# Patient Record
Sex: Male | Born: 1960 | Race: Black or African American | Hispanic: No | Marital: Single | Smoking: Current every day smoker
Health system: Southern US, Community
[De-identification: ages and names within clinical notes are randomized; demographics above are authoritative.]

## PROBLEM LIST (undated history)

## (undated) ENCOUNTER — Emergency Department (HOSPITAL_COMMUNITY): Admission: EM | Payer: Self-pay

## (undated) DIAGNOSIS — Q159 Congenital malformation of eye, unspecified: Secondary | ICD-10-CM

## (undated) DIAGNOSIS — G709 Myoneural disorder, unspecified: Secondary | ICD-10-CM

## (undated) DIAGNOSIS — I10 Essential (primary) hypertension: Secondary | ICD-10-CM

## (undated) DIAGNOSIS — Z9181 History of falling: Secondary | ICD-10-CM

## (undated) DIAGNOSIS — K219 Gastro-esophageal reflux disease without esophagitis: Secondary | ICD-10-CM

## (undated) DIAGNOSIS — F1010 Alcohol abuse, uncomplicated: Secondary | ICD-10-CM

## (undated) DIAGNOSIS — R569 Unspecified convulsions: Secondary | ICD-10-CM

## (undated) HISTORY — PX: NO PAST SURGERIES: SHX2092

## (undated) HISTORY — DX: History of falling: Z91.81

## (undated) HISTORY — DX: Myoneural disorder, unspecified: G70.9

## (undated) HISTORY — DX: Gastro-esophageal reflux disease without esophagitis: K21.9

---

## 1997-12-01 ENCOUNTER — Emergency Department (HOSPITAL_COMMUNITY): Admission: EM | Admit: 1997-12-01 | Discharge: 1997-12-01 | Payer: Self-pay | Admitting: Emergency Medicine

## 1997-12-15 ENCOUNTER — Emergency Department (HOSPITAL_COMMUNITY): Admission: EM | Admit: 1997-12-15 | Discharge: 1997-12-15 | Payer: Self-pay | Admitting: Emergency Medicine

## 1998-03-16 ENCOUNTER — Emergency Department (HOSPITAL_COMMUNITY): Admission: EM | Admit: 1998-03-16 | Discharge: 1998-03-16 | Payer: Self-pay | Admitting: Emergency Medicine

## 1998-05-10 ENCOUNTER — Emergency Department (HOSPITAL_COMMUNITY): Admission: EM | Admit: 1998-05-10 | Discharge: 1998-05-10 | Payer: Self-pay | Admitting: *Deleted

## 1998-08-15 ENCOUNTER — Emergency Department (HOSPITAL_COMMUNITY): Admission: EM | Admit: 1998-08-15 | Discharge: 1998-08-15 | Payer: Self-pay | Admitting: Emergency Medicine

## 2001-02-04 ENCOUNTER — Emergency Department (HOSPITAL_COMMUNITY): Admission: EM | Admit: 2001-02-04 | Discharge: 2001-02-04 | Payer: Self-pay | Admitting: Emergency Medicine

## 2001-02-04 ENCOUNTER — Encounter: Payer: Self-pay | Admitting: Emergency Medicine

## 2002-01-12 ENCOUNTER — Emergency Department (HOSPITAL_COMMUNITY): Admission: EM | Admit: 2002-01-12 | Discharge: 2002-01-12 | Payer: Self-pay

## 2002-12-01 ENCOUNTER — Emergency Department (HOSPITAL_COMMUNITY): Admission: EM | Admit: 2002-12-01 | Discharge: 2002-12-01 | Payer: Self-pay | Admitting: Emergency Medicine

## 2003-04-26 ENCOUNTER — Emergency Department (HOSPITAL_COMMUNITY): Admission: EM | Admit: 2003-04-26 | Discharge: 2003-04-26 | Payer: Self-pay | Admitting: *Deleted

## 2004-08-23 ENCOUNTER — Emergency Department (HOSPITAL_COMMUNITY): Admission: EM | Admit: 2004-08-23 | Discharge: 2004-08-23 | Payer: Self-pay | Admitting: Family Medicine

## 2004-11-01 ENCOUNTER — Emergency Department (HOSPITAL_COMMUNITY): Admission: EM | Admit: 2004-11-01 | Discharge: 2004-11-01 | Payer: Self-pay | Admitting: Emergency Medicine

## 2005-11-14 ENCOUNTER — Emergency Department (HOSPITAL_COMMUNITY): Admission: EM | Admit: 2005-11-14 | Discharge: 2005-11-14 | Payer: Self-pay | Admitting: Emergency Medicine

## 2006-11-03 ENCOUNTER — Ambulatory Visit: Payer: Self-pay | Admitting: Internal Medicine

## 2006-11-04 ENCOUNTER — Ambulatory Visit: Payer: Self-pay | Admitting: *Deleted

## 2007-03-26 ENCOUNTER — Emergency Department (HOSPITAL_COMMUNITY): Admission: EM | Admit: 2007-03-26 | Discharge: 2007-03-26 | Payer: Self-pay | Admitting: Family Medicine

## 2007-06-08 ENCOUNTER — Ambulatory Visit: Payer: Self-pay | Admitting: Internal Medicine

## 2007-06-11 ENCOUNTER — Ambulatory Visit (HOSPITAL_COMMUNITY): Admission: RE | Admit: 2007-06-11 | Discharge: 2007-06-11 | Payer: Self-pay | Admitting: Internal Medicine

## 2007-06-14 ENCOUNTER — Ambulatory Visit: Payer: Self-pay | Admitting: Internal Medicine

## 2007-06-14 LAB — CONVERTED CEMR LAB
BUN: 6 mg/dL (ref 6–23)
Calcium: 8.5 mg/dL (ref 8.4–10.5)
Cholesterol: 153 mg/dL (ref 0–200)
Creatinine, Ser: 0.71 mg/dL (ref 0.40–1.50)
Glucose, Bld: 87 mg/dL (ref 70–99)
Potassium: 3.4 meq/L — ABNORMAL LOW (ref 3.5–5.3)
Total CHOL/HDL Ratio: 3.8
Triglycerides: 135 mg/dL (ref <150)
VLDL: 27 mg/dL (ref 0–40)

## 2007-10-27 ENCOUNTER — Ambulatory Visit: Payer: Self-pay | Admitting: Internal Medicine

## 2008-04-04 ENCOUNTER — Ambulatory Visit: Payer: Self-pay | Admitting: Internal Medicine

## 2008-07-08 ENCOUNTER — Emergency Department (HOSPITAL_COMMUNITY): Admission: EM | Admit: 2008-07-08 | Discharge: 2008-07-08 | Payer: Self-pay | Admitting: Emergency Medicine

## 2008-12-19 ENCOUNTER — Ambulatory Visit: Payer: Self-pay | Admitting: Internal Medicine

## 2009-01-16 ENCOUNTER — Ambulatory Visit: Payer: Self-pay | Admitting: Internal Medicine

## 2009-01-21 ENCOUNTER — Ambulatory Visit (HOSPITAL_COMMUNITY): Admission: RE | Admit: 2009-01-21 | Discharge: 2009-01-21 | Payer: Self-pay | Admitting: Internal Medicine

## 2009-01-28 ENCOUNTER — Ambulatory Visit: Payer: Self-pay | Admitting: Internal Medicine

## 2009-02-04 ENCOUNTER — Ambulatory Visit: Payer: Self-pay | Admitting: Family Medicine

## 2009-03-08 ENCOUNTER — Ambulatory Visit (HOSPITAL_COMMUNITY): Admission: RE | Admit: 2009-03-08 | Discharge: 2009-03-08 | Payer: Self-pay | Admitting: Internal Medicine

## 2009-03-13 ENCOUNTER — Ambulatory Visit: Payer: Self-pay | Admitting: Family Medicine

## 2009-05-10 ENCOUNTER — Ambulatory Visit: Payer: Self-pay | Admitting: Internal Medicine

## 2009-06-27 ENCOUNTER — Ambulatory Visit: Payer: Self-pay | Admitting: Family Medicine

## 2009-06-28 ENCOUNTER — Encounter (INDEPENDENT_AMBULATORY_CARE_PROVIDER_SITE_OTHER): Payer: Self-pay | Admitting: Internal Medicine

## 2009-06-28 LAB — CONVERTED CEMR LAB
BUN: 11 mg/dL (ref 6–23)
Calcium: 9.6 mg/dL (ref 8.4–10.5)
Chloride: 95 meq/L — ABNORMAL LOW (ref 96–112)
Creatinine, Ser: 0.85 mg/dL (ref 0.40–1.50)
LDL Cholesterol: 116 mg/dL — ABNORMAL HIGH (ref 0–99)
Potassium: 4.4 meq/L (ref 3.5–5.3)
VLDL: 18 mg/dL (ref 0–40)

## 2009-08-14 ENCOUNTER — Ambulatory Visit: Payer: Self-pay | Admitting: Internal Medicine

## 2009-08-14 LAB — CONVERTED CEMR LAB
Basophils Absolute: 0.1 10*3/uL (ref 0.0–0.1)
Basophils Relative: 3 % — ABNORMAL HIGH (ref 0–1)
Eosinophils Relative: 3 % (ref 0–5)
HCT: 43.1 % (ref 39.0–52.0)
Lymphocytes Relative: 44 % (ref 12–46)
MCHC: 32.5 g/dL (ref 30.0–36.0)
Monocytes Absolute: 0.5 10*3/uL (ref 0.1–1.0)
Neutro Abs: 2.3 10*3/uL (ref 1.7–7.7)
RBC: 4.52 M/uL (ref 4.22–5.81)
WBC: 5.6 10*3/uL (ref 4.0–10.5)

## 2009-10-22 ENCOUNTER — Telehealth (INDEPENDENT_AMBULATORY_CARE_PROVIDER_SITE_OTHER): Payer: Self-pay | Admitting: *Deleted

## 2010-01-02 ENCOUNTER — Emergency Department (HOSPITAL_COMMUNITY): Admission: EM | Admit: 2010-01-02 | Discharge: 2010-01-02 | Payer: Self-pay | Admitting: Emergency Medicine

## 2010-07-25 ENCOUNTER — Emergency Department (HOSPITAL_COMMUNITY)
Admission: EM | Admit: 2010-07-25 | Discharge: 2010-07-25 | Payer: Self-pay | Source: Home / Self Care | Admitting: Emergency Medicine

## 2010-09-23 NOTE — Progress Notes (Signed)
Summary: triage-request assistance with glasses  Phone Note Call from Patient   Details for Reason: calling to request assistance with glasses. Has a paper to get an eye exam that he was told to bring to [facility] last month. Advised that we are not able to provide eye exams at this time so unsure what he was directed to provide. Advised we are able to provide pictures of the back of patient's eyes through our Ret[employer] camera, however this does not help with vision. Advised to bring paper up here and would be happy to look at it however, I am not clear what he is referencing over the phone.  Initial call taken by: Blondell Reveal RN,  October 22, 2009 11:54 AM

## 2010-11-22 ENCOUNTER — Emergency Department (HOSPITAL_COMMUNITY)
Admission: EM | Admit: 2010-11-22 | Discharge: 2010-11-22 | Disposition: A | Discharge status: Home / Self Care | Payer: Self-pay | Attending: Emergency Medicine | Admitting: Emergency Medicine

## 2010-11-22 DIAGNOSIS — M25569 Pain in unspecified knee: Secondary | ICD-10-CM | POA: Insufficient documentation

## 2010-11-22 DIAGNOSIS — IMO0002 Reserved for concepts with insufficient information to code with codable children: Secondary | ICD-10-CM | POA: Insufficient documentation

## 2010-11-22 DIAGNOSIS — M1710 Unilateral primary osteoarthritis, unspecified knee: Secondary | ICD-10-CM | POA: Insufficient documentation

## 2011-05-27 LAB — COMPREHENSIVE METABOLIC PANEL
ALT: 72 — ABNORMAL HIGH
Albumin: 4.1
Alkaline Phosphatase: 102
BUN: 6
CO2: 30
Chloride: 106
Creatinine, Ser: 0.82
Glucose, Bld: 128 — ABNORMAL HIGH
Total Protein: 8.5 — ABNORMAL HIGH

## 2011-05-27 LAB — DIFFERENTIAL
Basophils Relative: 1
Eosinophils Absolute: 0.1
Lymphs Abs: 2.6
Monocytes Relative: 11
Neutro Abs: 3.6
Neutrophils Relative %: 51

## 2011-05-27 LAB — CBC
HCT: 48.4
MCHC: 33.1
RBC: 5.29
RDW: 13.9
WBC: 7.1

## 2011-05-27 LAB — RAPID URINE DRUG SCREEN, HOSP PERFORMED
Barbiturates: NOT DETECTED
Benzodiazepines: NOT DETECTED
Cocaine: NOT DETECTED
Opiates: NOT DETECTED

## 2011-05-27 LAB — URINALYSIS, ROUTINE W REFLEX MICROSCOPIC
Bilirubin Urine: NEGATIVE
Ketones, ur: NEGATIVE
Nitrite: NEGATIVE

## 2011-05-27 LAB — ETHANOL: Alcohol, Ethyl (B): 516

## 2011-06-26 ENCOUNTER — Emergency Department (HOSPITAL_COMMUNITY): Payer: Self-pay

## 2011-06-26 ENCOUNTER — Emergency Department (HOSPITAL_COMMUNITY)
Admission: EM | Admit: 2011-06-26 | Discharge: 2011-06-26 | Disposition: A | Discharge status: Home / Self Care | Payer: Self-pay | Attending: Emergency Medicine | Admitting: Emergency Medicine

## 2011-06-26 DIAGNOSIS — R220 Localized swelling, mass and lump, head: Secondary | ICD-10-CM | POA: Insufficient documentation

## 2011-06-26 DIAGNOSIS — M5030 Other cervical disc degeneration, unspecified cervical region: Secondary | ICD-10-CM | POA: Insufficient documentation

## 2011-06-26 DIAGNOSIS — T1490XA Injury, unspecified, initial encounter: Secondary | ICD-10-CM | POA: Insufficient documentation

## 2011-06-26 DIAGNOSIS — M25569 Pain in unspecified knee: Secondary | ICD-10-CM | POA: Insufficient documentation

## 2011-06-26 DIAGNOSIS — S1093XA Contusion of unspecified part of neck, initial encounter: Secondary | ICD-10-CM | POA: Insufficient documentation

## 2011-06-26 DIAGNOSIS — W010XXA Fall on same level from slipping, tripping and stumbling without subsequent striking against object, initial encounter: Secondary | ICD-10-CM | POA: Insufficient documentation

## 2011-06-26 DIAGNOSIS — S025XXA Fracture of tooth (traumatic), initial encounter for closed fracture: Secondary | ICD-10-CM | POA: Insufficient documentation

## 2011-06-26 DIAGNOSIS — S0003XA Contusion of scalp, initial encounter: Secondary | ICD-10-CM | POA: Insufficient documentation

## 2011-06-26 DIAGNOSIS — F1010 Alcohol abuse, uncomplicated: Secondary | ICD-10-CM | POA: Insufficient documentation

## 2011-06-26 LAB — DIFFERENTIAL
Basophils Absolute: 0 10*3/uL (ref 0.0–0.1)
Lymphocytes Relative: 32 % (ref 12–46)
Lymphs Abs: 1.4 10*3/uL (ref 0.7–4.0)
Monocytes Absolute: 0.4 10*3/uL (ref 0.1–1.0)
Monocytes Relative: 9 % (ref 3–12)
Neutro Abs: 2.4 10*3/uL (ref 1.7–7.7)

## 2011-06-26 LAB — BASIC METABOLIC PANEL
Calcium: 9.2 mg/dL (ref 8.4–10.5)
GFR calc non Af Amer: >90 mL/min (ref >90)
Potassium: 3.2 mEq/L — ABNORMAL LOW (ref 3.5–5.1)
Sodium: 139 mEq/L (ref 135–145)

## 2011-06-26 LAB — CBC
HCT: 38.4 % — ABNORMAL LOW (ref 39.0–52.0)
Hemoglobin: 12.9 g/dL — ABNORMAL LOW (ref 13.0–17.0)
MCH: 30.9 pg (ref 26.0–34.0)
RBC: 4.17 MIL/uL — ABNORMAL LOW (ref 4.22–5.81)
RDW: 14.7 % (ref 11.5–15.5)
WBC: 4.3 10*3/uL (ref 4.0–10.5)

## 2011-06-26 LAB — ETHANOL: Alcohol, Ethyl (B): 490 mg/dL (ref 0–11)

## 2012-03-10 ENCOUNTER — Emergency Department (HOSPITAL_COMMUNITY): Payer: Self-pay

## 2012-03-10 ENCOUNTER — Encounter (HOSPITAL_COMMUNITY): Payer: Self-pay | Admitting: Emergency Medicine

## 2012-03-10 ENCOUNTER — Emergency Department (HOSPITAL_COMMUNITY)
Admission: EM | Admit: 2012-03-10 | Discharge: 2012-03-10 | Disposition: A | Discharge status: Home / Self Care | Payer: Self-pay | Attending: Emergency Medicine | Admitting: Emergency Medicine

## 2012-03-10 DIAGNOSIS — R079 Chest pain, unspecified: Secondary | ICD-10-CM

## 2012-03-10 DIAGNOSIS — F10929 Alcohol use, unspecified with intoxication, unspecified: Secondary | ICD-10-CM

## 2012-03-10 DIAGNOSIS — F1010 Alcohol abuse, uncomplicated: Secondary | ICD-10-CM | POA: Insufficient documentation

## 2012-03-10 LAB — COMPREHENSIVE METABOLIC PANEL
ALT: 43 U/L (ref 0–53)
AST: 111 U/L — ABNORMAL HIGH (ref 0–37)
CO2: 24 mEq/L (ref 19–32)
Chloride: 102 mEq/L (ref 96–112)
GFR calc non Af Amer: >90 mL/min (ref >90)
Sodium: 139 mEq/L (ref 135–145)
Total Bilirubin: 0.5 mg/dL (ref 0.3–1.2)

## 2012-03-10 LAB — CBC WITH DIFFERENTIAL/PLATELET
Basophils Absolute: 0.1 10*3/uL (ref 0.0–0.1)
Lymphocytes Relative: 41 % (ref 12–46)
Neutro Abs: 2.1 10*3/uL (ref 1.7–7.7)
Platelets: 120 10*3/uL — ABNORMAL LOW (ref 150–400)
RDW: 13.9 % (ref 11.5–15.5)
WBC: 4.5 10*3/uL (ref 4.0–10.5)

## 2012-03-10 LAB — RAPID URINE DRUG SCREEN, HOSP PERFORMED
Amphetamines: NOT DETECTED
Benzodiazepines: NOT DETECTED

## 2012-03-10 LAB — ETHANOL: Alcohol, Ethyl (B): 368 mg/dL — ABNORMAL HIGH (ref 0–11)

## 2012-03-10 MED ORDER — SODIUM CHLORIDE 0.9 % IV SOLN
INTRAVENOUS | Status: DC
  Administered 2012-03-10 (×2): via INTRAVENOUS

## 2012-03-10 MED ORDER — ALBUTEROL SULFATE (5 MG/ML) 0.5% IN NEBU
5.0000 mg | INHALATION_SOLUTION | Freq: Once | RESPIRATORY_TRACT | Status: AC
  Administered 2012-03-10: 5 mg via RESPIRATORY_TRACT
  Filled 2012-03-10: qty 1

## 2012-03-10 NOTE — ED Notes (Signed)
Patient refused to wait to have discharged vitals taken.

## 2012-03-10 NOTE — ED Provider Notes (Signed)
History     CSN: [id number]  Arrival date & time 03/10/12  1718   First MD Initiated Contact with Patient 03/10/12 1732      Chief Complaint  Patient presents with  . Chest Pain    (Consider location/radiation/quality/duration/timing/severity/associated sxs/prior treatment) Patient is a 51 y.o. male presenting with chest pain. The history is provided by the patient.  Chest Pain    patient states he's had some left-sided chest pain on and off for last 7 days. He states it comes on when he gets hiccups. He states he has had hiccups for most of the last 7 days. He states he has been drinking today. He states he's just been passing around other people. States he drinks every day. He does smoke, but denies other drugs. No fevers. No cough. No abdominal pain. No previous cardiac history per patient. No nausea vomiting or diarrhea. He has not had pains like this before.  History reviewed. No pertinent past medical history.  History reviewed. No pertinent past surgical history.  No family history on file.  History  Substance Use Topics  . Smoking status: Not on file  . Smokeless tobacco: Not on file  . Alcohol Use: Not on file      Review of Systems  Constitutional: Negative for chills.  Respiratory: Negative for choking and chest tightness.   Cardiovascular: Positive for chest pain.  Genitourinary: Negative for dysuria.  Musculoskeletal: Negative for back pain.  Neurological: Negative for headaches.    Allergies  Review of patient's allergies indicates no known allergies.  Home Medications  No current outpatient prescriptions on file.  BP 122/84  Pulse 93  Temp 98.1 F (36.7 C) (Oral)  Resp 17  Ht 5\' 11"  (1.803 m)  Wt 180 lb (81.647 kg)  BMI 25.10 kg/m2  SpO2 98%  Physical Exam  Nursing note and vitals reviewed. Constitutional: He is oriented to person, place, and time. He appears well-developed and well-nourished.       Patient smells of alcohol and appears  somewhat intoxicated  HENT:  Head: Normocephalic and atraumatic.  Eyes: Pupils are equal, round, and reactive to light.       Patient has mild disconjugate gaze which patient states is chronic. Eyes are bloodshot.  Neck: Normal range of motion. Neck supple.  Cardiovascular: Normal rate, regular rhythm and normal heart sounds.   No murmur heard. Pulmonary/Chest: Effort normal and breath sounds normal.  Abdominal: Soft. Bowel sounds are normal. He exhibits no distension and no mass. There is no tenderness. There is no rebound and no guarding.  Musculoskeletal: Normal range of motion. He exhibits no edema.  Neurological: He is alert and oriented to person, place, and time. No cranial nerve deficit.  Skin: Skin is warm and dry.  Psychiatric: He has a normal mood and affect.    ED Course  Procedures (including critical care time)  Labs Reviewed  CBC WITH DIFFERENTIAL - Abnormal; Notable for the following:    RBC 3.98 (*)     Hemoglobin 12.6 (*)     HCT 37.1 (*)     Platelets 120 (*)     All other components within normal limits  COMPREHENSIVE METABOLIC PANEL - Abnormal; Notable for the following:    Potassium 3.3 (*)     Glucose, Bld 101 (*)     BUN 3 (*)     Albumin 3.4 (*)     AST 111 (*)     All other components within normal limits  ETHANOL - Abnormal; Notable for the following:    Alcohol, Ethyl (B) 368 (*)     All other components within normal limits  LIPASE, BLOOD  TROPONIN I  URINE RAPID DRUG SCREEN (HOSP PERFORMED)   Dg Chest 2 View  03/10/2012  *RADIOLOGY REPORT*  Clinical Data: Chest pain.  CHEST - 2 VIEW  Comparison: 07/08/2008 chest radiograph  Findings: The cardiomediastinal silhouette is unremarkable. The lungs are clear. There is no evidence of focal airspace disease, pulmonary edema, suspicious pulmonary nodule/mass, pleural effusion, or pneumothorax. No acute bony abnormalities are identified.  IMPRESSION: No evidence of active cardiopulmonary disease.  Original  Report Authenticated By: Rosendo Gros, M.D.     1. Chest pain   2. Alcohol intoxication      Date: 03/10/2012  Rate: 94  Rhythm: indeterminate and ectopic atrial vs junctional  QRS Axis: normal  Intervals: normal  ST/T Wave abnormalities: normal  Conduction Disutrbances:none  Narrative Interpretation:   Old EKG Reviewed: changes noted    MDM  Patient with chest pain alcohol intoxication. He's had hiccups for the last 7 days. Initial EKG and labwork are overall reassuring for cardiac cause, however the patient would not stay for further workup. 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 He'll be discharged home he was informed that he can return at any time for further evaluation.        Juliet Rude. Rubin Payor, MD 03/10/12 2356

## 2012-03-10 NOTE — Discharge Instructions (Signed)
Alcohol Intoxication  You have alcohol intoxication when the amount of alcohol that you have consumed has impaired your ability to mentally and physically function. There are a variety of factors that contribute to the level at which alcohol intoxication can occur, such as age, gender, weight, frequency of alcohol consumption, medication use, and the presence of other medical conditions, such as diabetes, seizures, or heart conditions.  The blood alcohol level test measures the concentration of alcohol in your blood. In most states, your blood alcohol level must be lower than 80 mg/dL (0.08%) to legally drive. However, many dangerous effects of alcohol can occur at much lower levels.  Alcohol directly impairs the normal chemical activity of the brain and is said to be a chemical depressant. Alcohol can cause drowsiness, stupor, respiratory failure, and coma. Other physical effects can include headache, vomiting, vomiting of blood, abdominal pain, a fast heartbeat, difficulty breathing, anxiety, and amnesia. Alcohol intoxication can also lead to dangerous and life-threatening activities, such as fighting, dangerous operation of vehicles or heavy machinery, and risky sexual behavior.  Alcohol can be especially dangerous when taken with other drugs. Some of these drugs are:   Sedatives.   Painkillers.   Marijuana.   Tranquilizers.   Antihistamines.   Muscle relaxants.   Seizure medicine.  Many of the effects of acute alcohol intoxication are temporary. However, repeated alcohol intoxication can lead to severe medical illnesses. If you have alcohol intoxication, you should:   Stay hydrated. Drink enough water and fluids to keep your urine clear or pale yellow. Avoid excessive caffeine because this can further lead to dehydration.   Eat a healthy diet. You may have residual nausea, headache, and loss of appetite, but it is still important that you maintain good nutrition. You can start with clear  liquids.   Take nonsteroidal anti-inflammatory medications as needed for headaches, but make sure to do so with small meals. You should avoid acetaminophen for several days after having alcohol intoxication because the combination of alcohol and acetaminophen can be toxic to your liver.  If you have frequent alcohol intoxication, ask your friends and family if they think you have a drinking problem. For further help, contact:   Your caregiver.   Alcoholics Anonymous (AA).   A drug or alcohol rehabilitation program.  SEEK MEDICAL CARE IF:    You have persistent vomiting.   You have persistent pain in any part of your body.   You do not feel better after a few days.  SEEK IMMEDIATE MEDICAL CARE IF:    You become shaky or tremble when you try to stop drinking.   You shake uncontrollably (seizure).   You throw up (vomit) blood. This may be bright red or it may look like black coffee grounds.   You have blood in the stool. This may be bright red or appear as a black, tarry, bad smelling stool.   You become lightheaded or faint.  ANY OF THESE SYMPTOMS MAY REPRESENT A SERIOUS PROBLEM THAT IS AN EMERGENCY. Do not wait to see if the symptoms will go away. Get medical help right away. Call your local emergency services (911 in [place] DO NOT drive yourself to the hospital.  MAKE SURE YOU:    Understand these instructions.   Will watch your condition.   Will get help right away if you are not doing well or get worse.  Document Released: 05/20/2005 Document Revised: 07/30/2011 Document Reviewed: 01/27/2010  [employer] Patient Information 2012 [employer].

## 2012-03-10 NOTE — ED Notes (Signed)
Pt in via EMS for c/o hiccups and chest pain x7 days. Pt admits to ETOH today and daily. Today he states he has drank "2 40's and a fifth of [employer] [facility] gave 324mg  ASA and SL nitro x1.

## 2012-03-10 NOTE — ED Notes (Signed)
Pt states he is ready to go home. States he feels well and does not want to stay any longer that is ride is already on its way to pick him up. Alertx4 NAD.

## 2012-03-29 ENCOUNTER — Encounter (HOSPITAL_COMMUNITY): Payer: Self-pay | Admitting: Emergency Medicine

## 2012-03-29 ENCOUNTER — Emergency Department (HOSPITAL_COMMUNITY)
Admission: EM | Admit: 2012-03-29 | Discharge: 2012-03-29 | Disposition: A | Discharge status: Home / Self Care | Payer: Self-pay | Attending: Emergency Medicine | Admitting: Emergency Medicine

## 2012-03-29 DIAGNOSIS — F1010 Alcohol abuse, uncomplicated: Secondary | ICD-10-CM | POA: Insufficient documentation

## 2012-03-29 DIAGNOSIS — R109 Unspecified abdominal pain: Secondary | ICD-10-CM | POA: Insufficient documentation

## 2012-03-29 DIAGNOSIS — R066 Hiccough: Secondary | ICD-10-CM | POA: Insufficient documentation

## 2012-03-29 LAB — CBC WITH DIFFERENTIAL/PLATELET
Hemoglobin: 14.4 g/dL (ref 13.0–17.0)
Lymphocytes Relative: 40 % (ref 12–46)
Lymphs Abs: 2.4 10*3/uL (ref 0.7–4.0)
MCH: 32 pg (ref 26.0–34.0)
Monocytes Relative: 9 % (ref 3–12)
Neutro Abs: 3 10*3/uL (ref 1.7–7.7)
Neutrophils Relative %: 49 % (ref 43–77)
RBC: 4.5 MIL/uL (ref 4.22–5.81)
WBC: 6.1 10*3/uL (ref 4.0–10.5)

## 2012-03-29 LAB — ETHANOL: Alcohol, Ethyl (B): 433 mg/dL (ref 0–11)

## 2012-03-29 LAB — COMPREHENSIVE METABOLIC PANEL
AST: 163 U/L — ABNORMAL HIGH (ref 0–37)
CO2: 32 mEq/L (ref 19–32)
Calcium: 9.4 mg/dL (ref 8.4–10.5)
Creatinine, Ser: 0.78 mg/dL (ref 0.50–1.35)
GFR calc Af Amer: >90 mL/min (ref >90)
GFR calc non Af Amer: >90 mL/min (ref >90)

## 2012-03-29 NOTE — ED Notes (Signed)
PT. ARRIVED WITH EMS FROM HOME REPORTS ETOH INTOXICATION ( DRINKING ALL DAY) WITH PERSISTENT HICCUPS AND UPPER ABDOMINAL PAIN FOR 2 MONTHS. SLIGHT NAUSEA.  POOR HISTORIAN  DUE TO ETOH INTOXICATION .

## 2012-03-29 NOTE — ED Notes (Addendum)
Pt stated he was leaving and his niece was coming to pick pt up. Advised pt not to drive car. Advised pt not to leave without seeing a MD, pt denied request. Attempted to get pt to sign LWBS, pt signed wrong box. Pt informed of dangers of leaving without being seen

## 2012-11-24 ENCOUNTER — Encounter (HOSPITAL_COMMUNITY): Payer: Self-pay | Admitting: *Deleted

## 2012-11-24 ENCOUNTER — Emergency Department (HOSPITAL_COMMUNITY)
Admission: EM | Admit: 2012-11-24 | Discharge: 2012-11-25 | Disposition: A | Discharge status: Home / Self Care | Payer: Self-pay | Attending: Emergency Medicine | Admitting: Emergency Medicine

## 2012-11-24 DIAGNOSIS — F411 Generalized anxiety disorder: Secondary | ICD-10-CM | POA: Insufficient documentation

## 2012-11-24 DIAGNOSIS — F10129 Alcohol abuse with intoxication, unspecified: Secondary | ICD-10-CM

## 2012-11-24 DIAGNOSIS — F17200 Nicotine dependence, unspecified, uncomplicated: Secondary | ICD-10-CM | POA: Insufficient documentation

## 2012-11-24 DIAGNOSIS — F1020 Alcohol dependence, uncomplicated: Secondary | ICD-10-CM | POA: Insufficient documentation

## 2012-11-24 DIAGNOSIS — R55 Syncope and collapse: Secondary | ICD-10-CM | POA: Insufficient documentation

## 2012-11-24 DIAGNOSIS — R32 Unspecified urinary incontinence: Secondary | ICD-10-CM | POA: Insufficient documentation

## 2012-11-24 HISTORY — DX: Alcohol abuse, uncomplicated: F10.10

## 2012-11-24 LAB — CBC WITH DIFFERENTIAL/PLATELET
Eosinophils Relative: 2 % (ref 0–5)
HCT: 43.6 % (ref 39.0–52.0)
Hemoglobin: 15.3 g/dL (ref 13.0–17.0)
Lymphocytes Relative: 54 % — ABNORMAL HIGH (ref 12–46)
Lymphs Abs: 2.1 10*3/uL (ref 0.7–4.0)
MCV: 90.5 fL (ref 78.0–100.0)
Monocytes Absolute: 0.3 10*3/uL (ref 0.1–1.0)
Neutro Abs: 1.4 10*3/uL — ABNORMAL LOW (ref 1.7–7.7)
RBC: 4.82 MIL/uL (ref 4.22–5.81)
WBC: 3.8 10*3/uL — ABNORMAL LOW (ref 4.0–10.5)

## 2012-11-24 LAB — COMPREHENSIVE METABOLIC PANEL
ALT: 23 U/L (ref 0–53)
CO2: 27 mEq/L (ref 19–32)
Calcium: 8.9 mg/dL (ref 8.4–10.5)
Chloride: 102 mEq/L (ref 96–112)
Creatinine, Ser: 0.88 mg/dL (ref 0.50–1.35)
GFR calc Af Amer: >90 mL/min (ref >90)
GFR calc non Af Amer: >90 mL/min (ref >90)
Glucose, Bld: 95 mg/dL (ref 70–99)
Total Bilirubin: 0.4 mg/dL (ref 0.3–1.2)

## 2012-11-24 LAB — ETHANOL: Alcohol, Ethyl (B): 465 mg/dL (ref 0–11)

## 2012-11-24 LAB — RAPID URINE DRUG SCREEN, HOSP PERFORMED
Barbiturates: NOT DETECTED
Benzodiazepines: NOT DETECTED
Cocaine: NOT DETECTED

## 2012-11-24 LAB — LIPASE, BLOOD: Lipase: 43 U/L (ref 11–59)

## 2012-11-24 MED ORDER — THIAMINE HCL 100 MG/ML IJ SOLN
Freq: Once | INTRAVENOUS | Status: AC
  Administered 2012-11-24: 16:00:00 via INTRAVENOUS
  Filled 2012-11-24: qty 1000

## 2012-11-24 NOTE — ED Notes (Signed)
Blinda Leatherwood, MD and Misty Stanley, [place] notified of ETOH level.

## 2012-11-24 NOTE — ED Notes (Signed)
EMS called for seizure-like activity and urinary incontinence.  Pt intoxicated and states he drank a lot.  No seizure activity noted my ems.  Pt is not post-ictal.

## 2012-11-24 NOTE — ED Notes (Signed)
Pt. Is intoxicated and is sleeping at the present time, Awakens and answers questions.  Airway patent.

## 2012-11-24 NOTE — ED Notes (Signed)
Pt telling nurse "I need you to take this IV out so I can go home, I'm ready to go home". RN informed pt that he will be discharged in the morning after labs have been drawn and resulted. Will notify MD

## 2012-11-24 NOTE — ED Notes (Signed)
Pt made aware that the hospital would not be allowed to leave facility at this time due to his ETOH level.  Pt acknowledged this and stated he would be "ok" until the morning.

## 2012-11-24 NOTE — ED Provider Notes (Signed)
History     CSN: [id number]  Arrival date & time 11/24/12  1238   First MD Initiated Contact with Patient 11/24/12 1323      Chief Complaint  Patient presents with  . Alcohol Intoxication    (Consider location/radiation/quality/duration/timing/severity/associated sxs/prior treatment) HPI Comments: Pt presents to the ED via EMS for seizure like activity and urinary incontinence.  Pt states he was drinking a large amount of beer and wine last night into this morning at his home when he suddenly collapsed in the yard.  Neighbors witnessed the fall and called EMS.  Pt admits that he has been drinking for many years following the deaths of his mother and sisters.  Denies illicit drug use.  Denies SI/HI.  Would like help with sobriety but does not know how to achieve this.  Notes he does not have a family support system at home.  No hx of seizure disorder.  Patient is a 52 y.o. male presenting with intoxication. The history is provided by the patient.  Alcohol Intoxication    Past Medical History  Diagnosis Date  . ETOH abuse     History reviewed. No pertinent past surgical history.  No family history on file.  History  Substance Use Topics  . Smoking status: Current Every Day Smoker -- 0.25 packs/day    Types: Cigarettes  . Smokeless tobacco: Not on file  . Alcohol Use: Yes      Review of Systems  Genitourinary: Positive for enuresis.  Neurological: Positive for syncope.  All other systems reviewed and are negative.    Allergies  Review of patient's allergies indicates no known allergies.  Home Medications  No current outpatient prescriptions on file.  BP 136/96  Pulse 102  Temp(Src) 98 F (36.7 C) (Oral)  Resp 20  SpO2 97%  Physical Exam  Nursing note and vitals reviewed. Constitutional: He is oriented to person, place, and time. He appears well-developed and well-nourished.  HENT:  Head: Normocephalic and atraumatic.  Mouth/Throat: Oropharynx is clear and  moist.  Eyes: Conjunctivae and EOM are normal. Pupils are equal, round, and reactive to light.  Neck: Normal range of motion. Neck supple.  Cardiovascular: Normal rate, regular rhythm and normal heart sounds.   Pulmonary/Chest: Effort normal and breath sounds normal. He has no wheezes.  Abdominal: Soft. Bowel sounds are normal. There is no tenderness. There is no guarding.  Musculoskeletal: Normal range of motion. He exhibits no edema.  Neurological: He is alert and oriented to person, place, and time.  Skin: Skin is warm and dry.  Psychiatric: His mood appears anxious. He expresses no homicidal and no suicidal ideation.  tearful    ED Course  Procedures (including critical care time)  Labs Reviewed  CBC WITH DIFFERENTIAL - Abnormal; Notable for the following:    WBC 3.8 (*)    Platelets 115 (*)    Neutrophils Relative 36 (*)    Neutro Abs 1.4 (*)    Lymphocytes Relative 54 (*)    All other components within normal limits  COMPREHENSIVE METABOLIC PANEL - Abnormal; Notable for the following:    Potassium 3.4 (*)    BUN 3 (*)    Albumin 3.4 (*)    AST 70 (*)    All other components within normal limits  ETHANOL - Abnormal; Notable for the following:    Alcohol, Ethyl (B) 465 (*)    All other components within normal limits  ETHANOL - Abnormal; Notable for the following:    Alcohol,  Ethyl (B) 166 (*)    All other components within normal limits  URINE RAPID DRUG SCREEN (HOSP PERFORMED)  ACETAMINOPHEN LEVEL  LIPASE, BLOOD   No results found.   1. Very severe alcohol intoxication       MDM   EtOH 465.  All other labs largely normal.  Pt requesting detox.  Will allow to sobriety and re-evaluate.  If still requesting detox, will consult [facility].  Banana bag ordered and CIWA protocol established.  Moved to Pod C.        Garlon Hatchet, PA-C 11/25/12 1233

## 2012-11-24 NOTE — ED Notes (Signed)
Pt upset that he is not allowed to go home.  Pt given axplanation as to why he is not allowed to leave and off duty [employer] officer in room also explaining process to pt

## 2012-11-25 MED ORDER — LORAZEPAM 2 MG/ML IJ SOLN: 1.0000 mg | Freq: Four times a day (QID) | INTRAMUSCULAR | Status: DC | PRN

## 2012-11-25 MED ORDER — LORAZEPAM 1 MG PO TABS: 1.0000 mg | ORAL_TABLET | Freq: Four times a day (QID) | ORAL | Status: DC | PRN

## 2012-11-25 MED ORDER — VITAMIN B-1 100 MG PO TABS: 100.0000 mg | ORAL_TABLET | Freq: Every day | ORAL | Status: DC

## 2012-11-25 MED ORDER — THIAMINE HCL 100 MG/ML IJ SOLN: 100.0000 mg | Freq: Every day | INTRAMUSCULAR | Status: DC

## 2012-11-25 MED ORDER — ADULT MULTIVITAMIN W/MINERALS CH: 1.0000 | ORAL_TABLET | Freq: Every day | ORAL | Status: DC

## 2012-11-25 MED ORDER — FOLIC ACID 1 MG PO TABS: 1.0000 mg | ORAL_TABLET | Freq: Every day | ORAL | Status: DC

## 2012-11-25 NOTE — ED Provider Notes (Addendum)
No acute issues overnight. Patient is waiting psychiatric placement  Filed Vitals:   11/25/12 0646  BP: 125/81  Pulse: 96  Temp: 98.1 F (36.7 C)  Resp: 18     Celene Kras, MD 11/25/12 [phone]  Pt informed staff he is not interested in inpatient detox.  He requests to leave.  Vitals stable.   Celene Kras, MD 11/25/12 [phone]

## 2012-11-25 NOTE — ED Notes (Signed)
Patient declines detox today. He is calm and respectful. States he just would like to go home.

## 2012-11-26 ENCOUNTER — Emergency Department (HOSPITAL_COMMUNITY)
Admission: EM | Admit: 2012-11-26 | Discharge: 2012-11-27 | Disposition: A | Discharge status: Home / Self Care | Payer: Self-pay | Attending: Emergency Medicine | Admitting: Emergency Medicine

## 2012-11-26 ENCOUNTER — Encounter (HOSPITAL_COMMUNITY): Payer: Self-pay | Admitting: Emergency Medicine

## 2012-11-26 DIAGNOSIS — F10229 Alcohol dependence with intoxication, unspecified: Secondary | ICD-10-CM | POA: Insufficient documentation

## 2012-11-26 DIAGNOSIS — F17200 Nicotine dependence, unspecified, uncomplicated: Secondary | ICD-10-CM | POA: Insufficient documentation

## 2012-11-26 DIAGNOSIS — F1020 Alcohol dependence, uncomplicated: Secondary | ICD-10-CM

## 2012-11-26 HISTORY — DX: Congenital malformation of eye, unspecified: Q15.9

## 2012-11-26 LAB — BASIC METABOLIC PANEL
CO2: 30 mEq/L (ref 19–32)
Calcium: 9.3 mg/dL (ref 8.4–10.5)
Creatinine, Ser: 1.03 mg/dL (ref 0.50–1.35)
Glucose, Bld: 89 mg/dL (ref 70–99)

## 2012-11-26 NOTE — ED Notes (Addendum)
Pt is aroused, breathing with even chest rises.  Able to state name and birthday but he is confused as to why he is in the hospital and where he is at.  Pt stated he has been drinking today.  Eyes are blood shot red.

## 2012-11-26 NOTE — ED Provider Notes (Signed)
MSE was initiated and I personally evaluated the patient and placed orders (if any) at  10:49 PM on November 26, 2012.  The patient appears stable so that the remainder of the MSE may be completed by another provider.  Patient presents with severe alcohol intoxication as he readily admits as well as EMS who states that his brother called due to the patient's severe intoxication. He has been drinking all day, the patient was noted to be incontinent of urine on arrival but alert and oriented for the paramedics. Of note the patient was seen in the hospital emergency department 2 days ago for severe alcohol intoxication with a level upwards of 450, he eventually declined detox program and wanted to leave on his own volition when he was sober yesterday. The patient denies any complaints to me at this time  Vida Roller, MD 11/26/12 2250

## 2012-11-26 NOTE — ED Notes (Signed)
PT. ARRIVED WITH EMS FROM HOME , BROTHER CALLED EMS DUE TO ALCOHOL INTOXICATION ALL DAY TODAY , ALERT AND ORIENTED AT ARRIVAL / URINARY INCONTINENCE .

## 2012-11-26 NOTE — ED Notes (Addendum)
Tried calling pt's brother's Auren Valdes [phone]) and unable to get a hold of him or leave a message.

## 2012-11-27 ENCOUNTER — Encounter (HOSPITAL_COMMUNITY): Payer: Self-pay | Admitting: *Deleted

## 2012-11-27 NOTE — ED Provider Notes (Addendum)
History     CSN: [id number]  Arrival date & time 11/26/12  2025   First MD Initiated Contact with Patient 11/26/12 2213      Chief Complaint  Patient presents with  . Alcohol Intoxication    (Consider location/radiation/quality/duration/timing/severity/associated sxs/prior treatment) HPI 52 year old male presents to emergency room via EMS with reported alcohol intoxication.  Patient is unsure why he is here.  He reports her brother called the ambulance.  At this time.  He is not requesting alcohol detox.  Patient does not think he has a problem with alcohol.  Patient was seen in the emergency department 2 days ago for similar presentation, at that time.  Alcohol rehabilitation was pursued, but patient refused when he was sober.  Patient denies any pain at this time, and only wants to go back to sleep. Past Medical History  Diagnosis Date  . ETOH abuse     History reviewed. No pertinent past surgical history.  No family history on file.  History  Substance Use Topics  . Smoking status: Current Every Day Smoker -- 0.25 packs/day    Types: Cigarettes  . Smokeless tobacco: Not on file  . Alcohol Use: Yes      Review of Systems  Unable to perform ROS: Other   patient is intoxicated, and refuses to answer review of system questions  Allergies  Review of patient's allergies indicates no known allergies.  Home Medications  No current outpatient prescriptions on file.  BP 142/91  Pulse 84  Temp(Src) 98.7 F (37.1 C) (Oral)  Resp 16  SpO2 97%  Physical Exam  Nursing note and vitals reviewed. Constitutional: He appears well-developed and well-nourished.  HENT:  Head: Normocephalic and atraumatic.  Right Ear: External ear normal.  Left Ear: External ear normal.  Nose: Nose normal.  Mouth/Throat: Oropharynx is clear and moist.  Eyes: Conjunctivae and EOM are normal. Pupils are equal, round, and reactive to light.  Muddy sclera, mild conjunctival injection bilaterally.   Proptosis  Neck: Normal range of motion. Neck supple. No JVD present. No tracheal deviation present. No thyromegaly present.  Cardiovascular: Normal rate, regular rhythm, normal heart sounds and intact distal pulses.  Exam reveals no gallop and no friction rub.   No murmur heard. Pulmonary/Chest: Effort normal and breath sounds normal. No stridor. No respiratory distress. He has no wheezes. He has no rales. He exhibits no tenderness.  Abdominal: Soft. Bowel sounds are normal. He exhibits no distension and no mass. There is no tenderness. There is no rebound and no guarding.  Musculoskeletal: Normal range of motion. He exhibits no edema and no tenderness.  Lymphadenopathy:    He has no cervical adenopathy.  Neurological: He exhibits normal muscle tone. Coordination normal.  Somnolent but arousable  Skin: Skin is warm and dry. No rash noted. No erythema. No pallor.  Psychiatric:  Patient is intoxicated, irritable    ED Course  Procedures (including critical care time)  Labs Reviewed  BASIC METABOLIC PANEL - Abnormal; Notable for the following:    BUN 3 (*)    GFR calc non Af Amer 83 (*)    All other components within normal limits  ETHANOL - Abnormal; Notable for the following:    Alcohol, Ethyl (B) 471 (*)    All other components within normal limits  ETHANOL   No results found.   1. Alcoholism   2. Alcohol intoxication with blood level over 0.3       MDM  52 year old male with alcohol intoxication,  his alcohol level is 471.  At this time.  He cannot make decisions on whether he wants to enter a detox program or not.  Will let him sober overnight, recheck an alcohol in the morning.  Will ask him again in the morning if he is wanting help with his alcohol problem.        Olivia Mackie, MD 11/27/12 1610  Olivia Mackie, MD 11/27/12 [phone]

## 2012-11-27 NOTE — ED Provider Notes (Signed)
Pt ambulatory about ED with steady gait. Is eating and drinking. Cooperative, conversant. Pt declines wanting to stay in ed any longer, states he feels ready for d/c.  Pt refuses etoh rehab/detox program when offered to him by myself on 2 additional occasions. Pt states he will stop drinking on his own.  No tremor or shakes. Hr 90 rr 16. Steady gait. Appears stable for d/c.   Suzi Roots, MD 11/27/12 1153

## 2012-11-28 NOTE — ED Provider Notes (Signed)
Medical screening examination/treatment/procedure(s) were performed by non-physician practitioner and as supervising physician I was immediately available for consultation/collaboration.  Ayren Zumbro J. Kandace Elrod, MD 11/28/12 2338 

## 2012-11-29 ENCOUNTER — Encounter (HOSPITAL_COMMUNITY): Payer: Self-pay | Admitting: *Deleted

## 2012-11-29 ENCOUNTER — Emergency Department (HOSPITAL_COMMUNITY)
Admission: EM | Admit: 2012-11-29 | Discharge: 2012-11-30 | Disposition: A | Discharge status: Other Acute Inpatient Hospital | Payer: Self-pay | Attending: Emergency Medicine | Admitting: Emergency Medicine

## 2012-11-29 DIAGNOSIS — F1020 Alcohol dependence, uncomplicated: Secondary | ICD-10-CM | POA: Insufficient documentation

## 2012-11-29 DIAGNOSIS — Z8669 Personal history of other diseases of the nervous system and sense organs: Secondary | ICD-10-CM | POA: Insufficient documentation

## 2012-11-29 DIAGNOSIS — R000 Tachycardia, unspecified: Secondary | ICD-10-CM | POA: Insufficient documentation

## 2012-11-29 DIAGNOSIS — F329 Major depressive disorder, single episode, unspecified: Secondary | ICD-10-CM | POA: Insufficient documentation

## 2012-11-29 DIAGNOSIS — F3289 Other specified depressive episodes: Secondary | ICD-10-CM | POA: Insufficient documentation

## 2012-11-29 DIAGNOSIS — F17200 Nicotine dependence, unspecified, uncomplicated: Secondary | ICD-10-CM | POA: Insufficient documentation

## 2012-11-29 DIAGNOSIS — F1010 Alcohol abuse, uncomplicated: Secondary | ICD-10-CM

## 2012-11-29 LAB — CBC
Hemoglobin: 16.4 g/dL (ref 13.0–17.0)
MCH: 31.1 pg (ref 26.0–34.0)
MCV: 92 fL (ref 78.0–100.0)
RBC: 5.27 MIL/uL (ref 4.22–5.81)

## 2012-11-29 LAB — COMPREHENSIVE METABOLIC PANEL
ALT: 35 U/L (ref 0–53)
CO2: 30 mEq/L (ref 19–32)
Calcium: 9.1 mg/dL (ref 8.4–10.5)
Chloride: 100 mEq/L (ref 96–112)
GFR calc Af Amer: >90 mL/min (ref >90)
GFR calc non Af Amer: >90 mL/min (ref >90)
Glucose, Bld: 95 mg/dL (ref 70–99)
Sodium: 140 mEq/L (ref 135–145)
Total Bilirubin: 0.5 mg/dL (ref 0.3–1.2)

## 2012-11-29 LAB — RAPID URINE DRUG SCREEN, HOSP PERFORMED
Barbiturates: NOT DETECTED
Opiates: NOT DETECTED
Tetrahydrocannabinol: NOT DETECTED

## 2012-11-29 MED ORDER — LORAZEPAM 2 MG/ML IJ SOLN: 1.0000 mg | Freq: Four times a day (QID) | INTRAMUSCULAR | Status: DC | PRN

## 2012-11-29 MED ORDER — VITAMIN B-1 100 MG PO TABS
100.0000 mg | ORAL_TABLET | Freq: Every day | ORAL | Status: DC
  Administered 2012-11-29 – 2012-11-30 (×2): 100 mg via ORAL
  Filled 2012-11-29 (×2): qty 1

## 2012-11-29 MED ORDER — FOLIC ACID 1 MG PO TABS
1.0000 mg | ORAL_TABLET | Freq: Every day | ORAL | Status: DC
  Administered 2012-11-29 – 2012-11-30 (×2): 1 mg via ORAL
  Filled 2012-11-29 (×2): qty 1

## 2012-11-29 MED ORDER — LORAZEPAM 1 MG PO TABS
1.0000 mg | ORAL_TABLET | Freq: Four times a day (QID) | ORAL | Status: DC | PRN
  Administered 2012-11-30: 1 mg via ORAL
  Filled 2012-11-29: qty 1

## 2012-11-29 MED ORDER — ADULT MULTIVITAMIN W/MINERALS CH
1.0000 | ORAL_TABLET | Freq: Every day | ORAL | Status: DC
  Administered 2012-11-29 – 2012-11-30 (×2): 1 via ORAL
  Filled 2012-11-29 (×2): qty 1

## 2012-11-29 MED ORDER — THIAMINE HCL 100 MG/ML IJ SOLN: 100.0000 mg | Freq: Every day | INTRAMUSCULAR | Status: DC

## 2012-11-29 NOTE — ED Notes (Signed)
Pt sts hes been drinking alcohol since he was 20, sts hes been drinking wine and beer x3 days and not sleeping much; sts he fell this afternoon and his brother called 911 to get him checked in for detox from alcohol

## 2012-11-29 NOTE — ED Notes (Signed)
Patient has one bag of belongings. 

## 2012-11-29 NOTE — ED Notes (Signed)
Report given to Psych ED, Heron Sabins

## 2012-11-29 NOTE — ED Provider Notes (Signed)
History     CSN: [id number]  Arrival date & time 11/29/12  1149   First MD Initiated Contact with Patient 11/29/12 1206      Chief Complaint  Patient presents with  . Medical Clearance    (Consider location/radiation/quality/duration/timing/severity/associated sxs/prior treatment) HPI Comments: 52 year old male with a history of significant alcohol abuse presents with request for help with his alcohol use. He states that he has been drinking heavily for many years, he feels like he drinks alcohol to offset his depression of losing his mother and sisters greater than 10 years ago. He drinks a significant amount of alcohol every day, has had multiple visits to the emergency department in the last week for similar complaints, denies feeling bad medically but states that he is tired of drinking and wants help. He denies suicidality or hallucinations.  The history is provided by the patient.    Past Medical History  Diagnosis Date  . ETOH abuse   . 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 No pertinent past surgical history.  History reviewed. No pertinent family history.  History  Substance Use Topics  . Smoking status: Current Every Day Smoker -- 0.25 packs/day    Types: Cigarettes  . Smokeless tobacco: Not on file  . Alcohol Use: Yes     Comment: drinks beer and wine daily      Review of Systems  All other systems reviewed and are negative.    Allergies  Review of patient's allergies indicates no known allergies.  Home Medications  No current outpatient prescriptions on file.  BP 127/90  Pulse 97  Temp(Src) 99 F (37.2 C) (Oral)  Resp 15  Ht 5\' 11"  (1.803 m)  Wt 180 lb (81.647 kg)  BMI 25.12 kg/m2  SpO2 97%  Physical Exam  Nursing note and vitals reviewed. Constitutional: He appears well-developed and well-nourished. No distress.  HENT:  Head: Normocephalic and atraumatic.   Mouth/Throat: Oropharynx is clear and moist. No oropharyngeal exudate.  Eyes: Conjunctivae are normal. Pupils are equal, round, and reactive to light. Right eye exhibits no discharge. Left eye exhibits no discharge. No scleral icterus.  R eye deviated, dyconjugate gaze  Neck: Normal range of motion. Neck supple. No JVD present. No thyromegaly present.  Cardiovascular: Regular rhythm, normal heart sounds and intact distal pulses.  Exam reveals no gallop and no friction rub.   No murmur heard. Mild tachycardia to 110 on my exam  Pulmonary/Chest: Effort normal and breath sounds normal. No respiratory distress. He has no wheezes. He has no rales.  Abdominal: Soft. Bowel sounds are normal. He exhibits no distension and no mass. There is no tenderness.  Musculoskeletal: Normal range of motion. He exhibits no edema and no tenderness.  Lymphadenopathy:    He has no cervical adenopathy.  Neurological: He is alert. Coordination normal.  No tremor, clear speech, moving all extremities  Skin: Skin is warm and dry. No rash noted. No erythema.  Psychiatric: He has a normal mood and affect. His behavior is normal.  Depressed affect    ED Course  Procedures (including critical care time)  Labs Reviewed  CBC - Abnormal; Notable for the following:    Platelets 108 (*)    All other components within normal limits  COMPREHENSIVE METABOLIC PANEL - Abnormal; Notable for the following:    BUN 3 (*)    Total Protein 8.8 (*)  AST 101 (*)    All other components within normal limits  ETHANOL - Abnormal; Notable for the following:    Alcohol, Ethyl (B) 496 (*)    All other components within normal limits  SALICYLATE LEVEL - Abnormal; Notable for the following:    Salicylate Lvl <2.0 (*)    All other components within normal limits  ACETAMINOPHEN LEVEL  URINE RAPID DRUG SCREEN (HOSP PERFORMED)   No results found.   No diagnosis found.    MDM  Pt is a chronic alcoholic, will need medical eval  with labs prior to clearance, labs pending, MS appropriate for placement.  ED ECG REPORT  I personally interpreted this EKG   Date: 11/29/2012   Rate: 97  Rhythm: ectopic atrial rhythm  QRS Axis: normal  Intervals: normal  ST/T Wave abnormalities: normal  Conduction Disutrbances:none  Narrative Interpretation:   Old EKG Reviewed: Compared with 03/10/2012, no significant changes  I discussed care with the act team Lily Kocher will see in ED       Vida Roller, MD 11/29/12 1551

## 2012-11-29 NOTE — BH Assessment (Signed)
Assessment Note   Todd Tran is an 52 y.o. male. Patient with history of alcohol abuse presents to [facility]. He is here requesting detox for alcohol abuse. Patient admits to drinking alcohol heavy to self medicate his depressive symptoms. He feels hopeless and sad that he doesn't have a support system. He speaks of missing his mother whom passed away in 1998-01-12. He also speaks of (2) sisters that died in 1999-01-13. Patient tearful stating, "My family is all I had and now they are gone". Patient drinking heavily every day for the past 4-5 months. He is drinking 1 liter of liquor and (3) 40 oz beers per day. He started drinking at 7am this morning and last drank 1 hour prior to his arrival here at [facility]. Patient asked if he has a history of withdrawal symptoms and he sts, "I always pee a lot when I'm withdrawing and that's it". He denies history of seizures. However, patient was brought to [facility] Emergency Department this week after he was found in the yard with seizure like activity. He has a history of blackouts often drinking to the point of passing out. He has received inptient detox at [facility] in the past. Patient is motivated to receive detox stating, "If I don't change I will end up drinking myself to death". Patient denies SI, HI, and AVH's.    Axis I: Alcohol Dependence and Depressive Disorder Nos Axis II: Deferred Axis III:  Past Medical History  Diagnosis Date  . ETOH abuse   . 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 ETOH abuse   . 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  No pertinent past surgical history.  Family History: History reviewed. No pertinent family history.  Social History:  reports that he has been smoking Cigarettes.  He has been smoking about 0.25 packs per day. He does not have any smokeless tobacco history on file. He reports that  drinks alcohol. He reports that he does not use illicit drugs.  Additional Social History:  Alcohol / Drug Use Pain Medications: SEE MAR Prescriptions: SEE MAR Over the Counter: SEE MAR History of alcohol / drug use?: Yes Longest period of sobriety (when/how long): "a few days" Negative Consequences of Use: Financial;Legal;Personal relationships Withdrawal Symptoms: Irritability;Tremors;Blackouts Substance #1 Name of Substance 1: Alcohol-beer 1 - Age of First Use: 52 yrs old  1 - Amount (size/oz): (3) 40oz beers  1 - Frequency: daily  1 - Duration: 4-5 months  1 - Last Use / Amount: 11/29/2012; patient started drinking at 7am this morning; last drink was 1 hour prior to his arrival at [facility] Substance #2 Name of Substance 2: Alcohol-wine  2 - Age of First Use: 52 yrs old  2 - Amount (size/oz): 1 liter 2 - Frequency: daily  2 - Duration: 4-5 months  2 - Last Use / Amount: 11/29/2012; patient started drinking at 7am this morning; last drink was 1 hour prior to his arrival at [facility]  CIWA: CIWA-Ar BP: 127/90 mmHg Pulse Rate: 97 Nausea and Vomiting: no  nausea and no vomiting Tactile Disturbances: none Tremor: no tremor Auditory Disturbances: not present Paroxysmal Sweats: no sweat visible Visual Disturbances: not present Anxiety: no anxiety, at ease Headache, Fullness in Head: none present Agitation: normal activity Orientation and Clouding of Sensorium: oriented and can do serial additions CIWA-Ar Total: 0 COWS:    Allergies: No Known Allergies  Home Medications:  (Not in a hospital admission)  OB/GYN Status:  No LMP for male patient.  General Assessment Data Location of Assessment: [facility] ED Living  Arrangements: Alone Can pt return to current living arrangement?: Yes Admission Status: Voluntary Is patient capable of signing voluntary admission?: Yes Transfer from: Acute Hospital Referral Source: Self/Family/Friend  Education Status Is patient currently in school?: No  Risk to self Suicidal Ideation: No Suicidal Intent: No Is patient at risk for suicide?: No Suicidal Plan?: No Access to Means: No What has been your use of drugs/alcohol within the last 12 months?:  (patient denies drug use; reports alcohol use only ) Previous Attempts/Gestures: No How many times?:  (0) Other Self Harm Risks:  (n/a) Triggers for Past Attempts: Other (Comment) (no previous attempts/gestures reported) Intentional Self Injurious Behavior: None Family Suicide History: No Recent stressful life event(s): Other (Comment) (thinks about his moms death in Jan 08, 1998 & (2) sisters death 100) Persecutory voices/beliefs?: No Depression: Yes Depression Symptoms: Feeling worthless/self pity;Loss of interest in usual pleasures Substance abuse history and/or treatment for substance abuse?: Yes Suicide prevention information given to non-admitted patients: Not applicable  Risk to Others Homicidal Ideation: No Thoughts of Harm to Others: No Current Homicidal Intent: No Current Homicidal Plan: No Access to Homicidal Means: No Identified Victim:  (n/a) History of harm to others?: No Assessment of Violence: None Noted Violent Behavior Description:  (patient calm and cooperative) Does patient have access to weapons?: No Does patient have a court date: Yes (signed a check that did not belong to him) Court Date:  (patient has a courtdate on his BDay [birth year])  Psychosis Hallucinations: None noted Delusions: None noted  Mental Status Report Appear/Hygiene: Disheveled Eye Contact: Fair Motor Activity: Freedom of movement Speech: Logical/coherent Level of Consciousness: Alert Mood: Depressed Affect:  Appropriate to circumstance;Other (Comment) (flat) Anxiety Level: None Thought Processes: Relevant Judgement: Impaired (impaired due to ETOH level (496 @ 1237)) Orientation: Person;Place;Time;Situation Obsessive Compulsive Thoughts/Behaviors: None  Cognitive Functioning Concentration: Decreased Memory: Recent Intact;Remote Intact IQ: Average Insight: Poor Impulse Control: Fair Appetite: Fair Weight Loss:  (none reported) Weight Gain:  (none reported) Sleep: Decreased Total Hours of Sleep:  (4-6 hours) Vegetative Symptoms: None  ADLScreening [facility] Assessment Services) Patient's cognitive ability adequate to safely complete daily activities?: Yes Patient able to express need for assistance with ADLs?: Yes Independently performs ADLs?: Yes (appropriate for developmental age)  Abuse/Neglect [facility]) Physical Abuse: Denies Verbal Abuse: Denies Sexual Abuse: Denies  Prior Inpatient Therapy Prior Inpatient Therapy: Yes Prior Therapy Dates:  (patient unable to recall dates ) Prior Therapy Facilty/Provider(s):  ([facility] (2x's)) Reason for Treatment:  (detox)  Prior Outpatient Therapy Prior Outpatient Therapy: No Prior Therapy Dates:  (n/a) Prior Therapy Facilty/Provider(s):  (n/a) Reason for Treatment:  (n/a)  ADL Screening (condition at time of admission) Patient's cognitive ability adequate to safely complete daily activities?: Yes Patient able to express need for assistance with ADLs?: Yes Independently performs ADLs?: Yes (appropriate for developmental age) Weakness of Legs: None Weakness of Arms/Hands: None  Home Assistive Devices/Equipment Home Assistive Devices/Equipment: None  Therapy Consults (therapy consults require a physician order) PT Evaluation Needed: No OT  Evalulation Needed: No SLP Evaluation Needed: No Abuse/Neglect Assessment (Assessment to be complete while patient is alone) Physical Abuse: Denies Verbal Abuse: Denies Sexual Abuse: Denies Exploitation  of patient/patient's resources: Denies Self-Neglect: Denies Values / Beliefs Cultural Requests During Hospitalization: None Spiritual Requests During Hospitalization: None Consults Spiritual Care Consult Needed: No Social Work Consult Needed: No Merchant navy officer (For Healthcare) Advance Directive: Patient does not have advance directive Nutrition Screen- [facility] Adult/[facility] Patient's home diet: Regular  Additional Information 1:1 In Past 12 Months?: No CIRT Risk: No Elopement Risk: No Does patient have medical clearance?: Yes     Disposition:  Disposition Initial Assessment Completed for this Encounter: Yes Disposition of Patient: Inpatient treatment program;Referred to Type of inpatient treatment program: Adult Patient referred to: [facility] (Pt is pending a referral to [facility] and/or [facility] once BAL is ther)  On Site Evaluation by:   Reviewed with Physician:     Octaviano Batty 11/29/2012 4:40 PM

## 2012-11-29 NOTE — ED Notes (Signed)
md made aware of critical value 

## 2012-11-29 NOTE — ED Notes (Signed)
md at bedside  Pt alert and oriented x4. Respirations even and unlabored, bilateral symmetrical rise and fall of chest. Skin warm and dry. In no acute distress. Denies needs.   

## 2012-11-30 ENCOUNTER — Encounter (HOSPITAL_COMMUNITY): Payer: Self-pay

## 2012-11-30 ENCOUNTER — Inpatient Hospital Stay (HOSPITAL_COMMUNITY)
Admission: EM | Admit: 2012-11-30 | Discharge: 2012-12-05 | DRG: 897 | Disposition: A | Discharge status: Home / Self Care | Payer: No Typology Code available for payment source | Source: Intra-hospital | Attending: Psychiatry | Admitting: Psychiatry

## 2012-11-30 DIAGNOSIS — F10129 Alcohol abuse with intoxication, unspecified: Secondary | ICD-10-CM

## 2012-11-30 DIAGNOSIS — F10239 Alcohol dependence with withdrawal, unspecified: Secondary | ICD-10-CM

## 2012-11-30 DIAGNOSIS — F1994 Other psychoactive substance use, unspecified with psychoactive substance-induced mood disorder: Secondary | ICD-10-CM

## 2012-11-30 DIAGNOSIS — IMO0002 Reserved for concepts with insufficient information to code with codable children: Secondary | ICD-10-CM | POA: Diagnosis present

## 2012-11-30 DIAGNOSIS — F10939 Alcohol use, unspecified with withdrawal, unspecified: Secondary | ICD-10-CM

## 2012-11-30 DIAGNOSIS — Z79899 Other long term (current) drug therapy: Secondary | ICD-10-CM

## 2012-11-30 DIAGNOSIS — F1020 Alcohol dependence, uncomplicated: Secondary | ICD-10-CM

## 2012-11-30 LAB — ETHANOL: Alcohol, Ethyl (B): 154 mg/dL — ABNORMAL HIGH (ref 0–11)

## 2012-11-30 MED ORDER — CHLORDIAZEPOXIDE HCL 25 MG PO CAPS
25.0000 mg | ORAL_CAPSULE | ORAL | Status: AC
  Administered 2012-12-03 (×2): 25 mg via ORAL
  Filled 2012-11-30 (×2): qty 1

## 2012-11-30 MED ORDER — ONDANSETRON 4 MG PO TBDP: 4.0000 mg | ORAL_TABLET | Freq: Four times a day (QID) | ORAL | Status: DC | PRN

## 2012-11-30 MED ORDER — ONDANSETRON 4 MG PO TBDP
4.0000 mg | ORAL_TABLET | Freq: Four times a day (QID) | ORAL | Status: AC | PRN
  Administered 2012-11-30: 4 mg via ORAL

## 2012-11-30 MED ORDER — THIAMINE HCL 100 MG/ML IJ SOLN: 100.0000 mg | Freq: Once | INTRAMUSCULAR | Status: AC

## 2012-11-30 MED ORDER — CHLORDIAZEPOXIDE HCL 25 MG PO CAPS
25.0000 mg | ORAL_CAPSULE | Freq: Four times a day (QID) | ORAL | Status: DC | PRN
  Administered 2012-11-30: 25 mg via ORAL
  Filled 2012-11-30: qty 1

## 2012-11-30 MED ORDER — THIAMINE HCL 100 MG/ML IJ SOLN: 100.0000 mg | Freq: Once | INTRAMUSCULAR | Status: DC

## 2012-11-30 MED ORDER — LOPERAMIDE HCL 2 MG PO CAPS: 2.0000 mg | ORAL_CAPSULE | ORAL | Status: AC | PRN

## 2012-11-30 MED ORDER — LOPERAMIDE HCL 2 MG PO CAPS: 2.0000 mg | ORAL_CAPSULE | ORAL | Status: DC | PRN

## 2012-11-30 MED ORDER — ONDANSETRON 4 MG PO TBDP
4.0000 mg | ORAL_TABLET | Freq: Four times a day (QID) | ORAL | Status: DC | PRN
  Administered 2012-11-30: 4 mg via ORAL
  Filled 2012-11-30: qty 1

## 2012-11-30 MED ORDER — CHLORDIAZEPOXIDE HCL 25 MG PO CAPS: 25.0000 mg | ORAL_CAPSULE | Freq: Four times a day (QID) | ORAL | Status: DC | PRN

## 2012-11-30 MED ORDER — HYDROXYZINE HCL 25 MG PO TABS: 25.0000 mg | ORAL_TABLET | Freq: Four times a day (QID) | ORAL | Status: DC | PRN

## 2012-11-30 MED ORDER — CHLORDIAZEPOXIDE HCL 25 MG PO CAPS
25.0000 mg | ORAL_CAPSULE | Freq: Four times a day (QID) | ORAL | Status: AC
  Administered 2012-11-30 – 2012-12-01 (×6): 25 mg via ORAL
  Filled 2012-11-30 (×6): qty 1

## 2012-11-30 MED ORDER — MAGNESIUM HYDROXIDE 400 MG/5ML PO SUSP: 30.0000 mL | Freq: Every day | ORAL | Status: DC | PRN

## 2012-11-30 MED ORDER — ONDANSETRON HCL 4 MG/2ML IJ SOLN
4.0000 mg | Freq: Once | INTRAMUSCULAR | Status: AC
  Administered 2012-11-30: 4 mg via INTRAVENOUS
  Filled 2012-11-30: qty 2

## 2012-11-30 MED ORDER — ADULT MULTIVITAMIN W/MINERALS CH
1.0000 | ORAL_TABLET | Freq: Every day | ORAL | Status: DC
  Administered 2012-12-01 – 2012-12-05 (×5): 1 via ORAL
  Filled 2012-11-30 (×7): qty 1

## 2012-11-30 MED ORDER — CHLORDIAZEPOXIDE HCL 25 MG PO CAPS
25.0000 mg | ORAL_CAPSULE | Freq: Once | ORAL | Status: AC
  Administered 2012-11-30: 25 mg via ORAL
  Filled 2012-11-30: qty 1

## 2012-11-30 MED ORDER — VITAMIN B-1 100 MG PO TABS
100.0000 mg | ORAL_TABLET | Freq: Every day | ORAL | Status: DC
  Administered 2012-12-01 – 2012-12-05 (×5): 100 mg via ORAL
  Filled 2012-11-30 (×6): qty 1

## 2012-11-30 MED ORDER — HYDROXYZINE HCL 25 MG PO TABS: 25.0000 mg | ORAL_TABLET | Freq: Four times a day (QID) | ORAL | Status: AC | PRN

## 2012-11-30 MED ORDER — ALUM & MAG HYDROXIDE-SIMETH 200-200-20 MG/5ML PO SUSP: 30.0000 mL | ORAL | Status: DC | PRN

## 2012-11-30 MED ORDER — CHLORDIAZEPOXIDE HCL 25 MG PO CAPS
25.0000 mg | ORAL_CAPSULE | Freq: Three times a day (TID) | ORAL | Status: AC
  Administered 2012-12-02 (×3): 25 mg via ORAL
  Filled 2012-11-30 (×3): qty 1

## 2012-11-30 MED ORDER — ADULT MULTIVITAMIN W/MINERALS CH: 1.0000 | ORAL_TABLET | Freq: Every day | ORAL | Status: DC

## 2012-11-30 MED ORDER — LORAZEPAM 2 MG/ML IJ SOLN
1.0000 mg | Freq: Once | INTRAMUSCULAR | Status: AC
  Administered 2012-11-30: 1 mg via INTRAVENOUS
  Filled 2012-11-30: qty 1

## 2012-11-30 MED ORDER — ACETAMINOPHEN 325 MG PO TABS: 650.0000 mg | ORAL_TABLET | Freq: Four times a day (QID) | ORAL | Status: DC | PRN

## 2012-11-30 MED ORDER — CHLORDIAZEPOXIDE HCL 25 MG PO CAPS
25.0000 mg | ORAL_CAPSULE | Freq: Every day | ORAL | Status: AC
  Administered 2012-12-04: 25 mg via ORAL
  Filled 2012-11-30: qty 1

## 2012-11-30 MED ORDER — LOPERAMIDE HCL 2 MG PO CAPS
2.0000 mg | ORAL_CAPSULE | ORAL | Status: DC | PRN
Start: 2012-11-30 — End: 2012-11-30

## 2012-11-30 MED ORDER — TRAZODONE HCL 50 MG PO TABS
50.0000 mg | ORAL_TABLET | Freq: Every evening | ORAL | Status: DC | PRN
  Administered 2012-11-30 – 2012-12-04 (×4): 50 mg via ORAL
  Filled 2012-11-30: qty 28
  Filled 2012-11-30 (×3): qty 1

## 2012-11-30 MED ORDER — VITAMIN B-1 100 MG PO TABS: 100.0000 mg | ORAL_TABLET | Freq: Every day | ORAL | Status: DC

## 2012-11-30 MED ORDER — SODIUM CHLORIDE 0.9 % IV BOLUS (SEPSIS)
1000.0000 mL | Freq: Once | INTRAVENOUS | Status: AC
  Administered 2012-11-30: 1000 mL via INTRAVENOUS

## 2012-11-30 MED ORDER — CHLORDIAZEPOXIDE HCL 25 MG PO CAPS: 25.0000 mg | ORAL_CAPSULE | Freq: Four times a day (QID) | ORAL | Status: AC | PRN

## 2012-11-30 NOTE — Progress Notes (Signed)
CM spoke with pt who is here for medical clearance who confirms self pay [facility] resident with no pcp.

## 2012-11-30 NOTE — Tx Team (Signed)
Initial Interdisciplinary Treatment Plan  PATIENT STRENGTHS: (choose at least two) Communication skills General fund of knowledge Motivation for treatment/growth Supportive family/friends  PATIENT STRESSORS: Legal issue Substance abuse   PROBLEM LIST: Problem List/Patient Goals Date to be addressed Date deferred Reason deferred Estimated date of resolution  Substance Abuse       Depression                                                 DISCHARGE CRITERIA:  Motivation to continue treatment in a less acute level of care Need for constant or close observation no longer present  PRELIMINARY DISCHARGE PLAN: Attend 12-step recovery group Outpatient therapy  PATIENT/FAMIILY INVOLVEMENT: This treatment plan has been presented to and reviewed with the patient, Todd Tran, and/or family member, .  The patient and family have been given the opportunity to ask questions and make suggestions.  Noah Charon 11/30/2012, 5:37 PM

## 2012-11-30 NOTE — ED Notes (Signed)
Spoke with Dr.J about concerns of needing an Ativan or Librium protocol ordered. He agreed and orders were received and initiated.

## 2012-11-30 NOTE — ED Notes (Signed)
Report called to Lupita Leash at [facility]. Accepted to room 304-2. Will be transported by hospital security and mental health.

## 2012-11-30 NOTE — [facility] Counselor (Signed)
Patient accepted to [facility] by Dr. Elsie Saas to Dr. Geoffery Lyons. Patients room assignment is #304-2. Support paperwork completed and faxed to [facility]. EDP-Dr. Jeraldine Loots made aware of patients disposition. Patients nurse-Karen also made aware. The call report # is [phone]. Patient is here voluntarily and will transferred to [facility] via sheriff.

## 2012-11-30 NOTE — Progress Notes (Signed)
Patient ID: Todd Tran, male   DOB: [birth year], 52 y.o.   MRN: [id number] D: Pt. Reports tremors, noted by writer. Pt. Observed vomiting by staff. A: Writer administered Librium(see MAR). Zofran also given. Staff will monitor q29min for safety. Pt. Encouraged to verbalize needs. Staff encouraged group. R: Pt. Is safe on the unit. Pt. Attended group.

## 2012-11-30 NOTE — ED Notes (Signed)
Spoke with Dr.Lockwood about P129 and frequent vomiting. [facility] unable to accept due to elevated pulse. Suggested possible dehydration and need for IV fluids. He agreed and stated to call charge nurse for bed placement in ED. Spoke with Jen-assigned room 18. Transported via wheelchair by mental health tech.

## 2012-11-30 NOTE — Progress Notes (Signed)
Patient pleasant and cooperative during admission assessment. Patient denies SI/HI at this time. Patient denies AVH. Patient states "I drink one gallon of wine daily." Patient stressor is the death of his mother in 18 and his sister 40, since that time the  patient resides with uncle. 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 Patient oriented to unit/staff/room. Patient denies any questions/concerns at this time. Patient safe on unit with Q15 minute checks for safety. Will continue to monitor.

## 2012-11-30 NOTE — Consult Note (Signed)
Reason for Consult: Alcohol intoxication and dependence Referring Physician: Dr. [name] is an 52 y.o. male.  HPI: Patient was seen and chart reviewed. Patient presented with alcohol intoxication and requesting detox treatment.Patient has nausea and vomitting, sweating. He has block out but denied history of withdrawal seizures. He has multiple ER visits but not received detox treatment. His brother called 911 when he was passed away. His uncle was hospitalized for rehab treatment. He has legal charge for bad check and has court date on 12/27/12. Patient stated that he has been drinking along with other five or six people. He has denied suicidal or homicidal ideations, intension or plans. He has no evidence of mania or psychosis. Reportedly he was in [facility] about 5 years ago.   MSE: Patient was calm and cooperative. He has decreased psychomotor activity and okay mood and anxious affect. He has slow in verbal response. He has denied SI/HI and no evidence of psychosis.  Past Medical History  Diagnosis Date  . ETOH abuse   . 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 No pertinent past surgical history.  History reviewed. No pertinent family history.  Social History:  reports that he has been smoking Cigarettes.  He has been smoking about 0.25 packs per day. He does not have any smokeless tobacco history on file. He reports that  drinks alcohol. He reports that he does not use illicit drugs.  Allergies: No Known Allergies  Medications: I have reviewed the patient's current medications.  Results for orders placed during the hospital encounter of 11/29/12 (from the past 48 hour(s))  URINE RAPID DRUG SCREEN (HOSP PERFORMED)     Status: None   Collection Time    11/29/12 12:34 PM      Result Value Range   Opiates NONE DETECTED  NONE DETECTED   Cocaine NONE DETECTED  NONE DETECTED   Benzodiazepines NONE DETECTED  NONE DETECTED   Amphetamines NONE DETECTED  NONE DETECTED   Tetrahydrocannabinol NONE DETECTED  NONE DETECTED   Barbiturates NONE DETECTED  NONE DETECTED   Comment:            DRUG SCREEN FOR MEDICAL PURPOSES     ONLY.  IF CONFIRMATION IS NEEDED     FOR ANY PURPOSE, NOTIFY LAB     WITHIN 5 DAYS.                LOWEST DETECTABLE LIMITS     FOR URINE DRUG SCREEN     Drug Class       Cutoff (ng/mL)     Amphetamine      1000     Barbiturate      200     Benzodiazepine   200     Tricyclics       300     Opiates          300     Cocaine          300     THC              50  ACETAMINOPHEN LEVEL     Status: None   Collection Time    11/29/12 12:37 PM      Result Value Range   Acetaminophen (Tylenol), Serum <15.0  10 - 30 ug/mL   Comment:  THERAPEUTIC CONCENTRATIONS VARY     SIGNIFICANTLY. A RANGE OF 10-30     ug/mL MAY BE AN EFFECTIVE     CONCENTRATION FOR MANY PATIENTS.     HOWEVER, SOME ARE BEST TREATED     AT CONCENTRATIONS OUTSIDE THIS     RANGE.     ACETAMINOPHEN CONCENTRATIONS     >150 ug/mL AT 4 HOURS AFTER     INGESTION AND >50 ug/mL AT 12     HOURS AFTER INGESTION ARE     OFTEN ASSOCIATED WITH TOXIC     REACTIONS.  CBC     Status: Abnormal   Collection Time    11/29/12 12:37 PM      Result Value Range   WBC 4.4  4.0 - 10.5 K/uL   RBC 5.27  4.22 - 5.81 MIL/uL   Hemoglobin 16.4  13.0 - 17.0 g/dL   HCT 16.1  09.6 - 04.5 %   MCV 92.0  78.0 - 100.0 fL   MCH 31.1  26.0 - 34.0 pg   MCHC 33.8  30.0 - 36.0 g/dL   RDW 40.9  81.1 - 91.4 %   Platelets 108 (*) 150 - 400 K/uL   Comment: CONSISTENT WITH PREVIOUS RESULT  COMPREHENSIVE METABOLIC PANEL     Status: Abnormal   Collection Time    11/29/12 12:37 PM      Result Value Range   Sodium 140  135 - 145 mEq/L   Potassium 4.1  3.5 - 5.1 mEq/L   Chloride 100  96 - 112 mEq/L   CO2 30  19 - 32 mEq/L   Glucose, Bld 95  70 - 99 mg/dL   BUN 3 (*) 6 - 23 mg/dL   Creatinine, Ser 7.82   0.50 - 1.35 mg/dL   Calcium 9.1  8.4 - 95.6 mg/dL   Total Protein 8.8 (*) 6.0 - 8.3 g/dL   Albumin 3.6  3.5 - 5.2 g/dL   AST 213 (*) 0 - 37 U/L   ALT 35  0 - 53 U/L   Alkaline Phosphatase 106  39 - 117 U/L   Total Bilirubin 0.5  0.3 - 1.2 mg/dL   GFR calc non Af Amer >90  >90 mL/min   GFR calc Af Amer >90  >90 mL/min   Comment:            The eGFR has been calculated     using the CKD EPI equation.     This calculation has not been     validated in all clinical     situations.     eGFR's persistently     <90 mL/min signify     possible Chronic Kidney Disease.  ETHANOL     Status: Abnormal   Collection Time    11/29/12 12:37 PM      Result Value Range   Alcohol, Ethyl (B) 496 (*) 0 - 11 mg/dL   Comment:            LOWEST DETECTABLE LIMIT FOR     SERUM ALCOHOL IS 11 mg/dL     FOR MEDICAL PURPOSES ONLY     CRITICAL RESULT CALLED TO, READ BACK BY AND VERIFIED WITH:     HASV,F. AT 1323 ON [id number] BY LOVE,T.  SALICYLATE LEVEL     Status: Abnormal   Collection Time    11/29/12 12:37 PM      Result Value Range   Salicylate Lvl <2.0 (*) 2.8 - 20.0 mg/dL  ETHANOL  Status: Abnormal   Collection Time    11/30/12  5:21 AM      Result Value Range   Alcohol, Ethyl (B) 154 (*) 0 - 11 mg/dL   Comment:            LOWEST DETECTABLE LIMIT FOR     SERUM ALCOHOL IS 11 mg/dL     FOR MEDICAL PURPOSES ONLY    No results found.  Positive for anxiety, bad mood, depression and excessive alcohol consumption Blood pressure 157/101, pulse 99, temperature 98.2 F (36.8 C), temperature source Oral, resp. rate 16, height 5\' 11"  (1.803 m), weight 180 lb (81.647 kg), SpO2 98.00%.   Assessment/Plan: Alcohol dependence and intoxication Alcohol withdrawal  Substance induced mood disorder  Recommendation: Voluntary Admit to [facility] for alcohol detox treatment and possible rehab services. Patient will be started Librium protocol and supportive therapy.    Todd Tran,Todd R. 11/30/2012,  11:13 AM

## 2012-11-30 NOTE — ED Provider Notes (Addendum)
8:14 AM Patient sleeping.  BP 154/72  Pulse 81  Temp(Src) 98.3 F (36.8 C) (Oral)  Resp 18  Ht 5\' 11"  (1.803 m)  Wt 180 lb (81.647 kg)  BMI 25.12 kg/m2  SpO2 97%  Per RN, no remarkable events overnight.  Placement pending.    Gerhard Munch, MD 11/30/12 0815  11:52 AM Patient accepted to [facility]. Dr. Carmelina Dane accepts  Gerhard Munch, MD 11/30/12 1153  12:28 PM Patient is tachycardic, with a heart rate 120.  He is also vomiting. Although the patient has been accepted to behavioral health, prior to transfer the nurses notes discomfort with these changes.  The patient was written for CIWA protocol ativan, but received only 1mg  earlier today.  The patient will receive IVF, antiemetics, though with his well documented Hx of etoh abuse and relapse, if he improves he will still be appropriate for [facility] treatment of his abuse.   2:59 PM Patient asleep. HR <90, BP improved. No additional emesis. No indication (at this time) for inpatient withdrawal treatment.   Gerhard Munch, MD 11/30/12 [phone]

## 2012-11-30 NOTE — ED Notes (Signed)
Pt belongings still locked up in locker 41

## 2012-11-30 NOTE — ED Notes (Addendum)
In to give AM meds and he got up and was sick in the trash can. Notified Dr.Lockwood who suggested to try the Ativan 1mg  PO. Given as directed.

## 2012-12-01 ENCOUNTER — Telehealth (HOSPITAL_COMMUNITY): Payer: Self-pay | Admitting: Licensed Clinical Social Worker

## 2012-12-01 ENCOUNTER — Encounter (HOSPITAL_COMMUNITY): Payer: Self-pay | Admitting: Psychiatry

## 2012-12-01 DIAGNOSIS — IMO0002 Reserved for concepts with insufficient information to code with codable children: Secondary | ICD-10-CM | POA: Diagnosis present

## 2012-12-01 DIAGNOSIS — F1090 Alcohol use, unspecified, uncomplicated: Secondary | ICD-10-CM | POA: Diagnosis present

## 2012-12-01 DIAGNOSIS — F1010 Alcohol abuse, uncomplicated: Secondary | ICD-10-CM

## 2012-12-01 DIAGNOSIS — F10129 Alcohol abuse with intoxication, unspecified: Secondary | ICD-10-CM | POA: Diagnosis present

## 2012-12-01 MED ORDER — LISINOPRIL 10 MG PO TABS
10.0000 mg | ORAL_TABLET | Freq: Every day | ORAL | Status: DC
  Administered 2012-12-01 – 2012-12-05 (×5): 10 mg via ORAL
  Filled 2012-12-01 (×8): qty 1

## 2012-12-01 NOTE — [facility] Suicide Risk Assessment (Signed)
Suicide Risk Assessment  Admission Assessment     Nursing information obtained from:  Patient Demographic factors:  Male Current Mental Status:  NA Loss Factors:  Legal issues Historical Factors:  NA Risk Reduction Factors:  Living with another person, especially a relative;Positive social support  CLINICAL FACTORS:   Depression:   Comorbid alcohol abuse/dependence Alcohol/Substance Abuse/Dependencies  COGNITIVE FEATURES THAT CONTRIBUTE TO RISK:  Closed-mindedness Polarized thinking Thought constriction (tunnel vision)    SUICIDE RISK:   Moderate:  Frequent suicidal ideation with limited intensity, and duration, some specificity in terms of plans, no associated intent, good self-control, limited dysphoria/symptomatology, some risk factors present, and identifiable protective factors, including available and accessible social support.  PLAN OF CARE: Supportive approach/coping skills/relapse prevention                              Pursue Librium detox protocol                               Reassess and address the co morbidites  I certify that inpatient services furnished can reasonably be expected to improve the patient's condition.  Lacosta Hargan A 12/01/2012, 3:57 PM

## 2012-12-01 NOTE — Progress Notes (Signed)
Recreation Therapy Notes  Date: 04.10.2014 Time: 3:00pm Location: [facility] [place]      Group Topic/Focus: Teamwork, Communication, Problem Solving  Participation Level: Active  Participation Quality: Appropriate  Affect: Euthymic  Cognitive: Appropriate   Additional Comments: Activity: Drop the Ball; Explaination: Patient were broken up into 2 teams. Patients were given 10 drinking straws, a length of masking tape and asked to create a landing pad that would catch a ping pong ball from appropriately 5 feet in the air. Patients were given 20 minutes to work together to create the landing pad.   Patient actively participated in group activity. Patient team unsuccessful at building a landing pad to catch a ping pong ball. Patient worked well with peers. Patient participated in wrap up discussion about using communications skills and problem solving skills to create a support system post d/c.   Marykay Lex Chanda Laperle, LRT/CTRS  Jearl Klinefelter 12/01/2012 4:47 PM

## 2012-12-01 NOTE — Progress Notes (Signed)
[facility] LCSW Aftercare Discharge Planning Group Note   12/01/2012  8:45 AM  Participation Quality:  Adequate  Mood/Affect:  Appropriate  Depression Rating:  5  Anxiety Rating:  5  Thoughts of Suicide:  No Will you contract for safety?   NA  Current AVH:  No  Plan for Discharge/Comments:  Patient reports desire to go through detox, uncertain re follow up as wishes to contact employer about job status. Patient lives with uncle who is currently hospitalized for pancreatitus  Transportation Means: Bus  Supports: Family, coworkers  Todd Tran, Todd Tran

## 2012-12-01 NOTE — Progress Notes (Signed)
Patient did attend the evening karaoke group. Pt was engaged and supportive during group.  
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
## 2012-12-01 NOTE — Progress Notes (Signed)
Patient ID: Todd Tran, male   DOB: [birth year], 52 y.o.   MRN: [id number] D: Pt. Sitting in dayroom, reports feeling better today. "I been able to eat lunch and dinner." pt. Polite thanks [place] for help.  A: Staff will monitor q57min for safety. Data processing manager. R: Pt. Is safe on the unit. Pt. attended karaoke.

## 2012-12-01 NOTE — Progress Notes (Signed)
D:  Kaleb denies SI/HI/AVH at this time.  He continues to have some tremors, but reports his symptoms are much better today.  He is attending groups and is interacting appropriately with staff and peers.   A:  Medications administered as ordered.  Emotional support provided.  Safety checks q 15 minutes. R:  Safety maintained on unit.

## 2012-12-01 NOTE — Progress Notes (Signed)
Adult Psychoeducational Group Note  Date:  12/01/2012 Time:  6:53 PM  Group Topic/Focus:  Overcoming Stress:   The focus of this group is to define stress and help patients assess their triggers.  Participation Level:  Minimal  Participation Quality:  Appropriate  Affect:  Flat  Cognitive:  Appropriate  Insight: Appropriate  Engagement in Group:  Limited  Modes of Intervention:  Education and Support  Additional Comments:   Pt reported to group late. Pt actively participated in group by completing "Stress Interview" worksheet. Group discussed triggers for stress, physical signs of stress, and positive coping skills for dealing with stress. Pt offered no contributions to group discussion.   Reinaldo Raddle K 12/01/2012, 6:53 PM

## 2012-12-01 NOTE — [facility] Group Notes (Cosign Needed)
[facility] LCSW Group Therapy  12/01/2012 2:56 PM  Type of Therapy:  Group Therapy  Participation Level:  Active  Participation Quality:  Appropriate and Attentive  Affect:  Appropriate  Cognitive:  Alert, Appropriate and Oriented  Insight:  Limited  Engagement in Therapy:  Engaged  Modes of Intervention:  Discussion and Support  Summary of Progress/Problems:  Focus of group processing discussion was on balance in life; the components in life which have a negative influence on balance and the components which make for a more balanced life.  Patient shared that he spoke with his boss today, who told him that he is glad patient is getting help.  Patient said that typically having a job has provided balance in his life, but recently alcohol has interfered.  In order to regain balance, patient shared that he needs to "stay away from" certain people in his life who influence him to turn to alcohol.  Vikki Ports, BS, Counseling Intern 12/01/2012, 2:59 PM

## 2012-12-02 DIAGNOSIS — F10229 Alcohol dependence with intoxication, unspecified: Secondary | ICD-10-CM

## 2012-12-02 DIAGNOSIS — F1020 Alcohol dependence, uncomplicated: Secondary | ICD-10-CM

## 2012-12-02 NOTE — [facility] Group Notes (Addendum)
[facility] LCSW Aftercare Discharge Planning Group Note   12/02/2012 8:45 AM  Participation Quality:  Adequate  Mood/Affect:  Depressed  Depression Rating:  5  Anxiety Rating:  5  Thoughts of Suicide:  No Will you contract for safety?   NA  Current AVH:  No  Plan for Discharge/Comments:  Patient requests referral to [facility]  Transportation Means: Bus  Supports: Uncle, [employer], Julious Payer

## 2012-12-02 NOTE — [facility] Group Notes (Cosign Needed)
[facility] LCSW Group Therapy  Relapse Prevention 1:15-2:15 12/02/2012    Type of Therapy: Group Therapy   Participation Level: Did Not Attend  Sylvio Weatherall L 12/02/2012   

## 2012-12-02 NOTE — Progress Notes (Signed)
Adult Psychoeducational Group Note  Date:  12/02/2012 Time:  3:42 PM  Group Topic/Focus:  Relapse Prevention Planning:   The focus of this group is to define relapse and discuss the need for planning to combat relapse.  Participation Level:  None  Participation Quality:  Appropriate  Affect:  Appropriate and Flat  Cognitive:  Alert and Appropriate  Insight: Appropriate  Engagement in Group:  Lacking  Modes of Intervention:  Discussion and Education  Additional Comments:  Pt attended group but did not participate. Pt appeared engaged in group discussion and appeared to be listening to other pts.   [name] 12/02/2012, 3:42 PM
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
## 2012-12-02 NOTE — Progress Notes (Signed)
[facility] MD Progress Note  12/02/2012 2:48 PM Todd Tran  MRN:  [id number]  Subjective:  "The SW lady was telling me something about going to meetings may be 3 days a week for my alcoholism. I think that I might look into that when I get out of here. I will be going to my uncle's house when I get discharged from here. I'm feeling some better. A little shakes here and there"  Diagnosis:   Axis I: Alcohol abuse with intoxication, Alcohol dependence Axis II: Deferred Axis III:  Past Medical History  Diagnosis Date  . ETOH abuse   . 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  ADL's:  Fair  Sleep: Good  Appetite:  Good  Suicidal Ideation:  Plan:  Denies Intent:  Denies Means:  Denies Homicidal Ideation:  Plan:  Denies Intent:  Denies Means:  Denies AEB (as evidenced by):  Psychiatric Specialty Exam: Review of Systems  Constitutional: Negative.   HENT: Negative.   Eyes: Negative.   Respiratory: Negative.   Cardiovascular: Negative.   Gastrointestinal: Negative.   Genitourinary: Negative.   Musculoskeletal: Negative.   Skin: Negative.   Neurological: Negative.   Endo/Heme/Allergies: Negative.   Psychiatric/Behavioral: Positive for substance abuse. Negative for depression, suicidal ideas, hallucinations and memory loss. The patient has insomnia.     Blood pressure 113/80, pulse 98, temperature 98.1 F (36.7 C), temperature source Oral, resp. rate 16, height 5\' 11"  (1.803 m), weight 82.555 kg (182 lb), SpO2 93.00%.Body mass index is 25.4 kg/(m^2).  General Appearance: Casual  Eye Contact::  Fair  Speech:  Clear and Coherent  Volume:  Normal  Mood:  "I feel alright"  Affect:  Restricted  Thought Process:  Coherent  Orientation:  Full (Time, Place, and Person)  Thought Content:  Rumination  Suicidal Thoughts:  No  Homicidal Thoughts:  No  Memory:   Immediate;   Fair Recent;   Fair Remote;   Poor  Judgement:  Fair  Insight:  Fair  Psychomotor Activity:  Normal  Concentration:  Good  Recall:  Fair  Akathisia:  No  Handed:  Right  AIMS (if indicated):     Assets:  Desire for Improvement  Sleep:  Number of Hours: 6.25   Current Medications: Current Facility-Administered Medications  Medication Dose Route Frequency Provider Last Rate Last Dose  . acetaminophen (TYLENOL) tablet 650 mg  650 mg Oral Q6H PRN Karolee Stamps, NP      . alum & mag hydroxide-simeth (MAALOX/MYLANTA) 200-200-20 MG/5ML suspension 30 mL  30 mL Oral Q4H PRN Karolee Stamps, NP      . chlordiazePOXIDE (LIBRIUM) capsule 25 mg  25 mg Oral Q6H PRN Karolee Stamps, NP      . chlordiazePOXIDE (LIBRIUM) capsule 25 mg  25 mg Oral TID Karolee Stamps, NP   25 mg at 12/02/12 1206   Followed by  . [START ON 12/03/2012] chlordiazePOXIDE (LIBRIUM) capsule 25 mg  25 mg Oral BH-qamhs Karolee Stamps, NP       Followed by  . [START ON 12/04/2012] chlordiazePOXIDE (LIBRIUM) capsule 25 mg  25 mg Oral Daily Karolee Stamps, NP      . hydrOXYzine (ATARAX/VISTARIL) tablet 25 mg  25 mg Oral Q6H PRN Karolee Stamps, NP      . lisinopril (PRINIVIL,ZESTRIL) tablet 10 mg  10  mg Oral Daily Sanjuana Kava, NP   10 mg at 12/02/12 0815  . loperamide (IMODIUM) capsule 2-4 mg  2-4 mg Oral PRN Karolee Stamps, NP      . magnesium hydroxide (MILK OF MAGNESIA) suspension 30 mL  30 mL Oral Daily PRN Karolee Stamps, NP      . multivitamin with minerals tablet 1 tablet  1 tablet Oral Daily Karolee Stamps, NP   1 tablet at 12/02/12 0815  . ondansetron (ZOFRAN-ODT) disintegrating tablet 4 mg  4 mg Oral Q6H PRN Karolee Stamps, NP   4 mg at 11/30/12 2147  . thiamine (B-1) injection 100 mg  100 mg Intramuscular Once Karolee Stamps, NP      . thiamine (VITAMIN B-1) tablet 100 mg  100 mg Oral Daily Karolee Stamps, NP   100 mg at 12/02/12 0815  . traZODone (DESYREL) tablet 50 mg  50 mg Oral QHS PRN,MR X  1 Karolee Stamps, NP   50 mg at 12/01/12 2221    Lab Results: No results found for this or any previous visit (from the past 48 hour(s)).  Physical Findings: AIMS: Facial and Oral Movements Muscles of Facial Expression: None, normal Lips and Perioral Area: None, normal Jaw: None, normal Tongue: None, normal,Extremity Movements Upper (arms, wrists, hands, fingers): None, normal Lower (legs, knees, ankles, toes): None, normal, Trunk Movements Neck, shoulders, hips: None, normal, Overall Severity Severity of abnormal movements (highest score from questions above): None, normal Incapacitation due to abnormal movements: None, normal Patient's awareness of abnormal movements (rate only patient's report): No Awareness, Dental Status Current problems with teeth and/or dentures?: Yes (missing lower teeth) Does patient usually wear dentures?: Yes  CIWA:  CIWA-Ar Total: 4 COWS:     Treatment Plan Summary: Daily contact with patient to assess and evaluate symptoms and progress in treatment Medication management  Plan: Supportive approach/coping skills/relapse prevention. Encouraged out of room, participation in group sessions and application of coping skills when distressed. Will continue to monitor response to/adverse effects of medications in use to assure effectiveness. Continue to monitor mood, behavior and interaction with staff and other patients. Continue current plan of care.  Medical Decision Making Problem Points:  Established problem, stable/improving (1), Review of last therapy session (1) and Review of psycho-social stressors (1) Data Points:  Review of medication regiment & side effects (2) Review of new medications or change in dosage (2)  I certify that inpatient services furnished can reasonably be expected to improve the patient's condition.   Armandina Stammer I 12/02/2012, 2:48 PM

## 2012-12-02 NOTE — Tx Team (Signed)
Interdisciplinary Treatment Plan Update (Adult)  Date: 12/02/2012  Time Reviewed: 9:51 AM   Progress in Treatment: Attending groups: Yes Participating in groups: Yes Taking medication as prescribed:  Yes Tolerating medication:  Yes Family/Significant othe contact made: No Patient understands diagnosis: Yes Discussing patient identified problems/goals with staff: Yes Medical problems stabilized or resolved:  Yes Denies suicidal/homicidal ideation: Yes Patient has not harmed self or Others: Yes  New problem(s) identified: None Identified  Discharge Plan or Barriers:  CSW to refer patient to [facility] as per his request  Additional comments: N/A  Reason for Continuation of Hospitalization: Anxiety Depression Medication stabilization Suicidal ideation Withdrawal symptoms; patient came in with alcohol level at 496   Estimated length of stay: 3-5 days  For review of initial/current patient goals, please see plan of care.  Attendees:  Patient:    Family:    Physician: Geoffery Lyons  12/02/2012 9:51 AM   Nursing: Robbie Louis, RN  12/02/2012 9:51 AM   Clinical Social Worker Ronda Fairly  12/02/2012 9:51 AM   Other: Georgina Quint, [place] student  12/02/2012 9:51 AM   Other: Carney Living, RN  12/02/2012 9:51 AM   Other: Serena Colonel, PA  12/02/2012 9:51 AM   Other:  12/02/2012 9:51 AM    Scribe for Treatment Team:  Carney Bern, LCSWA 12/02/2012 9:51 AM

## 2012-12-03 DIAGNOSIS — F10239 Alcohol dependence with withdrawal, unspecified: Secondary | ICD-10-CM
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
## 2012-12-03 NOTE — Progress Notes (Signed)
[facility] MD Progress Note  12/03/2012 5:27 PM Todd Tran  MRN:  [id number] Subjective:  Todd Tran states that he wants to go to a rehab. He is concerned about his brother who needs someone to check on him. Once discharged, he would like to go home for a little while waiting for the rehab bed to get open. He did sleep better last night. Still not eating as he should. Diagnosis:  Alcohol Dependence/withdrawal  ADL's:  Intact  Sleep: Fair  Appetite:  Fair  Suicidal Ideation:  Plan:  denies Intent:  denies Means:  denies Homicidal Ideation:  Plan:  denies Intent:  denies Means:  denies AEB (as evidenced by):  Psychiatric Specialty Exam: Review of Systems  Constitutional: Negative.   HENT: Negative.   Eyes: Negative.   Respiratory: Negative.   Cardiovascular: Negative.   Gastrointestinal: Negative.   Genitourinary: Negative.   Musculoskeletal: Negative.   Skin: Negative.   Neurological: Negative.   Endo/Heme/Allergies: Negative.   Psychiatric/Behavioral: Positive for substance abuse. The patient has insomnia.     Blood pressure 112/73, pulse 92, temperature 98.9 F (37.2 C), temperature source Oral, resp. rate 18, height 5\' 11"  (1.803 m), weight 82.555 kg (182 lb), SpO2 93.00%.Body mass index is 25.4 kg/(m^2).  General Appearance: Disheveled  Eye Solicitor::  Fair  Speech:  Clear and Coherent and Slow  Volume:  Decreased  Mood:  worried  Affect:  worried  Thought Process:  Coherent and Goal Directed  Orientation:  Full (Time, Place, and Person)  Thought Content:  worries, concerns  Suicidal Thoughts:  No  Homicidal Thoughts:  No  Memory:  Immediate;   Fair Recent;   Fair Remote;   Fair  Judgement:  Fair  Insight:  Shallow  Psychomotor Activity:  Decreased  Concentration:  Fair  Recall:  Fair  Akathisia:  No  Handed:  Right  AIMS (if indicated):     Assets:  Desire for Improvement  Sleep:  Number of Hours: 6.25   Current Medications: Current Facility-Administered  Medications  Medication Dose Route Frequency Provider Last Rate Last Dose  . acetaminophen (TYLENOL) tablet 650 mg  650 mg Oral Q6H PRN Karolee Stamps, NP      . alum & mag hydroxide-simeth (MAALOX/MYLANTA) 200-200-20 MG/5ML suspension 30 mL  30 mL Oral Q4H PRN Karolee Stamps, NP      . chlordiazePOXIDE (LIBRIUM) capsule 25 mg  25 mg Oral Q6H PRN Karolee Stamps, NP      . chlordiazePOXIDE (LIBRIUM) capsule 25 mg  25 mg Oral BH-qamhs Karolee Stamps, NP   25 mg at 12/03/12 0820   Followed by  . [START ON 12/04/2012] chlordiazePOXIDE (LIBRIUM) capsule 25 mg  25 mg Oral Daily Karolee Stamps, NP      . hydrOXYzine (ATARAX/VISTARIL) tablet 25 mg  25 mg Oral Q6H PRN Karolee Stamps, NP      . lisinopril (PRINIVIL,ZESTRIL) tablet 10 mg  10 mg Oral Daily Sanjuana Kava, NP   10 mg at 12/03/12 0820  . loperamide (IMODIUM) capsule 2-4 mg  2-4 mg Oral PRN Karolee Stamps, NP      . magnesium hydroxide (MILK OF MAGNESIA) suspension 30 mL  30 mL Oral Daily PRN Karolee Stamps, NP      . multivitamin with minerals tablet 1 tablet  1 tablet Oral Daily Karolee Stamps, NP   1 tablet at 12/03/12 0819  . ondansetron (ZOFRAN-ODT) disintegrating tablet 4 mg  4 mg Oral Q6H  PRN Karolee Stamps, NP   4 mg at 11/30/12 2147  . thiamine (B-1) injection 100 mg  100 mg Intramuscular Once Karolee Stamps, NP      . thiamine (VITAMIN B-1) tablet 100 mg  100 mg Oral Daily Karolee Stamps, NP   100 mg at 12/03/12 0820  . traZODone (DESYREL) tablet 50 mg  50 mg Oral QHS PRN,MR X 1 Karolee Stamps, NP   50 mg at 12/01/12 2221    Lab Results: No results found for this or any previous visit (from the past 48 hour(s)).  Physical Findings: AIMS: Facial and Oral Movements Muscles of Facial Expression: None, normal Lips and Perioral Area: None, normal Jaw: None, normal Tongue: None, normal,Extremity Movements Upper (arms, wrists, hands, fingers): None, normal Lower (legs, knees, ankles, toes): None, normal, Trunk  Movements Neck, shoulders, hips: None, normal, Overall Severity Severity of abnormal movements (highest score from questions above): None, normal Incapacitation due to abnormal movements: None, normal Patient's awareness of abnormal movements (rate only patient's report): No Awareness, Dental Status Current problems with teeth and/or dentures?: Yes (missing lower teeth) Does patient usually wear dentures?: Yes  CIWA:  CIWA-Ar Total: 8 COWS:     Treatment Plan Summary: Daily contact with patient to assess and evaluate symptoms and progress in treatment Medication management  Plan: Supportive approach/coping skills/relapse prevention          Continue and complete the detox  Medical Decision Making Problem Points:  Review of psycho-social stressors (1) Data Points:  Review of medication regiment & side effects (2)  I certify that inpatient services furnished can reasonably be expected to improve the patient's condition.   Dosha Broshears A 12/03/2012, 5:27 PM

## 2012-12-03 NOTE — Progress Notes (Signed)
Psychoeducational Group Note  Date:  12/03/2012 Time:  0945 am  Group Topic/Focus:  Identifying Needs:   The focus of this group is to help patients identify their personal needs that have been historically problematic and identify healthy behaviors to address their needs.  Participation Level:  Minimal  Participation Quality:  Appropriate  Affect:  Blunted  Cognitive:  Alert  Insight:  Limited  Engagement in Group:  Limited  Additional Comments:    Andrena Mews 12/03/2012,2:13 PM

## 2012-12-03 NOTE — Progress Notes (Signed)
Adult Psychoeducational Group Note  Date:  12/03/2012 Time:  0900  Group Topic/Focus:  Self inventory sheet  Participation Level:  Active  Participation Quality:  Appropriate  Affect:  Appropriate  Cognitive:  Appropriate  Insight: Appropriate  Engagement in Group:  Engaged  Modes of Intervention:  Discussion  Additional Comments:  participation  Roselee Culver 12/03/2012, 12:31 PM

## 2012-12-03 NOTE — Progress Notes (Signed)
D.  Pt denies complaints on approach, positive for evening group.  Interacting appropriately within milieu.  Denies SI/HI/hallucinations at this time.  Sitting in medication room playing cards with peers.  A.  Support and encouragement offered  R.  Pt remains safe on unit, will continue to monitor.

## 2012-12-03 NOTE — Progress Notes (Signed)
Patient ID: Todd Tran, male   DOB: 02-May-1961, 52 y.o.   MRN: [id number] D)  Has been out and about on the hall this evening, attended group, interacting with select peers and staff, no c/o's voiced.  Had no scheduled hs meds. A)  Will continue to monitor for safety, continue POC R)  Safety maintained.

## 2012-12-03 NOTE — Progress Notes (Signed)
D   Pt is quiet and cooperative   He attended but participated minimally in groups   He denies any signs and symptoms of withdrawal   He is not very forthcoming with information  A   Verbal support given  Medications administered and effectiveness monitored   Q 15 min checks R   Pt safe at present

## 2012-12-04 NOTE — Progress Notes (Signed)
[facility] MD Progress Note  12/04/2012 3:01 PM Todd Tran  MRN:  [id number] Subjective:  Todd Tran endorses that he will be ready to go tomorrow. He is worried about his brother who has a seizure disorder as well as CP. He wants to go to a CD IOP as does not want to go to a residential treatment program. [place] he is going to be able to make at that level of care. If not, he will be ready to go to rehab, Diagnosis:  Alcohol Dependence/withdrawal  ADL's:  Intact  Sleep: Fair  Appetite:  Fair  Suicidal Ideation:  Plan:  denies Intent:  denies Means:  denies Homicidal Ideation:  Plan:  denies Intent:  denies Means:  denies AEB (as evidenced by):  Psychiatric Specialty Exam: Review of Systems  Constitutional: Negative.   HENT: Negative.   Eyes: Negative.   Respiratory: Negative.   Cardiovascular: Negative.   Gastrointestinal: Negative.   Genitourinary: Negative.   Musculoskeletal: Negative.   Skin: Negative.   Neurological: Negative.   Endo/Heme/Allergies: Negative.   Psychiatric/Behavioral: Positive for substance abuse. The patient has insomnia.     Blood pressure 100/68, pulse 82, temperature 98.4 F (36.9 C), temperature source Oral, resp. rate 18, height 5\' 11"  (1.803 m), weight 82.555 kg (182 lb), SpO2 93.00%.Body mass index is 25.4 kg/(m^2).  General Appearance: Fairly Groomed  Patent attorney::  Fair  Speech:  Clear and Coherent, Slow and not spontaneous  Volume:  Decreased  Mood:  worried  Affect:  worried  Thought Process:  Coherent and Goal Directed  Orientation:  Full (Time, Place, and Person)  Thought Content:  worries, concerns  Suicidal Thoughts:  No  Homicidal Thoughts:  No  Memory:  Immediate;   Fair Recent;   Fair Remote;   Fair  Judgement:  Fair  Insight:  Present  Psychomotor Activity:  Decreased  Concentration:  Fair  Recall:  Fair  Akathisia:  No  Handed:  Right  AIMS (if indicated):     Assets:  Desire for Improvement  Sleep:  Number of Hours:  4.75   Current Medications: Current Facility-Administered Medications  Medication Dose Route Frequency Provider Last Rate Last Dose  . acetaminophen (TYLENOL) tablet 650 mg  650 mg Oral Q6H PRN Karolee Stamps, NP      . alum & mag hydroxide-simeth (MAALOX/MYLANTA) 200-200-20 MG/5ML suspension 30 mL  30 mL Oral Q4H PRN Karolee Stamps, NP      . lisinopril (PRINIVIL,ZESTRIL) tablet 10 mg  10 mg Oral Daily Sanjuana Kava, NP   10 mg at 12/04/12 0801  . magnesium hydroxide (MILK OF MAGNESIA) suspension 30 mL  30 mL Oral Daily PRN Karolee Stamps, NP      . multivitamin with minerals tablet 1 tablet  1 tablet Oral Daily Karolee Stamps, NP   1 tablet at 12/04/12 0801  . thiamine (B-1) injection 100 mg  100 mg Intramuscular Once Karolee Stamps, NP      . thiamine (VITAMIN B-1) tablet 100 mg  100 mg Oral Daily Karolee Stamps, NP   100 mg at 12/04/12 0802  . traZODone (DESYREL) tablet 50 mg  50 mg Oral QHS PRN,MR X 1 Karolee Stamps, NP   50 mg at 12/03/12 2127    Lab Results: No results found for this or any previous visit (from the past 48 hour(s)).  Physical Findings: AIMS: Facial and Oral Movements Muscles of Facial Expression: None, normal Lips and Perioral Area: None, normal  Jaw: None, normal Tongue: None, normal,Extremity Movements Upper (arms, wrists, hands, fingers): None, normal Lower (legs, knees, ankles, toes): None, normal, Trunk Movements Neck, shoulders, hips: None, normal, Overall Severity Severity of abnormal movements (highest score from questions above): None, normal Incapacitation due to abnormal movements: None, normal Patient's awareness of abnormal movements (rate only patient's report): No Awareness, Dental Status Current problems with teeth and/or dentures?: Yes (missing lower teeth) Does patient usually wear dentures?: Yes  CIWA:  CIWA-Ar Total: 4 COWS:     Treatment Plan Summary: Daily contact with patient to assess and evaluate symptoms and progress in  treatment Medication management  Plan: Supportive approach/coping skills/relapse prevention           Consider for D/C in AM to a CD IOP/AA  Medical Decision Making Problem Points:  Review of psycho-social stressors (1) Data Points:  Review of medication regiment & side effects (2) Review of new medications or change in dosage (2)  I certify that inpatient services furnished can reasonably be expected to improve the patient's condition.   Colm Lyford A 12/04/2012, 3:01 PM

## 2012-12-04 NOTE — Progress Notes (Signed)
Adult Psychoeducational Group Note  Date:  12/04/2012 Time:  0900  Group Topic/Focus:  Self inventory sheet  Participation Level:  Active  Participation Quality:  Appropriate  Affect:  Appropriate  Cognitive:  Appropriate  Insight: Appropriate  Engagement in Group:  Engaged  Modes of Intervention:  Discussion    Roselee Culver 12/04/2012, 12:41 PM

## 2012-12-04 NOTE — Progress Notes (Signed)
D.  Pt pleasant on approach, up and more visible within milieu today.  No complaints voiced at this time.  Denies SI/HI/hallucinations at this time.  Positive for evening AA group.  A.  Support and encouragement offered, will continue to monitor.

## 2012-12-04 NOTE — Progress Notes (Signed)
D   Pt has been more engaged in unit activities today  He has attended and participated in group   He reports very minimal signs and symptoms of withdrawal and reports h3e is ready to be discharged A   Verbal support given  Medications administered and effectiveness monitored   Q 15 min checks R  Pt safe at present

## 2012-12-04 NOTE — [facility] Group Notes (Signed)
[facility] LCSW Group Therapy  12/04/2012 10:00-11:00am  Summary of Progress/Problems:  The main focus of today's process group was to consider whether and how to increase/improve support system.  Four definitions of support were discussed and an exercise was utilized to show how much stronger we become with additional supports providing accountability, improved physical and emotional health, and better problem-solving skills.  An emphasis was placed on using counselor, doctor, therapy groups, 12-step groups, and problem-specific support groups to expand supports. The patient expressed that he has 6 siblings who do not drink, so he has a lot of support.  His motivation to seek yet more supports is 8 out of 10.  Type of Therapy:  Group Therapy  Participation Level:  Active  Participation Quality:  Attentive  Affect:  Blunted and Drowsy  Cognitive:  Appropriate  Insight:  Engaged  Engagement in Therapy:  Engaged  Modes of Intervention:  Exploration and Motivational Interviewing  Sarina Ser 12/04/2012, 12:41 PM

## 2012-12-04 NOTE — Progress Notes (Signed)
Psychoeducational Group Note  Date:  12/04/2012 Time:  0945 am  Group Topic/Focus:  Making Healthy Choices:   The focus of this group is to help patients identify negative/unhealthy choices they were using prior to admission and identify positive/healthier coping strategies to replace them upon discharge.  Participation Level:  Minimal  Participation Quality:  Appropriate  Affect:  Blunted  Cognitive:  Alert  Insight:  Limited  Engagement in Group:  Improving  Additional Comments:    Andrena Mews 12/04/2012, 10:29 AM

## 2012-12-04 NOTE — Progress Notes (Signed)
[facility] Group Notes:  (Nursing/MHT/Case Management/Adjunct)  Date:  12/04/2012  Time:  10:30 AM  Type of Therapy:  Therapeutic Activity  Participation Level:  Minimal  Participation Quality:  Appropriate, Attentive and Supportive  Affect:  Appropriate and Flat  Cognitive:  Alert and Appropriate  Insight:  Appropriate and Good  Engagement in Group:  Improving  Modes of Intervention:  Activity  Summary of Progress/Problems: Pt participated in group where pts played "Would you rather". Pt needed to be prompted to participate in group activity. Pt appeared flat and quiet throughout group but appeared to understand the purpose of this group.   [name] 12/04/2012, 10:30 AM

## 2012-12-04 NOTE — Progress Notes (Signed)
[facility] Group Notes:  (Nursing/MHT/Case Management/Adjunct)  Date:  12/03/2012  Time:  2100 Type of Therapy:  wrap up group  Participation Level:  Active  Participation Quality:  Appropriate, Attentive and Sharing  Affect:  Appropriate and Flat  Cognitive:  Alert and Appropriate  Insight:  Appropriate  Engagement in Group:  Engaged  Modes of Intervention:  Clarification, Education and Support  Summary of Progress/Problems: Pt reported wanting to reestablish a relationship with his 51 year old son. Pt spoke with son on the phone today, son was supportive of father getting help.  Pt plans on attending outpatient [facility] 2hr days.  Pt reports living with uncle who is just getting out of hospital for alcohol related seizures but states that the home will be a good place for him to return to.  Shelah Lewandowsky 12/04/2012, 3:08 AM

## 2012-12-05 MED ORDER — TRAZODONE HCL 50 MG PO TABS: 50.0000 mg | ORAL_TABLET | Freq: Every evening | ORAL | Status: DC | PRN

## 2012-12-05 MED ORDER — LISINOPRIL 10 MG PO TABS: 10.0000 mg | ORAL_TABLET | Freq: Every day | ORAL | Status: DC

## 2012-12-05 NOTE — Progress Notes (Signed)
D/C instructions/meds/follow-up appointments reviewed, pt verbalized understanding, pt's belongings returned to pt, samples given, 2 bus passes given.

## 2012-12-05 NOTE — Progress Notes (Signed)
Adult Psychoeducational Group Note  Date:  12/05/2012 Time:  11:02 AM  Group Topic/Focus:  Dimensions of Wellness:   The focus of this group is to introduce the topic of wellness and discuss the role each dimension of wellness plays in total health.  Participation Level:  Active  Participation Quality:  Appropriate and Attentive  Affect:  Appropriate  Cognitive:  Alert and Oriented  Insight: Good  Engagement in Group:  Engaged  Modes of Intervention:  Activity and Discussion  Additional Comments:  Pt was active in group and stated his goal for the day is to discharge.  Todd Tran T 12/05/2012, 11:02 AM

## 2012-12-05 NOTE — Progress Notes (Signed)
[facility] Adult Case Management Discharge Plan :  Will you be returning to the same living situation after discharge: Yes,  Home with Uncle At discharge, do you have transportation home?:Yes,  bus voucher Do you have the ability to pay for your medications:Yes,  help from support group  Release of information consent forms completed and in the chart;  Patient's signature needed at discharge.  Patient to Follow up at: Follow-up Information   Follow [facility] with [facility] (Call for Appointment within the next two days)    Contact information:   [facility] [address]  [address]  [place], [place] [address] Phone [phone]  FAX [phone]      Patient denies SI/HI:   Yes,  denies both    Safety Planning and Suicide Prevention discussed:  No. Patient was not experiencing suicidal ideation at admit or during stay  Clide Dales 12/05/2012, 4:42 PM

## 2012-12-05 NOTE — [facility] Suicide Risk Assessment (Signed)
Suicide Risk Assessment  Discharge Assessment     Demographic Factors:  Male, Low socioeconomic status and Unemployed  Mental Status Per Nursing Assessment::   On Admission:  NA  Current Mental Status by Physician: patient denies suicidal ideation, intent or plan  Loss Factors: Decrease in vocational status, Decline in physical health and Financial problems/change in socioeconomic status  Historical Factors: Family history of mental illness or substance abuse and Impulsivity  Risk Reduction Factors:   Living with another person, especially a relative, Positive social support and Positive therapeutic relationship  Continued Clinical Symptoms:  Alcohol dependence   Cognitive Features That Contribute To Risk:  Closed-mindedness    Suicide Risk:  Minimal: No identifiable suicidal ideation.  Patients presenting with no risk factors but with morbid ruminations; may be classified as minimal risk based on the severity of the depressive symptoms  Discharge Diagnoses:   AXIS I:  Alcohol dependence  AXIS II:  Deferred AXIS III:   Past Medical History  Diagnosis Date  . ETOH abuse   . 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

## 2012-12-05 NOTE — Progress Notes (Signed)
Patient did not attend the evening speaker AA meeting. Pt was in bed sleeping.
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
## 2012-12-05 NOTE — Progress Notes (Signed)
Patient did attend the evening speaker AA meeting.  

## 2012-12-07 NOTE — Discharge Summary (Signed)
Seen and agreed. Bradee Common, MD 

## 2012-12-12 NOTE — Progress Notes (Signed)
Patient Discharge Instructions:  After Visit Summary (AVS):   Faxed to:  12/12/12 Discharge Summary Note:   Faxed to:  12/12/12 Psychiatric Admission Assessment Note:   Faxed to:  12/12/12 Suicide Risk Assessment - Discharge Assessment:   Faxed to:  12/12/12 Faxed/Sent to the Next Level Care provider:  12/12/12 Faxed to [facility] @ [phone]   Jerelene Redden, 12/12/2012, 2:27 PM

## 2012-12-14 ENCOUNTER — Encounter (HOSPITAL_COMMUNITY): Payer: Self-pay | Admitting: *Deleted

## 2012-12-14 ENCOUNTER — Emergency Department (HOSPITAL_COMMUNITY)
Admission: EM | Admit: 2012-12-14 | Discharge: 2012-12-14 | Disposition: A | Discharge status: Home / Self Care | Payer: Self-pay | Attending: Emergency Medicine | Admitting: Emergency Medicine

## 2012-12-14 DIAGNOSIS — Z8669 Personal history of other diseases of the nervous system and sense organs: Secondary | ICD-10-CM | POA: Insufficient documentation

## 2012-12-14 DIAGNOSIS — F17200 Nicotine dependence, unspecified, uncomplicated: Secondary | ICD-10-CM | POA: Insufficient documentation

## 2012-12-14 DIAGNOSIS — F1010 Alcohol abuse, uncomplicated: Secondary | ICD-10-CM | POA: Insufficient documentation

## 2012-12-14 DIAGNOSIS — F10929 Alcohol use, unspecified with intoxication, unspecified: Secondary | ICD-10-CM

## 2012-12-14 DIAGNOSIS — Z79899 Other long term (current) drug therapy: Secondary | ICD-10-CM | POA: Insufficient documentation

## 2012-12-14 LAB — CBC WITH DIFFERENTIAL/PLATELET
Basophils Relative: 2 % — ABNORMAL HIGH (ref 0–1)
HCT: 43.6 % (ref 39.0–52.0)
Lymphs Abs: 2.7 10*3/uL (ref 0.7–4.0)
MCH: 30.6 pg (ref 26.0–34.0)
MCV: 90.8 fL (ref 78.0–100.0)
Monocytes Relative: 6 % (ref 3–12)
Neutro Abs: 2.1 10*3/uL (ref 1.7–7.7)
Neutrophils Relative %: 40 % — ABNORMAL LOW (ref 43–77)

## 2012-12-14 LAB — RAPID URINE DRUG SCREEN, HOSP PERFORMED
Amphetamines: NOT DETECTED
Barbiturates: NOT DETECTED
Tetrahydrocannabinol: NOT DETECTED

## 2012-12-14 LAB — URINALYSIS, ROUTINE W REFLEX MICROSCOPIC
Bilirubin Urine: NEGATIVE
Glucose, UA: NEGATIVE mg/dL
Hgb urine dipstick: NEGATIVE
Ketones, ur: NEGATIVE mg/dL
Leukocytes, UA: NEGATIVE
Protein, ur: NEGATIVE mg/dL

## 2012-12-14 LAB — COMPREHENSIVE METABOLIC PANEL
Albumin: 3.6 g/dL (ref 3.5–5.2)
Alkaline Phosphatase: 98 U/L (ref 39–117)
BUN: 5 mg/dL — ABNORMAL LOW (ref 6–23)
Chloride: 102 mEq/L (ref 96–112)
Potassium: 4.3 mEq/L (ref 3.5–5.1)
Total Bilirubin: 0.3 mg/dL (ref 0.3–1.2)

## 2012-12-14 LAB — ETHANOL: Alcohol, Ethyl (B): 397 mg/dL — ABNORMAL HIGH (ref 0–11)
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
## 2012-12-14 NOTE — ED Notes (Signed)
Placed yellow arm band on pt; red socks on as well.

## 2012-12-14 NOTE — ED Notes (Signed)
Pt brought in by [employer], pt requesting detox from [facility].  Pt reports drinking wine since early this am.  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  Pt is calm and cooperative at this time.

## 2012-12-14 NOTE — ED Provider Notes (Signed)
Patient is awake, alert, ambulatory.  He speaks clearly, interacting appropriately. He states that he would like assistance with alcohol abuse counseling.  He has outpatient followup instructions at home.  He has no new complaints, no new physical findings, vital signs are stable.  The patient was discharged to followup with outpatient alcohol abuse center.  Gerhard Munch, MD 12/14/12 [phone]

## 2012-12-14 NOTE — ED Notes (Addendum)
Patient in blue scrubs and red socks. Patient and belongings both wanded by security. Patient has 1 bag of belongings located behind nurses station.

## 2012-12-24 ENCOUNTER — Emergency Department (HOSPITAL_COMMUNITY): Payer: Self-pay

## 2012-12-24 ENCOUNTER — Emergency Department (HOSPITAL_COMMUNITY)
Admission: EM | Admit: 2012-12-24 | Discharge: 2012-12-25 | Disposition: A | Discharge status: Home / Self Care | Payer: Self-pay | Attending: Emergency Medicine | Admitting: Emergency Medicine

## 2012-12-24 ENCOUNTER — Encounter (HOSPITAL_COMMUNITY): Payer: Self-pay | Admitting: Emergency Medicine

## 2012-12-24 DIAGNOSIS — Y9389 Activity, other specified: Secondary | ICD-10-CM | POA: Insufficient documentation

## 2012-12-24 DIAGNOSIS — Z79899 Other long term (current) drug therapy: Secondary | ICD-10-CM | POA: Insufficient documentation

## 2012-12-24 DIAGNOSIS — M545 Low back pain, unspecified: Secondary | ICD-10-CM | POA: Insufficient documentation

## 2012-12-24 DIAGNOSIS — F10229 Alcohol dependence with intoxication, unspecified: Secondary | ICD-10-CM | POA: Insufficient documentation

## 2012-12-24 DIAGNOSIS — I10 Essential (primary) hypertension: Secondary | ICD-10-CM | POA: Insufficient documentation

## 2012-12-24 DIAGNOSIS — F1020 Alcohol dependence, uncomplicated: Secondary | ICD-10-CM

## 2012-12-24 DIAGNOSIS — Q159 Congenital malformation of eye, unspecified: Secondary | ICD-10-CM | POA: Insufficient documentation

## 2012-12-24 DIAGNOSIS — F17200 Nicotine dependence, unspecified, uncomplicated: Secondary | ICD-10-CM | POA: Insufficient documentation

## 2012-12-24 DIAGNOSIS — Y929 Unspecified place or not applicable: Secondary | ICD-10-CM | POA: Insufficient documentation

## 2012-12-24 DIAGNOSIS — W19XXXA Unspecified fall, initial encounter: Secondary | ICD-10-CM | POA: Insufficient documentation

## 2012-12-24 DIAGNOSIS — F10129 Alcohol abuse with intoxication, unspecified: Secondary | ICD-10-CM

## 2012-12-24 HISTORY — DX: Essential (primary) hypertension: I10

## 2012-12-24 LAB — CBC WITH DIFFERENTIAL/PLATELET
Basophils Absolute: 0 10*3/uL (ref 0.0–0.1)
Basophils Relative: 0 % (ref 0–1)
Eosinophils Absolute: 0.2 10*3/uL (ref 0.0–0.7)
Eosinophils Relative: 3 % (ref 0–5)
HCT: 41.3 % (ref 39.0–52.0)
Hemoglobin: 14 g/dL (ref 13.0–17.0)
Lymphocytes Relative: 45 % (ref 12–46)
Lymphs Abs: 2.7 10*3/uL (ref 0.7–4.0)
MCH: 30.9 pg (ref 26.0–34.0)
MCHC: 33.9 g/dL (ref 30.0–36.0)
MCV: 91.2 fL (ref 78.0–100.0)
Monocytes Absolute: 0.5 10*3/uL (ref 0.1–1.0)
Monocytes Relative: 8 % (ref 3–12)
Neutro Abs: 2.6 10*3/uL (ref 1.7–7.7)
Neutrophils Relative %: 43 % (ref 43–77)
Platelets: 125 10*3/uL — ABNORMAL LOW (ref 150–400)
RBC: 4.53 MIL/uL (ref 4.22–5.81)
RDW: 14.4 % (ref 11.5–15.5)
WBC: 6 10*3/uL (ref 4.0–10.5)

## 2012-12-24 LAB — BASIC METABOLIC PANEL
Chloride: 102 mEq/L (ref 96–112)
Creatinine, Ser: 0.96 mg/dL (ref 0.50–1.35)
GFR calc Af Amer: >90 mL/min (ref >90)
GFR calc non Af Amer: >90 mL/min (ref >90)
Potassium: 3.7 mEq/L (ref 3.5–5.1)

## 2012-12-24 LAB — ETHANOL: Alcohol, Ethyl (B): 408 mg/dL (ref 0–11)

## 2012-12-24 NOTE — ED Notes (Signed)
Patient came in via EMS - The patient reports that he has been drinking and fell with lower back pain

## 2012-12-24 NOTE — ED Provider Notes (Signed)
History     CSN: [id number]  Arrival date & time 12/24/12  2101   First MD Initiated Contact with Patient 12/24/12 2207      Chief Complaint  Patient presents with  . Tailbone Pain  . Alcohol Intoxication    (Consider location/radiation/quality/duration/timing/severity/associated sxs/prior treatment) Patient is a 52 y.o. male presenting with intoxication and syncope. The history is provided by the patient.  Alcohol Intoxication This is a chronic problem. The problem occurs constantly. The problem has not changed since onset.Pertinent negatives include no chest pain, no abdominal pain, no headaches and no shortness of breath. Nothing aggravates the symptoms. Nothing relieves the symptoms. He has tried nothing for the symptoms.  Loss of Consciousness  This is a new problem. The current episode started less than 1 hour ago. The problem occurs constantly. The problem has been resolved. Length of episode of loss of consciousness: unknown. Associated with: states he has been drinking beer and wine today and then was standing up to go to the bathroom and that is all he can remember. Pertinent negatives include abdominal pain, chest pain, headaches, nausea, vomiting and weakness. He has tried nothing for the symptoms. The treatment provided significant relief. His past medical history is significant for HTN. His past medical history does not include CAD, DM or TIA. Past medical history comments: chronic EtOH abuse.    Past Medical History  Diagnosis Date  . ETOH abuse   . 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 Hypertension     History reviewed. No pertinent past surgical history.  History reviewed. No pertinent family history.  History  Substance Use Topics  . Smoking status: Current Some Day Smoker -- 0.25 packs/day    Types: Cigarettes  . Smokeless tobacco: Not on file  . Alcohol Use: Yes     Comment: drinks beer and  wine daily      Review of Systems  Respiratory: Negative for shortness of breath.   Cardiovascular: Positive for syncope. Negative for chest pain.  Gastrointestinal: Negative for nausea, vomiting and abdominal pain.  Neurological: Negative for weakness and headaches.  All other systems reviewed and are negative.    Allergies  Review of patient's allergies indicates no known allergies.  Home Medications   Current Outpatient Rx  Name  Route  Sig  Dispense  Refill  . lisinopril (PRINIVIL,ZESTRIL) 10 MG tablet   Oral   Take 1 tablet (10 mg total) by mouth daily. For high blood pressure control   30 tablet   0   . traZODone (DESYREL) 50 MG tablet   Oral   Take 1 tablet (50 mg total) by mouth at bedtime as needed and may repeat dose one time if needed for sleep. For depression/sleep   60 tablet   0     BP 154/94  Pulse 102  Temp(Src) 97.7 F (36.5 C) (Oral)  Resp 20  SpO2 100%  Physical Exam  Nursing note and vitals reviewed. Constitutional: He is oriented to person, place, and time. He appears well-developed and well-nourished. No distress.  HENT:  Head: Normocephalic and atraumatic.  Mouth/Throat: Oropharynx is clear and moist.  Eyes: Conjunctivae are normal. Pupils are equal, round, and reactive to light.  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 Neck supple.  Cardiovascular: Normal rate, regular rhythm and intact distal pulses.   No murmur heard. Pulmonary/Chest: Effort normal and breath  sounds normal. No respiratory distress. He has no wheezes. He has no rales.  Abdominal: Soft. He exhibits no distension. There is no tenderness. There is no rebound and no guarding.  Musculoskeletal: Normal range of motion. He exhibits tenderness. He exhibits no edema.       Lumbar back: He exhibits tenderness. He exhibits no swelling and no deformity.       Back:  Neurological: He is alert and oriented to person, place, and time.  Skin: Skin is warm and  dry. No rash noted. No erythema.  Psychiatric: He has a normal mood and affect. His behavior is normal.    ED Course  Procedures (including critical care time)  Labs Reviewed  ETHANOL - Abnormal; Notable for the following:    Alcohol, Ethyl (B) 408 (*)    All other components within normal limits  CBC WITH DIFFERENTIAL - Abnormal; Notable for the following:    Platelets 125 (*)    All other components within normal limits  BASIC METABOLIC PANEL - Abnormal; Notable for the following:    BUN 5 (*)    All other components within normal limits   No results found.   Date: 12/24/2012  Rate: 86  Rhythm: normal sinus rhythm  QRS Axis: normal  Intervals: normal, unusual p-waves diffusely  ST/T Wave abnormalities: normal  Conduction Disutrbances:none  Narrative Interpretation:   Old EKG Reviewed: unchanged   No diagnosis found.    MDM   Patient states that he had a syncopal episode today while he was getting up to walk to the bathroom. Patient is a chronic alcohol abuser and also admits to drinking tonight. His blood alcohol today is 408 however when I go to examine him the patient is alert speaking in for this including a fairly accurate history. Syncopal events he complains of lower back pain but denies any headache or neck pain. He denies any nausea or vomiting and has no abdominal pain on exam. He is neurovascularly intact. He does have a history of hypertension.  CBC, BMP, chest x-ray and EKG pending however feel patient's syncopal episode is due to his intoxication and low suspicion for cardiac etiology at this time injury from syncopal event.  11:29 PM Labs wnl.  EKG without acute changes.      Gwyneth Sprout, MD 12/24/12 2329

## 2012-12-25 NOTE — ED Notes (Signed)
Pt received from [place] RN [phone]

## 2012-12-25 NOTE — ED Provider Notes (Signed)
Pt care assumed from Dr Anitra Lauth.  Pt with significant alcohol intoxication,h/o same.  Had fall vs syncope, but thought to be due to alcohol.  Pt is awake now, has ambulated.  Reports he has tried detox with [facility], but it didn't take.  Does not want detox at this time.  Olivia Mackie, MD 12/25/12 [phone]

## 2012-12-27 ENCOUNTER — Encounter (HOSPITAL_COMMUNITY): Payer: Self-pay | Admitting: Emergency Medicine

## 2012-12-27 ENCOUNTER — Emergency Department (HOSPITAL_COMMUNITY)
Admission: EM | Admit: 2012-12-27 | Discharge: 2012-12-28 | Disposition: A | Discharge status: Home / Self Care | Payer: Self-pay | Attending: Emergency Medicine | Admitting: Emergency Medicine

## 2012-12-27 ENCOUNTER — Emergency Department (HOSPITAL_COMMUNITY): Payer: Self-pay

## 2012-12-27 DIAGNOSIS — W230XXA Caught, crushed, jammed, or pinched between moving objects, initial encounter: Secondary | ICD-10-CM | POA: Insufficient documentation

## 2012-12-27 DIAGNOSIS — F10129 Alcohol abuse with intoxication, unspecified: Secondary | ICD-10-CM

## 2012-12-27 DIAGNOSIS — Z79899 Other long term (current) drug therapy: Secondary | ICD-10-CM | POA: Insufficient documentation

## 2012-12-27 DIAGNOSIS — Z8669 Personal history of other diseases of the nervous system and sense organs: Secondary | ICD-10-CM | POA: Insufficient documentation

## 2012-12-27 DIAGNOSIS — S8012XA Contusion of left lower leg, initial encounter: Secondary | ICD-10-CM

## 2012-12-27 DIAGNOSIS — S8010XA Contusion of unspecified lower leg, initial encounter: Secondary | ICD-10-CM | POA: Insufficient documentation

## 2012-12-27 DIAGNOSIS — F17200 Nicotine dependence, unspecified, uncomplicated: Secondary | ICD-10-CM | POA: Insufficient documentation

## 2012-12-27 DIAGNOSIS — I10 Essential (primary) hypertension: Secondary | ICD-10-CM | POA: Insufficient documentation

## 2012-12-27 DIAGNOSIS — Y929 Unspecified place or not applicable: Secondary | ICD-10-CM | POA: Insufficient documentation

## 2012-12-27 DIAGNOSIS — F1010 Alcohol abuse, uncomplicated: Secondary | ICD-10-CM | POA: Insufficient documentation

## 2012-12-27 DIAGNOSIS — Y939 Activity, unspecified: Secondary | ICD-10-CM | POA: Insufficient documentation
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
## 2012-12-27 NOTE — ED Notes (Signed)
Pt reports getting his foot stuck in a moving trailer 2 days ago. Obvious swelling to the affected extremity. Pt is able to ambulate on affected leg. Patient alertx4, NAD. States he is currently intoxicated.

## 2012-12-28 MED ORDER — IBUPROFEN 600 MG PO TABS: 600.0000 mg | ORAL_TABLET | Freq: Four times a day (QID) | ORAL | Status: DC | PRN

## 2012-12-28 NOTE — ED Provider Notes (Signed)
Medical screening examination/treatment/procedure(s) were performed by non-physician practitioner and as supervising physician I was immediately available for consultation/collaboration.  Roark Rufo L Ski Polich, MD 12/28/12 0200 

## 2012-12-31 ENCOUNTER — Encounter (HOSPITAL_COMMUNITY): Payer: Self-pay | Admitting: *Deleted

## 2012-12-31 ENCOUNTER — Emergency Department (HOSPITAL_COMMUNITY)
Admission: EM | Admit: 2012-12-31 | Discharge: 2013-01-01 | Disposition: A | Discharge status: Home / Self Care | Payer: Self-pay | Attending: Emergency Medicine | Admitting: Emergency Medicine

## 2012-12-31 ENCOUNTER — Emergency Department (HOSPITAL_COMMUNITY)
Admission: EM | Admit: 2012-12-31 | Discharge: 2012-12-31 | Disposition: A | Discharge status: Home / Self Care | Payer: Self-pay | Attending: Emergency Medicine | Admitting: Emergency Medicine

## 2012-12-31 ENCOUNTER — Emergency Department (HOSPITAL_COMMUNITY): Payer: Self-pay

## 2012-12-31 ENCOUNTER — Encounter (HOSPITAL_COMMUNITY): Payer: Self-pay | Admitting: Emergency Medicine

## 2012-12-31 DIAGNOSIS — F10929 Alcohol use, unspecified with intoxication, unspecified: Secondary | ICD-10-CM

## 2012-12-31 DIAGNOSIS — Q159 Congenital malformation of eye, unspecified: Secondary | ICD-10-CM | POA: Insufficient documentation

## 2012-12-31 DIAGNOSIS — S0993XA Unspecified injury of face, initial encounter: Secondary | ICD-10-CM | POA: Insufficient documentation

## 2012-12-31 DIAGNOSIS — S0003XA Contusion of scalp, initial encounter: Secondary | ICD-10-CM | POA: Insufficient documentation

## 2012-12-31 DIAGNOSIS — F1010 Alcohol abuse, uncomplicated: Secondary | ICD-10-CM | POA: Insufficient documentation

## 2012-12-31 DIAGNOSIS — Y92009 Unspecified place in unspecified non-institutional (private) residence as the place of occurrence of the external cause: Secondary | ICD-10-CM | POA: Insufficient documentation

## 2012-12-31 DIAGNOSIS — IMO0002 Reserved for concepts with insufficient information to code with codable children: Secondary | ICD-10-CM | POA: Insufficient documentation

## 2012-12-31 DIAGNOSIS — F10129 Alcohol abuse with intoxication, unspecified: Secondary | ICD-10-CM

## 2012-12-31 DIAGNOSIS — H538 Other visual disturbances: Secondary | ICD-10-CM | POA: Insufficient documentation

## 2012-12-31 DIAGNOSIS — W108XXA Fall (on) (from) other stairs and steps, initial encounter: Secondary | ICD-10-CM | POA: Insufficient documentation

## 2012-12-31 DIAGNOSIS — F10229 Alcohol dependence with intoxication, unspecified: Secondary | ICD-10-CM | POA: Insufficient documentation

## 2012-12-31 DIAGNOSIS — M1710 Unilateral primary osteoarthritis, unspecified knee: Secondary | ICD-10-CM | POA: Insufficient documentation

## 2012-12-31 DIAGNOSIS — Y998 Other external cause status: Secondary | ICD-10-CM | POA: Insufficient documentation

## 2012-12-31 DIAGNOSIS — F17200 Nicotine dependence, unspecified, uncomplicated: Secondary | ICD-10-CM | POA: Insufficient documentation

## 2012-12-31 DIAGNOSIS — S1093XA Contusion of unspecified part of neck, initial encounter: Secondary | ICD-10-CM | POA: Insufficient documentation

## 2012-12-31 DIAGNOSIS — S0100XA Unspecified open wound of scalp, initial encounter: Secondary | ICD-10-CM | POA: Insufficient documentation

## 2012-12-31 DIAGNOSIS — S199XXA Unspecified injury of neck, initial encounter: Secondary | ICD-10-CM | POA: Insufficient documentation

## 2012-12-31 DIAGNOSIS — I10 Essential (primary) hypertension: Secondary | ICD-10-CM | POA: Insufficient documentation

## 2012-12-31 DIAGNOSIS — Z79899 Other long term (current) drug therapy: Secondary | ICD-10-CM | POA: Insufficient documentation

## 2012-12-31 DIAGNOSIS — S0093XA Contusion of unspecified part of head, initial encounter: Secondary | ICD-10-CM

## 2012-12-31 DIAGNOSIS — E876 Hypokalemia: Secondary | ICD-10-CM | POA: Insufficient documentation

## 2012-12-31 DIAGNOSIS — Y9389 Activity, other specified: Secondary | ICD-10-CM | POA: Insufficient documentation

## 2012-12-31 LAB — CBC WITH DIFFERENTIAL/PLATELET
Basophils Absolute: 0 10*3/uL (ref 0.0–0.1)
Basophils Relative: 1 % (ref 0–1)
HCT: 44.2 % (ref 39.0–52.0)
Hemoglobin: 15.4 g/dL (ref 13.0–17.0)
Lymphocytes Relative: 51 % — ABNORMAL HIGH (ref 12–46)
Monocytes Absolute: 0.4 10*3/uL (ref 0.1–1.0)
Neutro Abs: 2.4 10*3/uL (ref 1.7–7.7)
Neutrophils Relative %: 40 % — ABNORMAL LOW (ref 43–77)
RDW: 14.5 % (ref 11.5–15.5)
WBC: 6 10*3/uL (ref 4.0–10.5)

## 2012-12-31 LAB — RAPID URINE DRUG SCREEN, HOSP PERFORMED
Barbiturates: NOT DETECTED
Cocaine: NOT DETECTED
Tetrahydrocannabinol: NOT DETECTED

## 2012-12-31 LAB — COMPREHENSIVE METABOLIC PANEL
ALT: 40 U/L (ref 0–53)
AST: 85 U/L — ABNORMAL HIGH (ref 0–37)
Albumin: 3.8 g/dL (ref 3.5–5.2)
Alkaline Phosphatase: 119 U/L — ABNORMAL HIGH (ref 39–117)
CO2: 31 mEq/L (ref 19–32)
Chloride: 102 mEq/L (ref 96–112)
Creatinine, Ser: 0.84 mg/dL (ref 0.50–1.35)
GFR calc non Af Amer: >90 mL/min (ref >90)
Potassium: 3.3 mEq/L — ABNORMAL LOW (ref 3.5–5.1)
Total Bilirubin: 0.3 mg/dL (ref 0.3–1.2)

## 2012-12-31 LAB — ETHANOL: Alcohol, Ethyl (B): 485 mg/dL (ref 0–11)

## 2012-12-31 LAB — SALICYLATE LEVEL: Salicylate Lvl: <2 mg/dL — ABNORMAL LOW (ref 2.8–20.0)

## 2012-12-31 MED ORDER — LORAZEPAM 1 MG PO TABS
1.0000 mg | ORAL_TABLET | Freq: Three times a day (TID) | ORAL | Status: DC | PRN
  Administered 2013-01-01: 1 mg via ORAL
  Filled 2012-12-31: qty 1

## 2012-12-31 MED ORDER — POTASSIUM CHLORIDE CRYS ER 20 MEQ PO TBCR
40.0000 meq | EXTENDED_RELEASE_TABLET | Freq: Once | ORAL | Status: AC
  Administered 2012-12-31: 40 meq via ORAL
  Filled 2012-12-31: qty 2

## 2012-12-31 MED ORDER — ALUM & MAG HYDROXIDE-SIMETH 200-200-20 MG/5ML PO SUSP: 30.0000 mL | ORAL | Status: DC | PRN

## 2012-12-31 MED ORDER — ACETAMINOPHEN 325 MG PO TABS: 650.0000 mg | ORAL_TABLET | ORAL | Status: DC | PRN

## 2012-12-31 MED ORDER — ZOLPIDEM TARTRATE 5 MG PO TABS: 5.0000 mg | ORAL_TABLET | Freq: Every evening | ORAL | Status: DC | PRN

## 2012-12-31 MED ORDER — NICOTINE 21 MG/24HR TD PT24
21.0000 mg | MEDICATED_PATCH | Freq: Every day | TRANSDERMAL | Status: DC
  Administered 2012-12-31: 21 mg via TRANSDERMAL
  Filled 2012-12-31: qty 1

## 2012-12-31 MED ORDER — IBUPROFEN 400 MG PO TABS: 600.0000 mg | ORAL_TABLET | Freq: Three times a day (TID) | ORAL | Status: DC | PRN

## 2012-12-31 MED ORDER — ONDANSETRON HCL 4 MG PO TABS: 4.0000 mg | ORAL_TABLET | Freq: Three times a day (TID) | ORAL | Status: DC | PRN

## 2012-12-31 NOTE — ED Notes (Signed)
Regular diet tray ordered for pt.

## 2012-12-31 NOTE — ED Provider Notes (Signed)
Patient seen/examined in the Emergency Department in conjunction with Resident Physician Provider Berline Chough Patient reports fall, and reports etoh use Exam : awake/alert, no chest/abdominal/back tenderness.  He reports pain to left LE Plan: imaging, and if negative and he ambulates, stable for d/c   Joya Gaskins, MD 12/31/12 6045

## 2012-12-31 NOTE — ED Notes (Signed)
Here for detox from alcohol and states he last drank about 5pm

## 2012-12-31 NOTE — ED Notes (Signed)
etoh 487 called from lab, edpa aware

## 2012-12-31 NOTE — ED Notes (Signed)
The pt is currently asleep and is hard to awaken.  His brother has been calling him  To talk with him

## 2012-12-31 NOTE — ED Provider Notes (Signed)
History    This chart was scribed for Glade Nurse (PA) non-physician practitioner working with Todd Cooper III, MD by Sofie Rower, ED Scribe. This patient was seen in room TR09C/TR09C and the patient's care was started at 5:19PM.   CSN: [id number]  Arrival date & time 12/31/12  1527   First MD Initiated Contact with Patient 12/31/12 1719      Chief Complaint  Patient presents with  . Medical Clearance    (Consider location/radiation/quality/duration/timing/severity/associated sxs/prior treatment) The history is provided by the patient. No language interpreter was used.    CARVEL HUSKINS is a 52 y.o. male , with a hx of ETOH abuse, right eye abnormality, blurred vision, arthritis located at the left lower extremity and hypertension, who presents to the Emergency Department complaining of chronic, progressively worsening, ETOH abuse and requesting inpatient detox assistance. The pt has been previously evaluated at [facility] earlier today when he was found by friends with large hematoma. He apparently had fallen up 3 or 4 stairs of his porch and hit his head. Pt admits to being intoxicated at the time. Denies other drug use. Pertinent negatives include no fever, no numbness, no abdominal pain, no nausea, no vomiting, no hearing loss and no loss of consciousness (unknown but non reported).   This is the pt's 9th ED visit within the last 5 weeks. On 12/14/12 the pt was given outpatient resources and has since then been evaluated on 12/24/12, 12/28/12, and 12/31/12. Additionally, the pt refused alcohol abuse rehab on 11/25/12 and 11/27/12. Furthermore, the pt denies use of any recreational drugs.  The pt informs that today's visit is different from previous evaluations, due to his strong desire to change. The pt reports he is seeking and desires support throughout the detoxification process.  Pt was says he was sober for 7 days earlier in the month at an inpt detox when he had to leave to go to court on his  birthday, 5/6, and then he went out to "celebrate his day" and got intoxicated.   The pt denies vomiting, headache, chest pain, shortness of breath, and SI/HI.  The pt is a current some day smoker, in addition to drinking alcohol daily.   Pt does not have a PCP.    Past Medical History  Diagnosis Date  . ETOH abuse   . 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 Hypertension     History reviewed. No pertinent past surgical history.  History reviewed. No pertinent family history.  History  Substance Use Topics  . Smoking status: Current Some Day Smoker -- 0.25 packs/day    Types: Cigarettes  . Smokeless tobacco: Not on file  . Alcohol Use: Yes     Comment: drinks beer and wine daily      Review of Systems  Constitutional: Negative for fever and diaphoresis.  HENT: Negative for neck pain and neck stiffness.   Eyes: Negative for visual disturbance.       Pt states he has blurred vision as he is in need of glasses and is supposed to get them next week through [facility].   Respiratory: Negative for apnea, chest tightness and shortness of breath.   Cardiovascular: Negative for chest pain and palpitations.  Gastrointestinal: Negative for nausea, vomiting, diarrhea and constipation.  Genitourinary: Negative for dysuria.  Musculoskeletal: Negative for gait problem.  Skin: Positive for wound. Negative for rash.  Neurological: Negative  for dizziness, weakness, light-headedness, numbness and headaches.    Allergies  Review of patient's allergies indicates no known allergies.  Home Medications   Current Outpatient Rx  Name  Route  Sig  Dispense  Refill  . ibuprofen (ADVIL,MOTRIN) 600 MG tablet   Oral   Take 1 tablet (600 mg total) by mouth every 6 (six) hours as needed for pain.   30 tablet   0   . lisinopril (PRINIVIL,ZESTRIL) 10 MG tablet   Oral   Take 1 tablet (10 mg total) by mouth daily. For high  blood pressure control   30 tablet   0   . traZODone (DESYREL) 50 MG tablet   Oral   Take 1 tablet (50 mg total) by mouth at bedtime as needed and may repeat dose one time if needed for sleep. For depression/sleep   60 tablet   0     BP 137/102  Pulse 113  Temp(Src) 98.3 F (36.8 C) (Oral)  Resp 14  SpO2 98%  Physical Exam  Nursing note and vitals reviewed. Constitutional: He is oriented to person, place, and time. He appears well-developed and well-nourished. No distress.  HENT:  Head: Normocephalic and atraumatic.    Swelling and laceration located at the right temporal lobe  Eyes: Conjunctivae and EOM are normal.  Neck: Normal range of motion. Neck supple.  No meningeal signs  Cardiovascular: Normal rate, regular rhythm and normal heart sounds.  Exam reveals no gallop and no friction rub.   No murmur heard. Pulmonary/Chest: Effort normal and breath sounds normal. No respiratory distress. He has no wheezes. He has no rales. He exhibits no tenderness.  Abdominal: Soft. Bowel sounds are normal. He exhibits no distension. There is no tenderness. There is no rebound and no guarding.  Musculoskeletal: Normal range of motion. He exhibits no edema and no tenderness.  Neurological: He is alert and oriented to person, place, and time. No cranial nerve deficit.  Skin: Skin is warm and dry. Laceration noted. He is not diaphoretic. No erythema.  Some mild abrasions to left lower leg. No bleeding. No repair needed.   Psychiatric:  Pt appeared intoxicating, talking circumferentially and slurring his words.     ED Course  Procedures (including critical care time)  DIAGNOSTIC STUDIES: Oxygen Saturation is 98% on room air, normal by my interpretation.    COORDINATION OF CARE:  5:33 PM- Treatment plan discussed with patient. Pt agrees with treatment.      Results for orders placed during the hospital encounter of 12/31/12  CBC WITH DIFFERENTIAL      Result Value Range   WBC  6.0  4.0 - 10.5 K/uL   RBC 4.86  4.22 - 5.81 MIL/uL   Hemoglobin 15.4  13.0 - 17.0 g/dL   HCT 16.1  09.6 - 04.5 %   MCV 90.9  78.0 - 100.0 fL   MCH 31.7  26.0 - 34.0 pg   MCHC 34.8  30.0 - 36.0 g/dL   RDW 40.9  81.1 - 91.4 %   Platelets 117 (*) 150 - 400 K/uL   Neutrophils Relative 40 (*) 43 - 77 %   Neutro Abs 2.4  1.7 - 7.7 K/uL   Lymphocytes Relative 51 (*) 12 - 46 %   Lymphs Abs 3.0  0.7 - 4.0 K/uL   Monocytes Relative 7  3 - 12 %   Monocytes Absolute 0.4  0.1 - 1.0 K/uL   Eosinophils Relative 2  0 - 5 %  Eosinophils Absolute 0.1  0.0 - 0.7 K/uL   Basophils Relative 1  0 - 1 %   Basophils Absolute 0.0  0.0 - 0.1 K/uL  COMPREHENSIVE METABOLIC PANEL      Result Value Range   Sodium 144  135 - 145 mEq/L   Potassium 3.3 (*) 3.5 - 5.1 mEq/L   Chloride 102  96 - 112 mEq/L   CO2 31  19 - 32 mEq/L   Glucose, Bld 101 (*) 70 - 99 mg/dL   BUN <3 (*) 6 - 23 mg/dL   Creatinine, Ser 1.61  0.50 - 1.35 mg/dL   Calcium 9.2  8.4 - 09.6 mg/dL   Total Protein 9.0 (*) 6.0 - 8.3 g/dL   Albumin 3.8  3.5 - 5.2 g/dL   AST 85 (*) 0 - 37 U/L   ALT 40  0 - 53 U/L   Alkaline Phosphatase 119 (*) 39 - 117 U/L   Total Bilirubin 0.3  0.3 - 1.2 mg/dL   GFR calc non Af Amer >90  >90 mL/min   GFR calc Af Amer >90  >90 mL/min  ETHANOL      Result Value Range   Alcohol, Ethyl (B) 485 (*) 0 - 11 mg/dL  ACETAMINOPHEN LEVEL      Result Value Range   Acetaminophen (Tylenol), Serum <15.0  10 - 30 ug/mL  SALICYLATE LEVEL      Result Value Range   Salicylate Lvl <2.0 (*) 2.8 - 20.0 mg/dL       Dg Tibia/fibula Left  12/31/2012  *RADIOLOGY REPORT*  Clinical Data: Fall, contusion.  LEFT TIBIA AND FIBULA - 2 VIEW  Comparison: Plain films 12/27/2012.  Findings: No acute bony or joint abnormality is identified. Bipartite patella is noted.  Scattered atherosclerosis is seen.  IMPRESSION: No acute finding.   Original Report Authenticated By: Holley Dexter, M.D.     Ct Head Wo Contrast  12/31/2012   *RADIOLOGY REPORT*  Clinical Data:  Status post fall.  For better region.  CT HEAD WITHOUT CONTRAST CT CERVICAL SPINE WITHOUT CONTRAST  Technique:  Multidetector CT imaging of the head and cervical spine was performed following the standard protocol without intravenous contrast.  Multiplanar CT image reconstructions of the cervical spine were also generated.  Comparison:  Headache, cervical and maxillofacial CT scan 06/26/2011.  CT HEAD  Findings: Soft tissue contusion is seen over the right frontal bone.  The brain demonstrates some cortical atrophy but no evidence of acute intracranial abnormality including infarction, hemorrhage, mass lesion, mass effect, midline shift or abnormal extra-axial fluid collection is identified.  There is an unchanged defect in the right occipital bone consistent with a large arachnoid granulation.  The calvarium is intact.  IMPRESSION: Soft tissue contusion over the frontal bone without underlying fracture or acute intracranial abnormality.  CT CERVICAL SPINE  Findings: There is no fracture or subluxation of the cervical spine.  Scattered, mild anterior endplate spurring is noted.  Lung apices are clear.  IMPRESSION: No acute finding.   Original Report Authenticated By: Holley Dexter, M.D.    Ct Cervical Spine Wo Contrast  12/31/2012  *RADIOLOGY REPORT*  Clinical Data:  Status post fall.  For better region.  CT HEAD WITHOUT CONTRAST CT CERVICAL SPINE WITHOUT CONTRAST  Technique:  Multidetector CT imaging of the head and cervical spine was performed following the standard protocol without intravenous contrast.  Multiplanar CT image reconstructions of the cervical spine were also generated.  Comparison:  Headache, cervical and maxillofacial CT  scan 06/26/2011.  CT HEAD  Findings: Soft tissue contusion is seen over the right frontal bone.  The brain demonstrates some cortical atrophy but no evidence of acute intracranial abnormality including infarction, hemorrhage, mass lesion,  mass effect, midline shift or abnormal extra-axial fluid collection is identified.  There is an unchanged defect in the right occipital bone consistent with a large arachnoid granulation.  The calvarium is intact.  IMPRESSION: Soft tissue contusion over the frontal bone without underlying fracture or acute intracranial abnormality.  CT CERVICAL SPINE  Findings: There is no fracture or subluxation of the cervical spine.  Scattered, mild anterior endplate spurring is noted.  Lung apices are clear.  IMPRESSION: No acute finding.   Original Report Authenticated By: Holley Dexter, M.D.       No diagnosis found.    MDM  Pt presents to the ED for medical clearance.  Pt is not currently having SI or HI ideations. The patient presents as somewhat intoxicated with an ETOH 485 and positive for benzos. Pt admits to falling down the porch steps while intoxicated last night and was imaged with a CT of the head and c-spine, as well as of the left tib-fib secondary to his complaint of pain.   Imaging was negative for any acute findings. Other than that, the patient currently does not have any acute physical complaints and is in no acute distress.   Labs note mild hypokalemia. Will order potassium, but hold off until pt detoxes. BUN is at less than 3. Pt has a hx of returning lab values between 3 and 5. Pt also returning elevated AST and Alk Phos which is consistent with his labs taken 5 times over the last month.   Will request ACT consult as pt states he was doing well during inpt stay, but with the loss of support, he was not ready to be on his own and started drinking again.Psych hold orders placed.  Pt will be moved to Psych ED for further evaluation.  I personally performed the services described in this documentation, which was scribed in my presence. The recorded information has been reviewed and is accurate.    Glade Nurse, PA-C 12/31/12 1758  Glade Nurse, PA-C 12/31/12 2326

## 2012-12-31 NOTE — ED Notes (Signed)
Pt brought to ED by EMS from home after fall sometime today, friends at home reported seeing swollen area to forehead, friends at home report altered behavior. Pt has been consuming etoh heavily today. Pts home does have a note for bed bug infestation.

## 2012-12-31 NOTE — ED Provider Notes (Signed)
History     CSN: [id number]  Arrival date & time 12/31/12  0006   First MD Initiated Contact with Patient 12/31/12 0037      Chief Complaint  Patient presents with  . Fall  . Alcohol Intoxication    (Consider location/radiation/quality/duration/timing/severity/associated sxs/prior treatment) Patient is a 52 y.o. male presenting with fall and intoxication.  Fall Incident onset: unclear time frame.  found by friends with large hematoma. Fall occurred: on the steps while intoxicated. Distance fallen: 3-4 stairs (reports falling up) There was no blood loss. The point of impact was the head. The pain is present in the head. The pain is moderate. He was ambulatory at the scene. There was no drug use involved in the accident. There was alcohol use involved in the accident. Associated symptoms include headaches. Pertinent negatives include no fever, no numbness, no abdominal pain, no nausea, no vomiting, no hearing loss and no loss of consciousness (unknown but non reported). Prehospitalization: placed in C-collar in ED. He has tried nothing for the symptoms.  Alcohol Intoxication This is a chronic (8th ED visit in 5 weeks) problem. The problem occurs constantly. Associated symptoms include headaches and neck pain. Pertinent negatives include no abdominal pain, chest pain, chills, congestion, coughing, fever, myalgias, nausea, numbness, rash, vertigo, vomiting or weakness.    Past Medical History  Diagnosis Date  . ETOH abuse   . 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 Hypertension     History reviewed. No pertinent past surgical history.  No family history on file.  History  Substance Use Topics  . Smoking status: Current Some Day Smoker -- 0.25 packs/day    Types: Cigarettes  . Smokeless tobacco: Not on file  . Alcohol Use: Yes     Comment: drinks beer and wine daily      Review of Systems  Constitutional:  Negative for fever and chills.  HENT: Positive for neck pain. Negative for congestion.   Respiratory: Negative for cough.   Cardiovascular: Negative for chest pain.  Gastrointestinal: Negative for nausea, vomiting and abdominal pain.  Genitourinary: Negative for flank pain and difficulty urinating.  Musculoskeletal: Negative for myalgias.  Skin: Negative for rash.  Neurological: Positive for headaches. Negative for vertigo, loss of consciousness (unknown but non reported), weakness and numbness.    Allergies  Review of patient's allergies indicates no known allergies.  Home Medications   Current Outpatient Rx  Name  Route  Sig  Dispense  Refill  . lisinopril (PRINIVIL,ZESTRIL) 10 MG tablet   Oral   Take 1 tablet (10 mg total) by mouth daily. For high blood pressure control   30 tablet   0   . traZODone (DESYREL) 50 MG tablet   Oral   Take 1 tablet (50 mg total) by mouth at bedtime as needed and may repeat dose one time if needed for sleep. For depression/sleep   60 tablet   0   . ibuprofen (ADVIL,MOTRIN) 600 MG tablet   Oral   Take 1 tablet (600 mg total) by mouth every 6 (six) hours as needed for pain.   30 tablet   0     BP 123/86  Pulse 92  Temp(Src) 98 F (36.7 C) (Oral)  Resp 16  SpO2 97%  Physical Exam  HENT:  Head: Normocephalic and atraumatic.  Eyes: Conjunctivae are normal. Right eye exhibits no discharge. Left eye exhibits no discharge. No  scleral icterus.  Cardiovascular: Normal rate, regular rhythm, normal heart sounds and intact distal pulses.  Exam reveals no gallop and no friction rub.   No murmur heard. Pulmonary/Chest: Effort normal and breath sounds normal. No respiratory distress. He has no wheezes. He has no rales.  Abdominal: Soft.  Musculoskeletal: Normal range of motion. He exhibits no edema.  Neurological: He is alert. He exhibits normal muscle tone.  Skin: Skin is warm and dry. No rash noted. No erythema. No pallor.  Psychiatric: He has  a normal mood and affect. His behavior is normal. Judgment and thought content normal.    ED Course  Procedures (including critical care time)  Labs Reviewed - No data to display Dg Tibia/fibula Left  12/31/2012  *RADIOLOGY REPORT*  Clinical Data: Fall, contusion.  LEFT TIBIA AND FIBULA - 2 VIEW  Comparison: Plain films 12/27/2012.  Findings: No acute bony or joint abnormality is identified. Bipartite patella is noted.  Scattered atherosclerosis is seen.  IMPRESSION: No acute finding.   Original Report Authenticated By: Holley Dexter, M.D.    Ct Head Wo Contrast  12/31/2012  *RADIOLOGY REPORT*  Clinical Data:  Status post fall.  For better region.  CT HEAD WITHOUT CONTRAST CT CERVICAL SPINE WITHOUT CONTRAST  Technique:  Multidetector CT imaging of the head and cervical spine was performed following the standard protocol without intravenous contrast.  Multiplanar CT image reconstructions of the cervical spine were also generated.  Comparison:  Headache, cervical and maxillofacial CT scan 06/26/2011.  CT HEAD  Findings: Soft tissue contusion is seen over the right frontal bone.  The brain demonstrates some cortical atrophy but no evidence of acute intracranial abnormality including infarction, hemorrhage, mass lesion, mass effect, midline shift or abnormal extra-axial fluid collection is identified.  There is an unchanged defect in the right occipital bone consistent with a large arachnoid granulation.  The calvarium is intact.  IMPRESSION: Soft tissue contusion over the frontal bone without underlying fracture or acute intracranial abnormality.  CT CERVICAL SPINE  Findings: There is no fracture or subluxation of the cervical spine.  Scattered, mild anterior endplate spurring is noted.  Lung apices are clear.  IMPRESSION: No acute finding.   Original Report Authenticated By: Holley Dexter, M.D.    Ct Cervical Spine Wo Contrast  12/31/2012  *RADIOLOGY REPORT*  Clinical Data:  Status post fall.  For  better region.  CT HEAD WITHOUT CONTRAST CT CERVICAL SPINE WITHOUT CONTRAST  Technique:  Multidetector CT imaging of the head and cervical spine was performed following the standard protocol without intravenous contrast.  Multiplanar CT image reconstructions of the cervical spine were also generated.  Comparison:  Headache, cervical and maxillofacial CT scan 06/26/2011.  CT HEAD  Findings: Soft tissue contusion is seen over the right frontal bone.  The brain demonstrates some cortical atrophy but no evidence of acute intracranial abnormality including infarction, hemorrhage, mass lesion, mass effect, midline shift or abnormal extra-axial fluid collection is identified.  There is an unchanged defect in the right occipital bone consistent with a large arachnoid granulation.  The calvarium is intact.  IMPRESSION: Soft tissue contusion over the frontal bone without underlying fracture or acute intracranial abnormality.  CT CERVICAL SPINE  Findings: There is no fracture or subluxation of the cervical spine.  Scattered, mild anterior endplate spurring is noted.  Lung apices are clear.  IMPRESSION: No acute finding.   Original Report Authenticated By: Holley Dexter, M.D.      1. Alcohol abuse with intoxication   2.  Traumatic hematoma of head, initial encounter       MDM  0100 - Large Hematoma on R frontal area, given intoxication altered so will obtain CT Head and C-Spine.  No other new focal complaint of pain is report left lower extremity pain.  no deformity on exam, pelvis stable.    0230 - CT head neck unrevealing.  C-collar removed.  Attempted to ambulate with patient but was unsteady.  Will have patient sleep off acute intoxication and plan to re-eval.  0600 - Pt sleeping comfortably.  Awakens easily.  Able to stand on own. Antalgic gait but steadily ambulating in hall.  Plan to d/c to home.      Andrena Mews, DO 12/31/12 0730

## 2012-12-31 NOTE — ED Provider Notes (Signed)
I have personally seen and examined the patient.  I have discussed the plan of care with the resident.  I have reviewed the documentation on PMH/FH/Soc. History.  I have reviewed the documentation of the resident and agree.   Joya Gaskins, MD 12/31/12 [phone]

## 2012-12-31 NOTE — ED Notes (Signed)
Pt in via EMS to triage, ETOH, requesting detox, seen at [facility] yesterday s/p fall and given c-collar at that time. Pt calm at this time.

## 2013-01-01 NOTE — ED Notes (Signed)
Assisted patient to the bathroom, patient remains slightly unsteady when ambulating

## 2013-01-01 NOTE — BH Assessment (Signed)
[facility] Assessment Progress Note   This clinician spoke briefly to patient and informed him that there are no male detox beds available at [facility] or [facility] and that there is no detox funding available from [facility] for [facility].  On-coming clinician will talk to patient about his options.

## 2013-01-01 NOTE — ED Notes (Signed)
Patient  Resting well no new changes

## 2013-01-01 NOTE — ED Provider Notes (Signed)
Pt w etoh abuse, recent evals and referrals for same. Pt presented last pm heavily intoxicated.  Current resting comfortably, easily aroused. No tremor or shakes. Normal vitals. Discussed w act team - no inpatient etoh tx beds available. Pt appears stable for d/c. Will give outpt referrals for etoh/sa programs, pt encouraged to f/u and maintain sobriety.  Pt also encouraged to f/u primary care.     Suzi Roots, MD 01/01/13 [phone]

## 2013-01-02 NOTE — ED Provider Notes (Signed)
Medical screening examination/treatment/procedure(s) were performed by non-physician practitioner and as supervising physician I was immediately available for consultation/collaboration.   Carleene Cooper III, MD 01/02/13 [phone]

## 2013-01-12 ENCOUNTER — Emergency Department (HOSPITAL_COMMUNITY): Payer: Self-pay

## 2013-01-12 ENCOUNTER — Encounter (HOSPITAL_COMMUNITY): Payer: Self-pay | Admitting: Emergency Medicine

## 2013-01-12 ENCOUNTER — Emergency Department (HOSPITAL_COMMUNITY)
Admission: EM | Admit: 2013-01-12 | Discharge: 2013-01-12 | Disposition: A | Discharge status: Home / Self Care | Payer: Self-pay | Attending: Emergency Medicine | Admitting: Emergency Medicine

## 2013-01-12 DIAGNOSIS — S82892A Other fracture of left lower leg, initial encounter for closed fracture: Secondary | ICD-10-CM

## 2013-01-12 DIAGNOSIS — S8253XA Displaced fracture of medial malleolus of unspecified tibia, initial encounter for closed fracture: Secondary | ICD-10-CM | POA: Insufficient documentation

## 2013-01-12 DIAGNOSIS — I10 Essential (primary) hypertension: Secondary | ICD-10-CM | POA: Insufficient documentation

## 2013-01-12 DIAGNOSIS — R259 Unspecified abnormal involuntary movements: Secondary | ICD-10-CM | POA: Insufficient documentation

## 2013-01-12 DIAGNOSIS — Y939 Activity, unspecified: Secondary | ICD-10-CM | POA: Insufficient documentation

## 2013-01-12 DIAGNOSIS — Z79899 Other long term (current) drug therapy: Secondary | ICD-10-CM | POA: Insufficient documentation

## 2013-01-12 DIAGNOSIS — X58XXXA Exposure to other specified factors, initial encounter: Secondary | ICD-10-CM | POA: Insufficient documentation

## 2013-01-12 DIAGNOSIS — F17200 Nicotine dependence, unspecified, uncomplicated: Secondary | ICD-10-CM | POA: Insufficient documentation

## 2013-01-12 DIAGNOSIS — Y929 Unspecified place or not applicable: Secondary | ICD-10-CM | POA: Insufficient documentation

## 2013-01-12 NOTE — ED Notes (Signed)
Per EMS, they were called today because of a possible seizure.   Patient's brother claimed patient was shaking some this morning.  Patient advised that he doesn't recall why he's here.   Patient was borderline combative per EMS.   Patient had a 20 G LAC placed by [employer].   Brother at bedside with patient.  Patient admits to drinking last night and today, but unclear as to how much he had.

## 2013-01-12 NOTE — ED Provider Notes (Signed)
Medical screening examination/treatment/procedure(s) were performed by non-physician practitioner and as supervising physician I was immediately available for consultation/collaboration.   Shayma Pfefferle, MD 01/12/13 1506 

## 2013-01-12 NOTE — ED Notes (Signed)
Paged ortho 

## 2013-01-12 NOTE — ED Notes (Signed)
Patient doesn't want to stay.  Patient keeps repeating that he is going to leave.   Brother said he was going to leave "I gotta go to work, I don't have time for this".

## 2013-01-12 NOTE — ED Provider Notes (Signed)
History     CSN: [id number]  Arrival date & time 01/12/13  1038   First MD Initiated Contact with Patient 01/12/13 1045      Chief Complaint  Patient presents with  . Alcohol Intoxication    (Consider location/radiation/quality/duration/timing/severity/associated sxs/prior treatment) HPI Todd Tran is a 52 y.o. male with a history of alcohol abuse, hypertension and smoking presents emergency department by EMS for possible seizure.  Patient's brother reports that he had an episode of "shakes" that lasted seconds, denies tongue biting, neck pain, urinary incontinence. Pt with complete work up 12 days ago including labs and CT neck/head with out acute abnormalities. At that time pt was medically cleared and was admitted to behavioral health for detox.  Patient states that he was sober for 7 days and then on discharge began drinking again. In addition patient states that he had a fall prior to his emergency department visit and has not been able to bear with heat on left extremity since the incident.  Patient reports negative the tib-fib imaging at that time.  Patient denies any numbness, tingling or weakness of extremity. patient not currently seeking detox.  Past Medical History  Diagnosis Date  . ETOH abuse   . 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 Hypertension     No past surgical history on file.  No family history on file.  History  Substance Use Topics  . Smoking status: Current Some Day Smoker -- 0.25 packs/day    Types: Cigarettes  . Smokeless tobacco: Not on file  . Alcohol Use: Yes     Comment: drinks beer and wine daily      Review of Systems Ten systems reviewed and are negative for acute change, except as noted in the HPI.   Allergies  Review of patient's allergies indicates no known allergies.  Home Medications   Current Outpatient Rx  Name  Route  Sig  Dispense  Refill  . traZODone  (DESYREL) 50 MG tablet   Oral   Take 1 tablet (50 mg total) by mouth at bedtime as needed and may repeat dose one time if needed for sleep. For depression/sleep   60 tablet   0   . lisinopril (PRINIVIL,ZESTRIL) 10 MG tablet   Oral   Take 1 tablet (10 mg total) by mouth daily. For high blood pressure control   30 tablet   0     BP 100/84  Pulse 96  Temp(Src) 98.3 F (36.8 C) (Oral)  Resp 22  Ht 5\' 11"  (1.803 m)  Wt 170 lb (77.111 kg)  BMI 23.72 kg/m2  SpO2 96%  Physical Exam  Nursing note and vitals reviewed. Constitutional: He appears well-developed and well-nourished. No distress.  Post ictal, mildly altered, Hypertensive   HENT:  Head: Normocephalic.  Atraumatic, No evidence of tongue oral lacerations  Eyes: Pupils are equal, round, and reactive to light.  Neck: Normal range of motion. Neck supple.  Cervical spinous process non tender without step offs, no difficulty or pain with flexion or extension of neck  Cardiovascular: Normal rate, regular rhythm, normal heart sounds and intact distal pulses.   Pulmonary/Chest: Breath sounds normal. No respiratory distress. He has no wheezes. He has no rales.  Abdominal: Soft. There is no tenderness.  Musculoskeletal: He exhibits no edema and no tenderness.  TTP over left medical malleolus. Full active & passive ROM of arms bilaterally  Neurological:  CN III-VII intact. Iintact coordination, sensation, and motor (finger grip, biceps, hamstrings & dorsiflexion). No pass pointing, good rapid coordination.    Skin: Skin is warm and dry. He is not diaphoretic.  intact    ED Course  Procedures (including critical care time)  Labs Reviewed - No data to display Dg Tibia/fibula Left  01/12/2013   *RADIOLOGY REPORT*  Clinical Data: Larey Seat and injured left lower leg.  LEFT TIBIA AND FIBULA - 2 VIEW  Comparison: Left ankle x-rays obtained concurrently.  Left tibia- fibula x-rays 12/31/2012, 12/27/2012.  Findings: Tiny avulsion fracture  arising from the medial malleolus identified on the ankle images is not visualized on these images. No acute fractures involving the tibia or fibula.  Bipartite patella.  Prominent tibial tubercle.  Knee joint intact.  Well- preserved bone mineral density.  IMPRESSION: No acute fractures involving the tibia or fibula.  Avulsion fracture arising from the medial malleolus identified on the concurrent ankle images is not visualized on these images.   Original Report Authenticated By: Hulan Saas, M.D.   Dg Ankle Complete Left  01/12/2013   *RADIOLOGY REPORT*  Clinical Data: Larey Seat and injured left ankle.  LEFT ANKLE COMPLETE - 3+ VIEW  Comparison: Left tibia-fibula x-rays 12/31/2012, 12/27/2012.  Findings: Tiny avulsion fracture arising from the medial malleolus. No other fractures.  Ankle mortise intact with well-preserved joint space.  Well-preserved bone mineral density.  Very small plantar calcaneal spur.  Calcification involving the posterior tibial artery.  IMPRESSION: Tiny avulsion fracture arising from the medial malleolus.  Very small plantar calcaneal spur.   Original Report Authenticated By: Hulan Saas, M.D.     No diagnosis found.    MDM  Medial malleolus fracture  The patient to ER with painful weightbearing of left mA.  Neurovascularly intact on exam.  Imaging reviewed showing tiny avulsion fracture of the medial malleolus.  No displacement.  Patient discharged with crutches and postop boot.  Pain medication not given due to patient's significant alcohol abuse.  Conservative home therapy discussed.        Jaci Carrel, [place] 01/12/13 1243

## 2013-01-12 NOTE — ED Notes (Signed)
Pt returned from radiology.

## 2013-01-19 ENCOUNTER — Encounter (HOSPITAL_COMMUNITY): Payer: Self-pay | Admitting: Emergency Medicine

## 2013-01-19 ENCOUNTER — Emergency Department (HOSPITAL_COMMUNITY)
Admission: EM | Admit: 2013-01-19 | Discharge: 2013-01-20 | Disposition: A | Discharge status: Home / Self Care | Payer: Self-pay | Attending: Emergency Medicine | Admitting: Emergency Medicine

## 2013-01-19 DIAGNOSIS — F10120 Alcohol abuse with intoxication, uncomplicated: Secondary | ICD-10-CM

## 2013-01-19 DIAGNOSIS — M6281 Muscle weakness (generalized): Secondary | ICD-10-CM | POA: Insufficient documentation

## 2013-01-19 DIAGNOSIS — G5620 Lesion of ulnar nerve, unspecified upper limb: Secondary | ICD-10-CM | POA: Insufficient documentation

## 2013-01-19 DIAGNOSIS — F1010 Alcohol abuse, uncomplicated: Secondary | ICD-10-CM | POA: Insufficient documentation

## 2013-01-19 DIAGNOSIS — G5621 Lesion of ulnar nerve, right upper limb: Secondary | ICD-10-CM

## 2013-01-19 DIAGNOSIS — G5630 Lesion of radial nerve, unspecified upper limb: Secondary | ICD-10-CM | POA: Diagnosis present

## 2013-01-19 DIAGNOSIS — G5631 Lesion of radial nerve, right upper limb: Secondary | ICD-10-CM

## 2013-01-19 DIAGNOSIS — I10 Essential (primary) hypertension: Secondary | ICD-10-CM | POA: Insufficient documentation

## 2013-01-19 DIAGNOSIS — F1020 Alcohol dependence, uncomplicated: Secondary | ICD-10-CM

## 2013-01-19 DIAGNOSIS — Z79899 Other long term (current) drug therapy: Secondary | ICD-10-CM | POA: Insufficient documentation

## 2013-01-19 DIAGNOSIS — F17200 Nicotine dependence, unspecified, uncomplicated: Secondary | ICD-10-CM | POA: Insufficient documentation

## 2013-01-19 NOTE — ED Notes (Signed)
Per EMS: pt c/o right arm weakness since last night; EMS did stroke screen and was negative; pt able to raise arm with weak grip; pt actively intoxicated; EMS called out for stroke initially; pt had pseudo-seizure per EMS; pt had no LOC; pt residence has bed bugs; VSS CBG 106

## 2013-01-20 ENCOUNTER — Emergency Department (HOSPITAL_COMMUNITY): Payer: Self-pay

## 2013-01-20 DIAGNOSIS — F1020 Alcohol dependence, uncomplicated: Secondary | ICD-10-CM

## 2013-01-20 DIAGNOSIS — F10229 Alcohol dependence with intoxication, unspecified: Secondary | ICD-10-CM

## 2013-01-20 DIAGNOSIS — G5630 Lesion of radial nerve, unspecified upper limb: Secondary | ICD-10-CM

## 2013-01-20 LAB — COMPREHENSIVE METABOLIC PANEL
ALT: 57 U/L — ABNORMAL HIGH (ref 0–53)
AST: 130 U/L — ABNORMAL HIGH (ref 0–37)
Albumin: 3.9 g/dL (ref 3.5–5.2)
Alkaline Phosphatase: 110 U/L (ref 39–117)
BUN: 6 mg/dL (ref 6–23)
Chloride: 97 mEq/L (ref 96–112)
Potassium: 3.1 mEq/L — ABNORMAL LOW (ref 3.5–5.1)
Sodium: 137 mEq/L (ref 135–145)
Total Bilirubin: 0.9 mg/dL (ref 0.3–1.2)
Total Protein: 8.3 g/dL (ref 6.0–8.3)

## 2013-01-20 LAB — RAPID URINE DRUG SCREEN, HOSP PERFORMED
Amphetamines: NOT DETECTED
Barbiturates: NOT DETECTED
Benzodiazepines: NOT DETECTED
Cocaine: NOT DETECTED
Opiates: NOT DETECTED
Tetrahydrocannabinol: NOT DETECTED

## 2013-01-20 LAB — ETHANOL: Alcohol, Ethyl (B): 394 mg/dL — ABNORMAL HIGH (ref 0–11)

## 2013-01-20 LAB — CBC
HCT: 39.6 % (ref 39.0–52.0)
Hemoglobin: 14 g/dL (ref 13.0–17.0)
RDW: 14.7 % (ref 11.5–15.5)
WBC: 5 10*3/uL (ref 4.0–10.5)

## 2013-01-20 NOTE — ED Provider Notes (Signed)
History     CSN: [id number]  Arrival date & time 01/19/13  2211   First MD Initiated Contact with Patient 01/19/13 2348      Chief Complaint  Patient presents with  . Alcohol Intoxication  . Extremity Weakness    (Consider location/radiation/quality/duration/timing/severity/associated sxs/prior treatment) Patient is a 52 y.o. male presenting with intoxication and extremity weakness. The history is provided by the patient.  Alcohol Intoxication This is a recurrent problem. The current episode started yesterday. The problem occurs constantly. The problem has not changed since onset.Pertinent negatives include no chest pain, no abdominal pain, no headaches and no shortness of breath. Nothing aggravates the symptoms. Nothing relieves the symptoms. He has tried nothing for the symptoms. The treatment provided no relief.  Extremity Weakness This is a new problem. The current episode started 12 to 24 hours ago. The problem occurs constantly. The problem has not changed since onset.Pertinent negatives include no chest pain, no abdominal pain, no headaches and no shortness of breath. Nothing aggravates the symptoms. Nothing relieves the symptoms. He has tried nothing for the symptoms. The treatment provided no relief.    Past Medical History  Diagnosis Date  . ETOH abuse   . 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 Hypertension     History reviewed. No pertinent past surgical history.  History reviewed. No pertinent family history.  History  Substance Use Topics  . Smoking status: Current Some Day Smoker -- 0.25 packs/day    Types: Cigarettes  . Smokeless tobacco: Not on file  . Alcohol Use: Yes     Comment: drinks beer and wine daily      Review of Systems  Respiratory: Negative for shortness of breath.   Cardiovascular: Negative for chest pain.  Gastrointestinal: Negative for abdominal pain.  Musculoskeletal:  Positive for extremity weakness.  Neurological: Positive for weakness. Negative for facial asymmetry, speech difficulty and headaches.  All other systems reviewed and are negative.    Allergies  Review of patient's allergies indicates no known allergies.  Home Medications   Current Outpatient Rx  Name  Route  Sig  Dispense  Refill  . lisinopril (PRINIVIL,ZESTRIL) 10 MG tablet   Oral   Take 1 tablet (10 mg total) by mouth daily. For high blood pressure control   30 tablet   0   . traZODone (DESYREL) 50 MG tablet   Oral   Take 1 tablet (50 mg total) by mouth at bedtime as needed and may repeat dose one time if needed for sleep. For depression/sleep   60 tablet   0     BP 133/80  Pulse 81  Temp(Src) 97.2 F (36.2 C) (Oral)  Resp 19  SpO2 98%  Physical Exam  Constitutional: He appears well-developed and well-nourished. No distress.  HENT:  Head: Normocephalic and atraumatic.  Mouth/Throat: Oropharynx is clear and moist.  Eyes: Conjunctivae are normal. Pupils are equal, round, and reactive to light.  Neck: Normal range of motion. Neck supple.  Cardiovascular: Normal rate, regular rhythm and intact distal pulses.   Pulmonary/Chest: Effort normal and breath sounds normal. He has no wheezes. He has no rales.  Abdominal: Soft. Bowel sounds are normal. There is no tenderness. There is no rebound and no guarding.  Musculoskeletal: Normal range of motion.  Neurological: He is alert. He has normal reflexes. No cranial nerve deficit.  RUE weakness 5-/5  Skin: Skin is warm  and dry.  Psychiatric: He has a normal mood and affect.    ED Course  Procedures (including critical care time)  Labs Reviewed  CBC  COMPREHENSIVE METABOLIC PANEL  ETHANOL  ACETAMINOPHEN LEVEL  SALICYLATE LEVEL  URINE RAPID DRUG SCREEN (HOSP PERFORMED)   No results found.   No diagnosis found.    MDM   Date: 01/20/2013  Rate: 75  Rhythm: normal sinus rhythm  QRS Axis: normal  Intervals:  normal  ST/T Wave abnormalities: normal  Conduction Disutrbances: none  Narrative Interpretation: unremarkable          Seen by Dr. Roseanne Reno has isolated wrist drop, ulnar palsy place in wrist splint and follow up outpatient with neurology   Mendy Lapinsky K Minnie Shi-Rasch, MD 01/20/13 [id number]

## 2013-01-20 NOTE — Consult Note (Signed)
NEURO HOSPITALIST CONSULT NOTE    Reason for Consult: Acute weakness of right hand.  HPI:                                                                                                                                          Todd Tran is an 52 y.o. male history of hypertension and alcohol abuse presenting with new onset weakness of right hand. Patient told to on the morning of 01/19/2013 unable to grip with his right hand. He had no weakness proximally of his right arm and no right lower extremity weakness. No facial weakness no speech changes were noted. Is not aware of any injury involving his right upper extremity. Alcohol level in the emergency room was 394. CT scan of his head showed no acute intracranial abnormality.  Past Medical History  Diagnosis Date  . ETOH abuse   . 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 Hypertension     History reviewed. No pertinent past surgical history.  History reviewed. No pertinent family history.   Social History:  reports that he has been smoking Cigarettes.  He has been smoking about 0.25 packs per day. He does not have any smokeless tobacco history on file. He reports that  drinks alcohol. He reports that he does not use illicit drugs.  No Known Allergies  MEDICATIONS:                                                                                                                     Prior to Admission:  Fosinopril 10 mg per day Trazodone 50 mg each bedtime    Blood pressure 123/78, pulse 74, temperature 97.2 F (36.2 C), temperature source Oral, resp. rate 19, SpO2 94.00%.   Neurologic Examination:  Mental Status: Intoxicated and lethargic.  Speech moderately slurred without evidence of aphasia. Able to follow commands. Cranial Nerves: II-Visual  fields were normal. III/IV/VI-Pupils were equal and reacted. Extraocular movements were full with alternating exotropia .    V/VII-no facial weakness. VIII-normal. X- speech moderately slurred, consistent with the extent of intoxication . Motor:  0/5 strength of right wrist extensors and extensors of fingers; normal wrist flexion on the right, as well as finger flexion strength. Brachioradialis and triceps on the right were normal. Motor exam is otherwise unremarkable.  Sensory:  numbness and superficial radial nerve distribution on the right. Deep Tendon Reflexes:  trace only in the upper extremities and absent in lower extremities . Plantars:  not tested Cerebellar: Normal finger-to-nose testing with left upper extremity.  Lab Results  Component Value Date/Time   CHOL 202* 06/28/2009 12:19 AM    Results for orders placed during the hospital encounter of 01/19/13 (from the past 48 hour(s))  CBC     Status: Abnormal   Collection Time    01/19/13 11:20 PM      Result Value Range   WBC 5.0  4.0 - 10.5 K/uL   RBC 4.40  4.22 - 5.81 MIL/uL   Hemoglobin 14.0  13.0 - 17.0 g/dL   HCT 16.1  09.6 - 04.5 %   MCV 90.0  78.0 - 100.0 fL   MCH 31.8  26.0 - 34.0 pg   MCHC 35.4  30.0 - 36.0 g/dL   RDW 40.9  81.1 - 91.4 %   Platelets 97 (*) 150 - 400 K/uL   Comment: SPECIMEN CHECKED FOR CLOTS  COMPREHENSIVE METABOLIC PANEL     Status: Abnormal   Collection Time    01/19/13 11:20 PM      Result Value Range   Sodium 137  135 - 145 mEq/L   Potassium 3.1 (*) 3.5 - 5.1 mEq/L   Chloride 97  96 - 112 mEq/L   CO2 25  19 - 32 mEq/L   Glucose, Bld 81  70 - 99 mg/dL   BUN 6  6 - 23 mg/dL   Creatinine, Ser 7.82  0.50 - 1.35 mg/dL   Calcium 9.7  8.4 - 95.6 mg/dL   Total Protein 8.3  6.0 - 8.3 g/dL   Albumin 3.9  3.5 - 5.2 g/dL   AST 213 (*) 0 - 37 U/L   ALT 57 (*) 0 - 53 U/L   Alkaline Phosphatase 110  39 - 117 U/L   Total Bilirubin 0.9  0.3 - 1.2 mg/dL   GFR calc non Af Amer >90  >90 mL/min   GFR  calc Af Amer >90  >90 mL/min   Comment:            The eGFR has been calculated     using the CKD EPI equation.     This calculation has not been     validated in all clinical     situations.     eGFR's persistently     <90 mL/min signify     possible Chronic Kidney Disease.  ETHANOL     Status: Abnormal   Collection Time    01/19/13 11:20 PM      Result Value Range   Alcohol, Ethyl (B) 394 (*) 0 - 11 mg/dL   Comment:            LOWEST DETECTABLE LIMIT FOR     SERUM ALCOHOL IS 11 mg/dL     FOR MEDICAL  PURPOSES ONLY  ACETAMINOPHEN LEVEL     Status: None   Collection Time    01/19/13 11:20 PM      Result Value Range   Acetaminophen (Tylenol), Serum <15.0  10 - 30 ug/mL   Comment:            THERAPEUTIC CONCENTRATIONS VARY     SIGNIFICANTLY. A RANGE OF 10-30     ug/mL MAY BE AN EFFECTIVE     CONCENTRATION FOR MANY PATIENTS.     HOWEVER, SOME ARE BEST TREATED     AT CONCENTRATIONS OUTSIDE THIS     RANGE.     ACETAMINOPHEN CONCENTRATIONS     >150 ug/mL AT 4 HOURS AFTER     INGESTION AND >50 ug/mL AT 12     HOURS AFTER INGESTION ARE     OFTEN ASSOCIATED WITH TOXIC     REACTIONS.  SALICYLATE LEVEL     Status: Abnormal   Collection Time    01/19/13 11:20 PM      Result Value Range   Salicylate Lvl <2.0 (*) 2.8 - 20.0 mg/dL  URINE RAPID DRUG SCREEN (HOSP PERFORMED)     Status: None   Collection Time    01/19/13 11:49 PM      Result Value Range   Opiates NONE DETECTED  NONE DETECTED   Cocaine NONE DETECTED  NONE DETECTED   Benzodiazepines NONE DETECTED  NONE DETECTED   Amphetamines NONE DETECTED  NONE DETECTED   Tetrahydrocannabinol NONE DETECTED  NONE DETECTED   Barbiturates NONE DETECTED  NONE DETECTED   Comment:            DRUG SCREEN FOR MEDICAL PURPOSES     ONLY.  IF CONFIRMATION IS NEEDED     FOR ANY PURPOSE, NOTIFY LAB     WITHIN 5 DAYS.                LOWEST DETECTABLE LIMITS     FOR URINE DRUG SCREEN     Drug Class       Cutoff (ng/mL)     Amphetamine       1000     Barbiturate      200     Benzodiazepine   200     Tricyclics       300     Opiates          300     Cocaine          300     THC              50    Ct Head Wo Contrast  01/20/2013   *RADIOLOGY REPORT*  Clinical Data: Right arm weakness.  CT HEAD WITHOUT CONTRAST  Technique:  Contiguous axial images were obtained from the base of the skull through the vertex without contrast.  Comparison: CT of the head performed 12/31/2012  Findings: There is no evidence of acute infarction, mass lesion, or intra- or extra-axial hemorrhage on CT.  Prominence of the ventricles and sulci reflects mild cortical volume loss.  Mild cerebellar atrophy is noted.  The brainstem and fourth ventricle are within normal limits.  The basal ganglia are unremarkable in appearance.  The cerebral hemispheres demonstrate grossly normal gray-white differentiation. No mass effect or midline shift is seen.  There is no evidence of fracture; visualized osseous structures are unremarkable in appearance.  The visualized portions of the orbits are within normal limits.  The paranasal sinuses and mastoid air cells  are well-aerated.  Mild soft tissue swelling is noted at the left occiput.  IMPRESSION:  1.  No acute intracranial pathology seen on CT. 2.  Mild cortical volume loss noted.   Original Report Authenticated By: Tonia Ghent, M.D.     Assessment/Plan: 1. Acute right radial nerve palsy, most likely secondary to compression type injury. 2. Alcohol intoxication  Recommendations: 1. Cockup splint for right wrist 2. Physical therapy and occupational therapy referrals 3. Outpatient neurology referral  Venetia Maxon M.D. [facility] [phone]   01/20/2013, 2:02 AM

## 2013-01-20 NOTE — Progress Notes (Signed)
Orthopedic Tech Progress Note Patient Details:  Todd Tran 1961/02/22 [id number]  Ortho Devices Type of Ortho Device: Volar splint   Haskell Flirt 01/20/2013, 2:44 AM

## 2013-01-27 ENCOUNTER — Encounter (HOSPITAL_COMMUNITY): Payer: Self-pay | Admitting: Emergency Medicine

## 2013-01-27 ENCOUNTER — Emergency Department (HOSPITAL_COMMUNITY)
Admission: EM | Admit: 2013-01-27 | Discharge: 2013-01-27 | Disposition: A | Discharge status: Home / Self Care | Payer: Self-pay | Attending: Emergency Medicine | Admitting: Emergency Medicine

## 2013-01-27 ENCOUNTER — Emergency Department (HOSPITAL_COMMUNITY): Payer: Self-pay

## 2013-01-27 DIAGNOSIS — W19XXXA Unspecified fall, initial encounter: Secondary | ICD-10-CM | POA: Insufficient documentation

## 2013-01-27 DIAGNOSIS — Y939 Activity, unspecified: Secondary | ICD-10-CM | POA: Insufficient documentation

## 2013-01-27 DIAGNOSIS — Z87720 Personal history of (corrected) congenital malformations of eye: Secondary | ICD-10-CM | POA: Insufficient documentation

## 2013-01-27 DIAGNOSIS — I10 Essential (primary) hypertension: Secondary | ICD-10-CM | POA: Insufficient documentation

## 2013-01-27 DIAGNOSIS — F1010 Alcohol abuse, uncomplicated: Secondary | ICD-10-CM | POA: Insufficient documentation

## 2013-01-27 DIAGNOSIS — F10220 Alcohol dependence with intoxication, uncomplicated: Secondary | ICD-10-CM

## 2013-01-27 DIAGNOSIS — Y929 Unspecified place or not applicable: Secondary | ICD-10-CM | POA: Insufficient documentation

## 2013-01-27 DIAGNOSIS — Z79899 Other long term (current) drug therapy: Secondary | ICD-10-CM | POA: Insufficient documentation

## 2013-01-27 DIAGNOSIS — F17200 Nicotine dependence, unspecified, uncomplicated: Secondary | ICD-10-CM | POA: Insufficient documentation

## 2013-01-27 DIAGNOSIS — S8000XA Contusion of unspecified knee, initial encounter: Secondary | ICD-10-CM | POA: Insufficient documentation

## 2013-01-27 DIAGNOSIS — S8002XA Contusion of left knee, initial encounter: Secondary | ICD-10-CM

## 2013-01-27 LAB — CBC WITH DIFFERENTIAL/PLATELET
Basophils Absolute: 0 10*3/uL (ref 0.0–0.1)
Basophils Relative: 1 % (ref 0–1)
Eosinophils Absolute: 0.1 10*3/uL (ref 0.0–0.7)
MCH: 31.2 pg (ref 26.0–34.0)
MCHC: 34.3 g/dL (ref 30.0–36.0)
Monocytes Relative: 7 % (ref 3–12)
Neutro Abs: 1.8 10*3/uL (ref 1.7–7.7)
Neutrophils Relative %: 49 % (ref 43–77)
Platelets: 106 10*3/uL — ABNORMAL LOW (ref 150–400)
RDW: 15.5 % (ref 11.5–15.5)

## 2013-01-27 LAB — COMPREHENSIVE METABOLIC PANEL
AST: 204 U/L — ABNORMAL HIGH (ref 0–37)
Albumin: 3.5 g/dL (ref 3.5–5.2)
Alkaline Phosphatase: 118 U/L — ABNORMAL HIGH (ref 39–117)
BUN: 3 mg/dL — ABNORMAL LOW (ref 6–23)
Chloride: 102 mEq/L (ref 96–112)
Potassium: 3.3 mEq/L — ABNORMAL LOW (ref 3.5–5.1)
Total Bilirubin: 0.4 mg/dL (ref 0.3–1.2)
Total Protein: 8.1 g/dL (ref 6.0–8.3)

## 2013-01-27 LAB — RAPID URINE DRUG SCREEN, HOSP PERFORMED
Amphetamines: NOT DETECTED
Benzodiazepines: NOT DETECTED
Opiates: NOT DETECTED
Tetrahydrocannabinol: NOT DETECTED

## 2013-01-27 NOTE — ED Provider Notes (Signed)
Pt is eating a meal.  He is alert and awake.  Ready for discharge.  Celene Kras, MD 01/27/13 [phone]

## 2013-01-27 NOTE — ED Notes (Signed)
Pt from home c/o fall, pt was walking, lost balance and fell. C/o left knee pain.

## 2013-01-27 NOTE — ED Provider Notes (Signed)
History     CSN: [id number]  Arrival date & time 01/27/13  4098   First MD Initiated Contact with Patient 01/27/13 [phone]      Chief Complaint  Patient presents with  . Fall    (Consider location/radiation/quality/duration/timing/severity/associated sxs/prior treatment) HPI Pt with multiple previous visits to the ED for alcohol related complaints presents after falling from standing this AM. Pt states he cna not remember the details of the fall. No head or neck injury. States he drank last night but did not drink this AM. Pt has chronic L knee pain from previous injury. He states the pain is worse after fall and also c/o of L ankle pain. No swelling or obvious injury.  Past Medical History  Diagnosis Date  . ETOH abuse   . 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 Hypertension     History reviewed. No pertinent past surgical history.  History reviewed. No pertinent family history.  History  Substance Use Topics  . Smoking status: Current Some Day Smoker -- 0.25 packs/day    Types: Cigarettes  . Smokeless tobacco: Not on file  . Alcohol Use: Yes     Comment: drinks beer and wine daily      Review of Systems  Constitutional: Negative for fever and chills.  HENT: Negative for neck pain.   Respiratory: Negative for shortness of breath.   Cardiovascular: Negative for chest pain.  Gastrointestinal: Negative for nausea, vomiting and abdominal pain.  Musculoskeletal: Positive for myalgias and arthralgias. Negative for joint swelling.  Skin: Positive for wound. Negative for rash.  Neurological: Negative for dizziness, syncope, weakness, light-headedness, numbness and headaches.  All other systems reviewed and are negative.    Allergies  Review of patient's allergies indicates no known allergies.  Home Medications   Current Outpatient Rx  Name  Route  Sig  Dispense  Refill  . lisinopril (PRINIVIL,ZESTRIL) 10  MG tablet   Oral   Take 1 tablet (10 mg total) by mouth daily. For high blood pressure control   30 tablet   0   . traZODone (DESYREL) 50 MG tablet   Oral   Take 1 tablet (50 mg total) by mouth at bedtime as needed and may repeat dose one time if needed for sleep. For depression/sleep   60 tablet   0     BP 119/86  Pulse 78  Temp(Src) 98.3 F (36.8 C)  Resp 15  SpO2 100%  Physical Exam  Nursing note and vitals reviewed. Constitutional: He is oriented to person, place, and time. He appears well-developed and well-nourished. No distress.  HENT:  Head: Normocephalic and atraumatic.  Mouth/Throat: Oropharynx is clear and moist.  Eyes: Pupils are equal, round, and reactive to light.  Muddy sclera, disconjugate gaze (chronic finding by prev exams)  Neck: Normal range of motion. Neck supple.  No posterior cervical tenderness  Cardiovascular: Normal rate and regular rhythm.   Pulmonary/Chest: Effort normal and breath sounds normal. No respiratory distress. He has no wheezes. He has no rales.  Abdominal: Soft. Bowel sounds are normal. He exhibits no distension and no mass. There is no tenderness. There is no rebound and no guarding.  Musculoskeletal: Normal range of motion. He exhibits tenderness (TTP over L knee and ankle. Pt unwilling to range joints. No swelling or deformity. 2+ DP. ). He exhibits no edema.  Neurological: He is alert and oriented to person, place,  and time.  Sensation grossly intact. No focal deficit though incomplete eval due to pt non-compliance.   Skin: Skin is warm and dry. No rash noted. No erythema.  Psychiatric: He has a normal mood and affect. His behavior is normal.    ED Course  Procedures (including critical care time)  Labs Reviewed  CBC WITH DIFFERENTIAL - Abnormal; Notable for the following:    WBC 3.7 (*)    Platelets 106 (*)    All other components within normal limits  COMPREHENSIVE METABOLIC PANEL - Abnormal; Notable for the following:     Potassium 3.3 (*)    BUN 3 (*)    AST 204 (*)    ALT 89 (*)    Alkaline Phosphatase 118 (*)    All other components within normal limits  ETHANOL - Abnormal; Notable for the following:    Alcohol, Ethyl (B) 466 (*)    All other components within normal limits  URINE RAPID DRUG SCREEN (HOSP PERFORMED)   Dg Knee 2 Views Left  01/27/2013   *RADIOLOGY REPORT*  Clinical Data: Fall  LEFT KNEE - 1-2 VIEW  Comparison: 12/31/2012  Findings: Two views of the left knee submitted.  No acute fracture or subluxation.  Bipartite patella again noted.  Small joint effusion.  IMPRESSION: No acute fracture or subluxation.  Small joint effusion.   Original Report Authenticated By: Natasha Mead, M.D.   Dg Ankle 2 Views Left  01/27/2013   *RADIOLOGY REPORT*  Clinical Data: Injury to left ankle.  Medial pain and swelling.  LEFT ANKLE - 2 VIEW  Comparison: 01/12/2013  Findings: Previously noted small avulsion fracture of the medial malleolus is not definitively identified on the two-view study. There is no evidence of visible acute fracture or dislocation. There is some chronic sclerosis and irregularity of the medial malleolus.  IMPRESSION: No acute fracture is identified.  The recently noted small avulsion fracture of the medial malleolus is not visible on the two-view study.   Original Report Authenticated By: Irish Lack, M.D.     1. Acute alcohol intoxication in patient with alcoholism with blood alcohol level over 0.3, uncomplicated   2. Knee contusion, left, initial encounter       MDM  No acute injuries found. Will allow to sober in ED and d/c home. Turned over to oncoming EDP        Loren Racer, MD 01/28/13 [phone]

## 2013-01-27 NOTE — ED Notes (Signed)
Patient transported to X-ray 

## 2013-01-28 LAB — ETHANOL: Alcohol, Ethyl (B): 466 mg/dL (ref 0–11)

## 2013-02-09 ENCOUNTER — Emergency Department (HOSPITAL_COMMUNITY): Payer: Self-pay

## 2013-02-09 ENCOUNTER — Emergency Department (HOSPITAL_COMMUNITY)
Admission: EM | Admit: 2013-02-09 | Discharge: 2013-02-09 | Disposition: A | Discharge status: Home / Self Care | Payer: Self-pay | Attending: Emergency Medicine | Admitting: Emergency Medicine

## 2013-02-09 ENCOUNTER — Encounter (HOSPITAL_COMMUNITY): Payer: Self-pay | Admitting: Emergency Medicine

## 2013-02-09 DIAGNOSIS — I10 Essential (primary) hypertension: Secondary | ICD-10-CM | POA: Insufficient documentation

## 2013-02-09 DIAGNOSIS — Z87720 Personal history of (corrected) congenital malformations of eye: Secondary | ICD-10-CM | POA: Insufficient documentation

## 2013-02-09 DIAGNOSIS — Z23 Encounter for immunization: Secondary | ICD-10-CM | POA: Insufficient documentation

## 2013-02-09 DIAGNOSIS — F10120 Alcohol abuse with intoxication, uncomplicated: Secondary | ICD-10-CM

## 2013-02-09 DIAGNOSIS — R55 Syncope and collapse: Secondary | ICD-10-CM | POA: Insufficient documentation

## 2013-02-09 DIAGNOSIS — F1010 Alcohol abuse, uncomplicated: Secondary | ICD-10-CM | POA: Insufficient documentation

## 2013-02-09 DIAGNOSIS — R5381 Other malaise: Secondary | ICD-10-CM | POA: Insufficient documentation

## 2013-02-09 DIAGNOSIS — F17200 Nicotine dependence, unspecified, uncomplicated: Secondary | ICD-10-CM | POA: Insufficient documentation

## 2013-02-09 LAB — POCT I-STAT, CHEM 8
Creatinine, Ser: 1.4 mg/dL — ABNORMAL HIGH (ref 0.50–1.35)
Glucose, Bld: 94 mg/dL (ref 70–99)
Hemoglobin: 14.6 g/dL (ref 13.0–17.0)
TCO2: 27 mmol/L (ref 0–100)

## 2013-02-09 LAB — CBC WITH DIFFERENTIAL/PLATELET
Basophils Absolute: 0 10*3/uL (ref 0.0–0.1)
Eosinophils Absolute: 0.1 10*3/uL (ref 0.0–0.7)
HCT: 37.7 % — ABNORMAL LOW (ref 39.0–52.0)
Lymphs Abs: 1.7 10*3/uL (ref 0.7–4.0)
MCHC: 34.2 g/dL (ref 30.0–36.0)
MCV: 91.3 fL (ref 78.0–100.0)
Neutro Abs: 2.4 10*3/uL (ref 1.7–7.7)
Platelets: 98 10*3/uL — ABNORMAL LOW (ref 150–400)
RDW: 15.7 % — ABNORMAL HIGH (ref 11.5–15.5)

## 2013-02-09 LAB — ETHANOL: Alcohol, Ethyl (B): 424 mg/dL (ref 0–11)

## 2013-02-09 MED ORDER — SODIUM CHLORIDE 0.9 % IV SOLN: 1000.0000 mL | INTRAVENOUS | Status: DC

## 2013-02-09 MED ORDER — SODIUM CHLORIDE 0.9 % IV SOLN
1000.0000 mL | Freq: Once | INTRAVENOUS | Status: AC
  Administered 2013-02-09: 1000 mL via INTRAVENOUS

## 2013-02-09 MED ORDER — TETANUS-DIPHTH-ACELL PERTUSSIS 5-2.5-18.5 LF-MCG/0.5 IM SUSP
0.5000 mL | Freq: Once | INTRAMUSCULAR | Status: AC
  Administered 2013-02-09: 0.5 mL via INTRAMUSCULAR
  Filled 2013-02-09 (×2): qty 0.5

## 2013-02-09 MED ORDER — THIAMINE HCL 100 MG/ML IJ SOLN
100.0000 mg | Freq: Once | INTRAMUSCULAR | Status: AC
  Administered 2013-02-09: 100 mg via INTRAVENOUS
  Filled 2013-02-09: qty 2

## 2013-02-09 NOTE — ED Notes (Signed)
Patient arousable to voice, but then passes out asleep again.

## 2013-02-09 NOTE — ED Notes (Signed)
Pt able to ambulate without assistance to BR

## 2013-02-09 NOTE — ED Provider Notes (Signed)
History    CSN: [id number] Arrival date & time 02/09/13  1356 First MD Initiated Contact with Patient 02/09/13 1429      Chief Complaint  Patient presents with  . Alcohol Intoxication  . Weakness  . Medical Clearance    HPI Patient presents to the emergency room with complaints of syncope. The patient was at home today. He has been drinking alcohol. The patient apparently was walking around the outside of his house. He got lightheaded and fell to the ground striking his head. Patient denies any chest pain or shortness of breath.  Patient does feel like he is having some generalized weakness. He does have a history of alcohol abuse and frequent visits to the emergency room for alcohol intoxication. He has not had fevers. He has not noticed any blood in his stool.  Past Medical History  Diagnosis Date  . ETOH abuse   . 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 Hypertension     History reviewed. No pertinent past surgical history.  No family history on file.  History  Substance Use Topics  . Smoking status: Current Some Day Smoker -- 0.25 packs/day    Types: Cigarettes  . Smokeless tobacco: Not on file  . Alcohol Use: Yes     Comment: drinks beer and wine daily      Review of Systems  All other systems reviewed and are negative.    Allergies  Review of patient's allergies indicates no known allergies.  Home Medications   Current Outpatient Rx  Name  Route  Sig  Dispense  Refill  . lisinopril (PRINIVIL,ZESTRIL) 10 MG tablet   Oral   Take 1 tablet (10 mg total) by mouth daily. For high blood pressure control   30 tablet   0   . traZODone (DESYREL) 50 MG tablet   Oral   Take 1 tablet (50 mg total) by mouth at bedtime as needed and may repeat dose one time if needed for sleep. For depression/sleep   60 tablet   0     BP 117/74  Pulse 113  Temp(Src) 98.3 F (36.8 C) (Oral)  Resp 20  SpO2  97%  Physical Exam  Nursing note and vitals reviewed. Constitutional: He appears well-developed and well-nourished. No distress.  HENT:  Head: Normocephalic.  Right Ear: External ear normal.  Left Ear: External ear normal.  Abrasion to the top of his forehead  Eyes: Conjunctivae are normal. Right eye exhibits no discharge. Left eye exhibits no discharge. No scleral icterus.  Right eye extraocular movements are abnormal  Neck: Neck supple. No tracheal deviation present.  Cardiovascular: Normal rate, regular rhythm and intact distal pulses.   Pulmonary/Chest: Effort normal and breath sounds normal. No stridor. No respiratory distress. He has no wheezes. He has no rales.  Abdominal: Soft. Bowel sounds are normal. He exhibits no distension. There is no tenderness. There is no rebound and no guarding.  Musculoskeletal: He exhibits no edema and no tenderness.  Entire spine nontender, no deformities noted in all 4 extremities full range of motion all 4 extremities  Neurological: He is alert. He has normal strength. No sensory deficit. Cranial nerve deficit:  no gross defecits noted. He exhibits normal muscle tone. He displays no seizure activity. Coordination normal.  Able to move all 4 extremities, no focal weakness, no facial droop,   Skin: Skin is warm and dry. No rash noted.  Psychiatric: He has a normal mood and affect.    ED Course  Procedures (including critical care time)  Labs Reviewed  POCT I-STAT, CHEM 8 - Abnormal; Notable for the following:    BUN <3 (*)    Creatinine, Ser 1.40 (*)    All other components within normal limits  ETHANOL  CBC WITH DIFFERENTIAL   Ct Head Wo Contrast  02/09/2013   *RADIOLOGY REPORT*  Clinical Data:  Alcohol intoxication, medical clearance.  Syncopal episode.  Head abrasions.  CT HEAD WITHOUT CONTRAST CT CERVICAL SPINE WITHOUT CONTRAST  Technique:  Multidetector CT imaging of the head and cervical spine was performed following the standard  protocol without intravenous contrast.  Multiplanar CT image reconstructions of the cervical spine were also generated.  Comparison:  Multiple priors, most recent 01/20/2013.  CT HEAD  Findings: There is no evidence for acute infarction, intracranial hemorrhage, mass lesion, hydrocephalus, or extra-axial fluid. Moderate cerebral and cerebellar atrophy is premature for the patient's age.  Hypodensity of the white matter representing chronic microvascular ischemic change is stable.  There is no skull fracture. An  acute scalp hematoma is noted in the left parietal region. Tiny radiopaque foreign bodies are seen in the left frontal scalp which are chronic.  Large arachnoid granulation  affects the right occipital bone near the transverse sinus.  There is no acute paranasal sinus or mastoid disease.  The gaze is disconjugate.  This appearance has been noted previously.  Correlate clinically for alternating exotropia.  IMPRESSION: Premature cerebral and cerebellar atrophy. Stable exam compared with recent priors except for the left parietal scalp hematoma.  CT CERVICAL SPINE  Findings: There is mild reversal of the normal cervical lordotic curve.  There is disc space narrowing at C5-6 with anterior osteophytic spurring.  No acute compression fractures are seen. There is no visible intraspinal hematoma or prevertebral soft tissue swelling.  Premature carotid calcification is evident. There is no pneumothorax or lung apex lesion.  The C1-2 articulation appears normal.  IMPRESSION: Stable cervical spondylosis.  No acute findings.   Original Report Authenticated By: Davonna Belling, M.D.   Ct Cervical Spine Wo Contrast  02/09/2013   *RADIOLOGY REPORT*  Clinical Data:  Alcohol intoxication, medical clearance.  Syncopal episode.  Head abrasions.  CT HEAD WITHOUT CONTRAST CT CERVICAL SPINE WITHOUT CONTRAST  Technique:  Multidetector CT imaging of the head and cervical spine was performed following the standard protocol without  intravenous contrast.  Multiplanar CT image reconstructions of the cervical spine were also generated.  Comparison:  Multiple priors, most recent 01/20/2013.  CT HEAD  Findings: There is no evidence for acute infarction, intracranial hemorrhage, mass lesion, hydrocephalus, or extra-axial fluid. Moderate cerebral and cerebellar atrophy is premature for the patient's age.  Hypodensity of the white matter representing chronic microvascular ischemic change is stable.  There is no skull fracture. An  acute scalp hematoma is noted in the left parietal region. Tiny radiopaque foreign bodies are seen in the left frontal scalp which are chronic.  Large arachnoid granulation  affects the right occipital bone near the transverse sinus.  There is no acute paranasal sinus or mastoid disease.  The gaze is disconjugate.  This appearance has been noted previously.  Correlate clinically for alternating exotropia.  IMPRESSION: Premature cerebral and cerebellar atrophy. Stable exam compared with recent priors except for the left parietal scalp hematoma.  CT CERVICAL SPINE  Findings: There is mild reversal of the normal cervical lordotic curve.  There is disc space narrowing  at C5-6 with anterior osteophytic spurring.  No acute compression fractures are seen. There is no visible intraspinal hematoma or prevertebral soft tissue swelling.  Premature carotid calcification is evident. There is no pneumothorax or lung apex lesion.  The C1-2 articulation appears normal.  IMPRESSION: Stable cervical spondylosis.  No acute findings.   Original Report Authenticated By: Davonna Belling, M.D.      MDM  I suspect the patient's symptoms are related to alcohol intoxication. He will be monitored in the emergency department. Case will be turned over to the oncoming physician        Celene Kras, MD 02/09/13 1622

## 2013-02-09 NOTE — ED Notes (Signed)
Patient states that he has been drinking since 0700 today. Patient states he fell and hit his head on he wall. An abrasion and small hematoma present to the top of patient's head.

## 2013-02-09 NOTE — Progress Notes (Signed)
During [facility] ED 01/1913 visit CM spoke with pt's brother who confirms self pay [facility] resident with no pcp. Pt asleep Eyes closed respirations non labored.  CM discussed and provided written information for self pay pcps, importance of pcp for f/u care, www.needymeds.org, discounted pharmacies and other [place] resources such as financial assistance, DSS and  health department  Reviewed resources for [employer] self pay pcps like [facility], family medicine at [address], [facility] family practice, general medical clinics, [facility] urgent care plus others, [facility] out [facility]ies and housing Pt's brother  voiced understanding and appreciation of resources provided

## 2013-02-09 NOTE — ED Notes (Addendum)
Pt IV infiltrated and removed by first shift RN. Spoke with Dr Rubin Payor who said to take vitals and he would let us know if last bag of NS needs to be infused.

## 2013-02-09 NOTE — ED Notes (Signed)
Per EMS,  Has been drinking all day-went to side of house to pee and fell over-would like detox and some glasses because his eye sight is poor

## 2013-02-09 NOTE — ED Provider Notes (Signed)
Patient feels better and has been able to ambulate. His alcohol level is likely decreased enough for discharge.  Todd Tran. Rubin Payor, MD 02/09/13 [phone]

## 2013-02-09 NOTE — ED Notes (Signed)
Pt was brought in from ems, pt was at home and drinking etoh and stood up and had a syncopal epi and fell feels dizzy and has abrasions to top of head from fall, pt has slurred speech alert x4,

## 2013-02-23 ENCOUNTER — Emergency Department (HOSPITAL_COMMUNITY)
Admission: EM | Admit: 2013-02-23 | Discharge: 2013-02-24 | Disposition: A | Discharge status: Home / Self Care | Payer: Self-pay | Attending: Emergency Medicine | Admitting: Emergency Medicine

## 2013-02-23 ENCOUNTER — Encounter (HOSPITAL_COMMUNITY): Payer: Self-pay | Admitting: *Deleted

## 2013-02-23 ENCOUNTER — Emergency Department (HOSPITAL_COMMUNITY): Payer: Self-pay

## 2013-02-23 DIAGNOSIS — F17200 Nicotine dependence, unspecified, uncomplicated: Secondary | ICD-10-CM | POA: Insufficient documentation

## 2013-02-23 DIAGNOSIS — M25562 Pain in left knee: Secondary | ICD-10-CM

## 2013-02-23 DIAGNOSIS — I10 Essential (primary) hypertension: Secondary | ICD-10-CM | POA: Insufficient documentation

## 2013-02-23 DIAGNOSIS — M25569 Pain in unspecified knee: Secondary | ICD-10-CM | POA: Insufficient documentation

## 2013-02-23 DIAGNOSIS — Z8669 Personal history of other diseases of the nervous system and sense organs: Secondary | ICD-10-CM | POA: Insufficient documentation

## 2013-02-23 DIAGNOSIS — F1010 Alcohol abuse, uncomplicated: Secondary | ICD-10-CM | POA: Insufficient documentation

## 2013-02-23 LAB — RAPID URINE DRUG SCREEN, HOSP PERFORMED
Benzodiazepines: NOT DETECTED
Cocaine: NOT DETECTED
Opiates: NOT DETECTED

## 2013-02-23 LAB — CBC WITH DIFFERENTIAL/PLATELET
Basophils Relative: 2 % — ABNORMAL HIGH (ref 0–1)
Eosinophils Absolute: 0.1 10*3/uL (ref 0.0–0.7)
Hemoglobin: 13.3 g/dL (ref 13.0–17.0)
MCH: 31.7 pg (ref 26.0–34.0)
MCHC: 33.9 g/dL (ref 30.0–36.0)
Monocytes Relative: 11 % (ref 3–12)
Neutrophils Relative %: 39 % — ABNORMAL LOW (ref 43–77)
Platelets: 159 10*3/uL (ref 150–400)

## 2013-02-23 LAB — COMPREHENSIVE METABOLIC PANEL
Albumin: 3.7 g/dL (ref 3.5–5.2)
Alkaline Phosphatase: 123 U/L — ABNORMAL HIGH (ref 39–117)
BUN: 3 mg/dL — ABNORMAL LOW (ref 6–23)
Calcium: 9.2 mg/dL (ref 8.4–10.5)
Potassium: 3.6 mEq/L (ref 3.5–5.1)
Total Protein: 8.3 g/dL (ref 6.0–8.3)

## 2013-02-23 LAB — URINALYSIS, ROUTINE W REFLEX MICROSCOPIC
Glucose, UA: NEGATIVE mg/dL
Leukocytes, UA: NEGATIVE
Specific Gravity, Urine: 1.011 (ref 1.005–1.030)
pH: 5 (ref 5.0–8.0)

## 2013-02-23 LAB — ETHANOL: Alcohol, Ethyl (B): 436 mg/dL (ref 0–11)

## 2013-02-23 MED ORDER — ONDANSETRON HCL 4 MG PO TABS: 4.0000 mg | ORAL_TABLET | Freq: Three times a day (TID) | ORAL | Status: DC | PRN

## 2013-02-23 MED ORDER — ALUM & MAG HYDROXIDE-SIMETH 200-200-20 MG/5ML PO SUSP: 30.0000 mL | ORAL | Status: DC | PRN

## 2013-02-23 MED ORDER — LORAZEPAM 1 MG PO TABS: 1.0000 mg | ORAL_TABLET | Freq: Four times a day (QID) | ORAL | Status: DC | PRN

## 2013-02-23 MED ORDER — FOLIC ACID 1 MG PO TABS
1.0000 mg | ORAL_TABLET | Freq: Every day | ORAL | Status: DC
  Administered 2013-02-23: 1 mg via ORAL
  Filled 2013-02-23 (×2): qty 1

## 2013-02-23 MED ORDER — FOLIC ACID 1 MG PO TABS
1.0000 mg | ORAL_TABLET | Freq: Every day | ORAL | Status: DC
  Administered 2013-02-24: 1 mg via ORAL

## 2013-02-23 MED ORDER — LORAZEPAM 2 MG/ML IJ SOLN: 1.0000 mg | Freq: Four times a day (QID) | INTRAMUSCULAR | Status: DC | PRN

## 2013-02-23 MED ORDER — ADULT MULTIVITAMIN W/MINERALS CH
1.0000 | ORAL_TABLET | Freq: Every day | ORAL | Status: DC
  Administered 2013-02-23: 1 via ORAL
  Filled 2013-02-23 (×2): qty 1

## 2013-02-23 MED ORDER — ACETAMINOPHEN 325 MG PO TABS: 650.0000 mg | ORAL_TABLET | ORAL | Status: DC | PRN

## 2013-02-23 MED ORDER — THIAMINE HCL 100 MG/ML IJ SOLN: 100.0000 mg | Freq: Every day | INTRAMUSCULAR | Status: DC

## 2013-02-23 MED ORDER — VITAMIN B-1 100 MG PO TABS
100.0000 mg | ORAL_TABLET | Freq: Every day | ORAL | Status: DC
  Administered 2013-02-23 – 2013-02-24 (×2): 100 mg via ORAL
  Filled 2013-02-23 (×2): qty 1

## 2013-02-23 MED ORDER — ZOLPIDEM TARTRATE 5 MG PO TABS: 5.0000 mg | ORAL_TABLET | Freq: Every evening | ORAL | Status: DC | PRN

## 2013-02-23 MED ORDER — NICOTINE 21 MG/24HR TD PT24
21.0000 mg | MEDICATED_PATCH | Freq: Every day | TRANSDERMAL | Status: DC
  Administered 2013-02-23 – 2013-02-24 (×2): 21 mg via TRANSDERMAL
  Filled 2013-02-23 (×2): qty 1

## 2013-02-23 MED ORDER — ADULT MULTIVITAMIN W/MINERALS CH
1.0000 | ORAL_TABLET | Freq: Every day | ORAL | Status: DC
  Administered 2013-02-24: 1 via ORAL

## 2013-02-23 MED ORDER — VITAMIN B-1 100 MG PO TABS: 100.0000 mg | ORAL_TABLET | Freq: Every day | ORAL | Status: DC

## 2013-02-23 NOTE — ED Notes (Signed)
Pt ambulated down hall again. Able to ambulate independently but needs 1 staff stand by still

## 2013-02-23 NOTE — ED Notes (Addendum)
Per ems pt says he was walking with his friend and his knee gave out. So pt let himself down on the grass. Pt friend called 911, reported pt fell, friend reported pt did not fall. He could not walk any further and laid on grass. Friend convinced pt to walk 2 houses down to his friends house and into house. Pt reports because "hospital does not give him pain meds for his knee he has to drink alcohol". Now pt wants help with rehab. Last time he was in rehab he had to leave early for a court date. ems reports pt has court date 7/15.   Pt friend reports he has drank 2 beers today.

## 2013-02-23 NOTE — ED Provider Notes (Signed)
History    CSN: [id number] Arrival date & time 02/23/13  1526  First MD Initiated Contact with Patient 02/23/13 1538     Chief Complaint  Patient presents with  . Alcohol Intoxication  . Knee Pain   (Consider location/radiation/quality/duration/timing/severity/associated sxs/prior Treatment) HPI  52 year old male with history of alcohol abuse was brought in by EMS for further evaluations of alcohol use and also knee pain. Patient reports chronic pain to his left knee. His knee has been giving out on him. States he gave out on him 3 times today, and he was afraid to place any weight on it. Reports pain is throbbing sensation, worsening in the morning, no associated numbness. No recent injury. His brother called EMS to bring patient to the ER for further evaluation. Patient also reports he had been drinking alcohol today "I do not drink that much and I am not intoxicated".  Did not quantify amount.  Denies fever, chills, headache, nausea, vomiting, diarrhea, chest pain, shortness of breath, abdominal pain, back pain. Patient has been seen in the ED several times in the past for alcohol detox. Patient does request for alcohol detox today. Denies SI/HI, or hallucination.  Past Medical History  Diagnosis Date  . ETOH abuse   . 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 Hypertension    History reviewed. No pertinent past surgical history. History reviewed. No pertinent family history. History  Substance Use Topics  . Smoking status: Current Some Day Smoker -- 0.25 packs/day    Types: Cigarettes  . Smokeless tobacco: Not on file  . Alcohol Use: Yes     Comment: drinks beer and wine daily    Review of Systems  Constitutional: Negative for fever.  Respiratory: Negative for shortness of breath.   Cardiovascular: Negative for chest pain.  Neurological: Negative for headaches.  All other systems reviewed and are  negative.    Allergies  Review of patient's allergies indicates no known allergies.  Home Medications   Current Outpatient Rx  Name  Route  Sig  Dispense  Refill  . lisinopril (PRINIVIL,ZESTRIL) 10 MG tablet   Oral   Take 1 tablet (10 mg total) by mouth daily. For high blood pressure control   30 tablet   0   . traZODone (DESYREL) 50 MG tablet   Oral   Take 1 tablet (50 mg total) by mouth at bedtime as needed and may repeat dose one time if needed for sleep. For depression/sleep   60 tablet   0    BP 136/88  Pulse 104  Temp(Src) 98.1 F (36.7 C) (Oral)  Resp 16  SpO2 97% Physical Exam  Nursing note and vitals reviewed. Constitutional: He appears well-developed and well-nourished. No distress.  HENT:  Head: Atraumatic.  Mouth/Throat: Oropharynx is clear and moist.  Eyes: Pupils are equal, round, and reactive to light.  Neck: Neck supple.  Cardiovascular: Intact distal pulses.   Mild tachycardia without murmurs, rubs, gallops  Pulmonary/Chest: Effort normal and breath sounds normal.  Abdominal: Soft. There is no tenderness.  Musculoskeletal: He exhibits tenderness (Left knee with normal appearance, mild tenderness to medial or lateral aspects of knee however with no joint laxity, normal flexion and extension, no edema, or rash.). He exhibits no edema.  Neurological: He is alert.  Patella 2+ bilat, no foot drop  Skin: Skin is warm. No rash noted.  Psychiatric: He has a normal mood  and affect.    ED Course  Procedures (including critical care time)  4:11 PM Pt request alcohol detox.  Also report having chronic L knee pain, no recent trauma.  Medical screening labs ordered. Alcohol level ordered. X-ray of left knee ordered. Patient otherwise in no acute distress. Unsteady gait on ambulation may be secondary to intoxication.   6:12 PM His labs otherwise reassuring with the exception of evidence of transaminitis with AST 147, ALT 67, alkaline phosphatase 123. This is  better than his baseline. Xray of L knee without acute fx or dislocation.  Small effusion    6:10 PM Patient's alcohol level is 436, has similar value in the past.  We'll implement CIWA protocol.    6:19 PM Patient sleeping. Vital signs stable. Easily arousable.  6:39 PM Will consult ACT team  for further management for alcohol detox.  Psych hold filled.  Pt is medically cleared.   Labs Reviewed  CBC WITH DIFFERENTIAL - Abnormal; Notable for the following:    RBC 4.19 (*)    RDW 15.8 (*)    Neutrophils Relative % 39 (*)    Basophils Relative 2 (*)    All other components within normal limits  COMPREHENSIVE METABOLIC PANEL - Abnormal; Notable for the following:    BUN 3 (*)    AST 147 (*)    ALT 67 (*)    Alkaline Phosphatase 123 (*)    All other components within normal limits  ETHANOL - Abnormal; Notable for the following:    Alcohol, Ethyl (B) 436 (*)    All other components within normal limits  URINALYSIS, ROUTINE W REFLEX MICROSCOPIC  URINE RAPID DRUG SCREEN (HOSP PERFORMED)  ETHANOL   Dg Knee Complete 4 Views Left  02/23/2013   *RADIOLOGY REPORT*  Clinical Data: Pain post trauma  LEFT KNEE - COMPLETE 4+ VIEW  Comparison: January 27, 2013  Findings:  Frontal, lateral, and bilateral oblique views were obtained.  There is a bipartite patella.  There is no fracture or dislocation.  There is a small joint effusion.  Joint spaces appear intact.  There is calcification in the popliteal artery.  IMPRESSION: No fracture.  Small effusion. There is a bipartite patella.   Original Report Authenticated By: Bretta Bang, M.D.   1. Alcohol abuse   2. Left knee pain     MDM  BP 111/66  Pulse 85  Temp(Src) 97.9 F (36.6 C) (Oral)  Resp 18  SpO2 96%  I have reviewed nursing notes and vital signs. I personally reviewed the imaging tests through PACS system  I reviewed available ER/hospitalization records thought the EMR   Fayrene Helper, [place] 02/23/13 1943

## 2013-02-23 NOTE — Progress Notes (Signed)
During [facility] ED 7/314 visit CM spoke with pt who confirms self pay [facility] resident with no pcp. CM discussed and provided written information for self pay pcps, importance of pcp for f/u care, www.needymeds.org, discounted pharmacies and other [place] resources such as financial assistance, DSS and  health department  Reviewed resources for [employer] self pay pcps like [facility], family medicine at [address], [facility] family practice, general medical clinics, [facility] urgent care plus others, [facility] out [facility]ies and housing Pt voiced understanding and appreciation of resources provided

## 2013-02-23 NOTE — ED Notes (Signed)
[id number]<AV> Expected date:<BR> Expected time:<BR> Means of arrival:<BR> Comments:<BR> EMS-knee pain/ETOH

## 2013-02-23 NOTE — ED Notes (Signed)
Pt's belonging bag in locker number 10

## 2013-02-23 NOTE — BH Assessment (Signed)
[facility] Assessment Progress Note     Pt BAL was over 400 when he arrived after 1500.  Pt has long hx of alcohol abuse & dependency.  Pt usually declines detox tx once sober based on record review.  ACT will wait until pt is sober and assess to determine if pt wants tx. If so, assessment will be completed and a dispo will be determined.  Currently, [facility], [facility] and [facility] are full.

## 2013-02-23 NOTE — ED Notes (Addendum)
Pt able to ambulate in hall independently but with staff standing by

## 2013-02-23 NOTE — ED Provider Notes (Signed)
Medical screening examination/treatment/procedure(s) were performed by non-physician practitioner and as supervising physician I was immediately available for consultation/collaboration.   Othello Dickenson, MD 02/23/13 2309 

## 2013-02-23 NOTE — ED Notes (Signed)
Pt reports detoxing at [facility] in May and being sober until the end of May. He has a court date July 15th something about a check so he says he wants detox but it has to be done before his court date. He said he started drinking again due to problems with his gf and problems living with his uncle which he still lives with currently. No prior suicide attempts. Not SI/HI/AVH at this time. No substance abuse other than ETOH. He reports drinking daily for a month 3-4 beers 40 oz size and sharing them with others. Denies any drug use. He's detoxed twice.

## 2013-02-23 NOTE — ED Notes (Signed)
Pt back from x-ray.

## 2013-02-24 ENCOUNTER — Encounter (HOSPITAL_COMMUNITY): Payer: Self-pay | Admitting: Registered Nurse

## 2013-02-24 DIAGNOSIS — F1010 Alcohol abuse, uncomplicated: Secondary | ICD-10-CM

## 2013-02-24 NOTE — Consult Note (Signed)
Reason for Consult: Evaluation for inpatient treatment Referring Physician:  EDP  Todd Tran is an 52 y.o. male.  HPI: Patient presented to [facility] via EMS.  Patient states that he was brought to the ED related to falling and hurting his leg while intoxicated.  Patient states that his brother called EMS.  Patient denise suicidal ideation, homicidal ideation, psychosis, and paranoia.  Patient states that he drinks beer and wine everyday. Patient was unable to tell the amount of what he drank "I don't know how much it's 4 or 5 of [place] drinking together.  Patient states that he does not want to miss his court date.  Patient states that he is not interested in rehab services at this time.    Past Medical History  Diagnosis Date  . ETOH abuse   . 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 Hypertension     History reviewed. No pertinent past surgical history.  History reviewed. No pertinent family history.  Social History:  reports that he has been smoking Cigarettes.  He has been smoking about 0.25 packs per day. He does not have any smokeless tobacco history on file. He reports that  drinks alcohol. He reports that he does not use illicit drugs.  Allergies: No Known Allergies  Medications: I have reviewed the patient's current medications.  Results for orders placed during the hospital encounter of 02/23/13 (from the past 48 hour(s))  CBC WITH DIFFERENTIAL     Status: Abnormal   Collection Time    02/23/13  3:47 PM      Result Value Range   WBC 4.9  4.0 - 10.5 K/uL   RBC 4.19 (*) 4.22 - 5.81 MIL/uL   Hemoglobin 13.3  13.0 - 17.0 g/dL   HCT 84.6  96.2 - 95.2 %   MCV 93.6  78.0 - 100.0 fL   MCH 31.7  26.0 - 34.0 pg   MCHC 33.9  30.0 - 36.0 g/dL   RDW 84.1 (*) 32.4 - 40.1 %   Platelets 159  150 - 400 K/uL   Neutrophils Relative % 39 (*) 43 - 77 %   Neutro Abs 1.9  1.7 - 7.7 K/uL   Lymphocytes Relative 45  12 - 46 %   Lymphs  Abs 2.2  0.7 - 4.0 K/uL   Monocytes Relative 11  3 - 12 %   Monocytes Absolute 0.6  0.1 - 1.0 K/uL   Eosinophils Relative 3  0 - 5 %   Eosinophils Absolute 0.1  0.0 - 0.7 K/uL   Basophils Relative 2 (*) 0 - 1 %   Basophils Absolute 0.1  0.0 - 0.1 K/uL  COMPREHENSIVE METABOLIC PANEL     Status: Abnormal   Collection Time    02/23/13  3:47 PM      Result Value Range   Sodium 141  135 - 145 mEq/L   Potassium 3.6  3.5 - 5.1 mEq/L   Chloride 104  96 - 112 mEq/L   CO2 24  19 - 32 mEq/L   Glucose, Bld 87  70 - 99 mg/dL   BUN 3 (*) 6 - 23 mg/dL   Creatinine, Ser 0.27  0.50 - 1.35 mg/dL   Calcium 9.2  8.4 - 25.3 mg/dL   Total Protein 8.3  6.0 - 8.3 g/dL   Albumin 3.7  3.5 - 5.2 g/dL   AST 664 (*) 0 -  37 U/L   ALT 67 (*) 0 - 53 U/L   Alkaline Phosphatase 123 (*) 39 - 117 U/L   Total Bilirubin 0.5  0.3 - 1.2 mg/dL   GFR calc non Af Amer >90  >90 mL/min   GFR calc Af Amer >90  >90 mL/min   Comment:            The eGFR has been calculated     using the CKD EPI equation.     This calculation has not been     validated in all clinical     situations.     eGFR's persistently     <90 mL/min signify     possible Chronic Kidney Disease.  ETHANOL     Status: Abnormal   Collection Time    02/23/13  3:47 PM      Result Value Range   Alcohol, Ethyl (B) 436 (*) 0 - 11 mg/dL   Comment:            LOWEST DETECTABLE LIMIT FOR     SERUM ALCOHOL IS 11 mg/dL     FOR MEDICAL PURPOSES ONLY     CRITICAL RESULT CALLED TO, READ BACK BY AND VERIFIED WITH:     JEFFRIEST/[id number]/MURPHYD  URINALYSIS, ROUTINE W REFLEX MICROSCOPIC     Status: None   Collection Time    02/23/13  3:50 PM      Result Value Range   Color, Urine YELLOW  YELLOW   APPearance CLEAR  CLEAR   Specific Gravity, Urine 1.011  1.005 - 1.030   pH 5.0  5.0 - 8.0   Glucose, UA NEGATIVE  NEGATIVE mg/dL   Hgb urine dipstick NEGATIVE  NEGATIVE   Bilirubin Urine NEGATIVE  NEGATIVE   Ketones, ur NEGATIVE  NEGATIVE mg/dL   Protein,  ur NEGATIVE  NEGATIVE mg/dL   Urobilinogen, UA 1.0  0.0 - 1.0 mg/dL   Nitrite NEGATIVE  NEGATIVE   Leukocytes, UA NEGATIVE  NEGATIVE   Comment: MICROSCOPIC NOT DONE ON URINES WITH NEGATIVE PROTEIN, BLOOD, LEUKOCYTES, NITRITE, OR GLUCOSE <1000 mg/dL.  URINE RAPID DRUG SCREEN (HOSP PERFORMED)     Status: None   Collection Time    02/23/13  3:50 PM      Result Value Range   Opiates NONE DETECTED  NONE DETECTED   Cocaine NONE DETECTED  NONE DETECTED   Benzodiazepines NONE DETECTED  NONE DETECTED   Amphetamines NONE DETECTED  NONE DETECTED   Tetrahydrocannabinol NONE DETECTED  NONE DETECTED   Barbiturates NONE DETECTED  NONE DETECTED   Comment:            DRUG SCREEN FOR MEDICAL PURPOSES     ONLY.  IF CONFIRMATION IS NEEDED     FOR ANY PURPOSE, NOTIFY LAB     WITHIN 5 DAYS.                LOWEST DETECTABLE LIMITS     FOR URINE DRUG SCREEN     Drug Class       Cutoff (ng/mL)     Amphetamine      1000     Barbiturate      200     Benzodiazepine   200     Tricyclics       300     Opiates          300     Cocaine          300     THC  50  ETHANOL     Status: Abnormal   Collection Time    02/23/13  7:44 PM      Result Value Range   Alcohol, Ethyl (B) 353 (*) 0 - 11 mg/dL   Comment:            LOWEST DETECTABLE LIMIT FOR     SERUM ALCOHOL IS 11 mg/dL     FOR MEDICAL PURPOSES ONLY    Dg Knee Complete 4 Views Left  02/23/2013   *RADIOLOGY REPORT*  Clinical Data: Pain post trauma  LEFT KNEE - COMPLETE 4+ VIEW  Comparison: January 27, 2013  Findings:  Frontal, lateral, and bilateral oblique views were obtained.  There is a bipartite patella.  There is no fracture or dislocation.  There is a small joint effusion.  Joint spaces appear intact.  There is calcification in the popliteal artery.  IMPRESSION: No fracture.  Small effusion. There is a bipartite patella.   Original Report Authenticated By: Bretta Bang, M.D.    Review of Systems  Psychiatric/Behavioral: Positive for  substance abuse (ETOH  "beer and wine"). Negative for depression, suicidal ideas, hallucinations and memory loss. The patient is not nervous/anxious and does not have insomnia.    Blood pressure 155/97, pulse 108, temperature 98.5 F (36.9 C), temperature source Oral, resp. rate 20, SpO2 99.00%. Physical Exam  HENT:  Head: Normocephalic and atraumatic.  Neck: Normal range of motion.  Respiratory: Effort normal.  Musculoskeletal: Normal range of motion.  Skin: Skin is warm and dry.  Psychiatric: His mood appears not anxious. He is not agitated, not aggressive and not withdrawn. Thought content is not paranoid and not delusional. He does not exhibit a depressed mood. He expresses no homicidal and no suicidal ideation.  Patient states that he is not interested in detox or rehab.  Patient states that he has a court date March 07, 2013 "about a check'   Face to face interview and consulted Dr. Lolly Mustache  Assessment/Plan:  Axis I: Alcohol Abuse Axis II: Deferred Axis III:  Past Medical History  Diagnosis Date  . ETOH abuse   . 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 Hypertension    Axis IV: other psychosocial or environmental problems, problems related to legal system/crime and problems related to social environment Axis V: 51-60 moderate symptoms  Recommendation:  Discharge home with resources for outpatient rehab services.   Rankin, Shuvon, FNP-BC 02/24/2013, 10:14 AM     I agreed with the findings and involved in the treatment plan.

## 2013-02-24 NOTE — ED Provider Notes (Signed)
1025 pt alert , ambulatory [facility] wishes to go home,. Pt given resources for outpt therapy for substance abuse.LFTs improved over last visit Results for orders placed during the hospital encounter of 02/23/13  CBC WITH DIFFERENTIAL      Result Value Range   WBC 4.9  4.0 - 10.5 K/uL   RBC 4.19 (*) 4.22 - 5.81 MIL/uL   Hemoglobin 13.3  13.0 - 17.0 g/dL   HCT 81.1  91.4 - 78.2 %   MCV 93.6  78.0 - 100.0 fL   MCH 31.7  26.0 - 34.0 pg   MCHC 33.9  30.0 - 36.0 g/dL   RDW 95.6 (*) 21.3 - 08.6 %   Platelets 159  150 - 400 K/uL   Neutrophils Relative % 39 (*) 43 - 77 %   Neutro Abs 1.9  1.7 - 7.7 K/uL   Lymphocytes Relative 45  12 - 46 %   Lymphs Abs 2.2  0.7 - 4.0 K/uL   Monocytes Relative 11  3 - 12 %   Monocytes Absolute 0.6  0.1 - 1.0 K/uL   Eosinophils Relative 3  0 - 5 %   Eosinophils Absolute 0.1  0.0 - 0.7 K/uL   Basophils Relative 2 (*) 0 - 1 %   Basophils Absolute 0.1  0.0 - 0.1 K/uL  COMPREHENSIVE METABOLIC PANEL      Result Value Range   Sodium 141  135 - 145 mEq/L   Potassium 3.6  3.5 - 5.1 mEq/L   Chloride 104  96 - 112 mEq/L   CO2 24  19 - 32 mEq/L   Glucose, Bld 87  70 - 99 mg/dL   BUN 3 (*) 6 - 23 mg/dL   Creatinine, Ser 5.78  0.50 - 1.35 mg/dL   Calcium 9.2  8.4 - 46.9 mg/dL   Total Protein 8.3  6.0 - 8.3 g/dL   Albumin 3.7  3.5 - 5.2 g/dL   AST 629 (*) 0 - 37 U/L   ALT 67 (*) 0 - 53 U/L   Alkaline Phosphatase 123 (*) 39 - 117 U/L   Total Bilirubin 0.5  0.3 - 1.2 mg/dL   GFR calc non Af Amer >90  >90 mL/min   GFR calc Af Amer >90  >90 mL/min  URINALYSIS, ROUTINE W REFLEX MICROSCOPIC      Result Value Range   Color, Urine YELLOW  YELLOW   APPearance CLEAR  CLEAR   Specific Gravity, Urine 1.011  1.005 - 1.030   pH 5.0  5.0 - 8.0   Glucose, UA NEGATIVE  NEGATIVE mg/dL   Hgb urine dipstick NEGATIVE  NEGATIVE   Bilirubin Urine NEGATIVE  NEGATIVE   Ketones, ur NEGATIVE  NEGATIVE mg/dL   Protein, ur NEGATIVE  NEGATIVE mg/dL   Urobilinogen, UA 1.0  0.0 - 1.0 mg/dL    Nitrite NEGATIVE  NEGATIVE   Leukocytes, UA NEGATIVE  NEGATIVE  ETHANOL      Result Value Range   Alcohol, Ethyl (B) 436 (*) 0 - 11 mg/dL  URINE RAPID DRUG SCREEN (HOSP PERFORMED)      Result Value Range   Opiates NONE DETECTED  NONE DETECTED   Cocaine NONE DETECTED  NONE DETECTED   Benzodiazepines NONE DETECTED  NONE DETECTED   Amphetamines NONE DETECTED  NONE DETECTED   Tetrahydrocannabinol NONE DETECTED  NONE DETECTED   Barbiturates NONE DETECTED  NONE DETECTED  ETHANOL      Result Value Range   Alcohol, Ethyl (B) 353 (*)  0 - 11 mg/dL   Dg Knee 2 Views Left  01/27/2013   *RADIOLOGY REPORT*  Clinical Data: Fall  LEFT KNEE - 1-2 VIEW  Comparison: 12/31/2012  Findings: Two views of the left knee submitted.  No acute fracture or subluxation.  Bipartite patella again noted.  Small joint effusion.  IMPRESSION: No acute fracture or subluxation.  Small joint effusion.   Original Report Authenticated By: Natasha Mead, M.D.   Dg Ankle 2 Views Left  01/27/2013   *RADIOLOGY REPORT*  Clinical Data: Injury to left ankle.  Medial pain and swelling.  LEFT ANKLE - 2 VIEW  Comparison: 01/12/2013  Findings: Previously noted small avulsion fracture of the medial malleolus is not definitively identified on the two-view study. There is no evidence of visible acute fracture or dislocation. There is some chronic sclerosis and irregularity of the medial malleolus.  IMPRESSION: No acute fracture is identified.  The recently noted small avulsion fracture of the medial malleolus is not visible on the two-view study.   Original Report Authenticated By: Irish Lack, M.D.   Ct Head Wo Contrast  02/09/2013   *RADIOLOGY REPORT*  Clinical Data:  Alcohol intoxication, medical clearance.  Syncopal episode.  Head abrasions.  CT HEAD WITHOUT CONTRAST CT CERVICAL SPINE WITHOUT CONTRAST  Technique:  Multidetector CT imaging of the head and cervical spine was performed following the standard protocol without intravenous  contrast.  Multiplanar CT image reconstructions of the cervical spine were also generated.  Comparison:  Multiple priors, most recent 01/20/2013.  CT HEAD  Findings: There is no evidence for acute infarction, intracranial hemorrhage, mass lesion, hydrocephalus, or extra-axial fluid. Moderate cerebral and cerebellar atrophy is premature for the patient's age.  Hypodensity of the white matter representing chronic microvascular ischemic change is stable.  There is no skull fracture. An  acute scalp hematoma is noted in the left parietal region. Tiny radiopaque foreign bodies are seen in the left frontal scalp which are chronic.  Large arachnoid granulation  affects the right occipital bone near the transverse sinus.  There is no acute paranasal sinus or mastoid disease.  The gaze is disconjugate.  This appearance has been noted previously.  Correlate clinically for alternating exotropia.  IMPRESSION: Premature cerebral and cerebellar atrophy. Stable exam compared with recent priors except for the left parietal scalp hematoma.  CT CERVICAL SPINE  Findings: There is mild reversal of the normal cervical lordotic curve.  There is disc space narrowing at C5-6 with anterior osteophytic spurring.  No acute compression fractures are seen. There is no visible intraspinal hematoma or prevertebral soft tissue swelling.  Premature carotid calcification is evident. There is no pneumothorax or lung apex lesion.  The C1-2 articulation appears normal.  IMPRESSION: Stable cervical spondylosis.  No acute findings.   Original Report Authenticated By: Davonna Belling, M.D.   Ct Cervical Spine Wo Contrast  02/09/2013   *RADIOLOGY REPORT*  Clinical Data:  Alcohol intoxication, medical clearance.  Syncopal episode.  Head abrasions.  CT HEAD WITHOUT CONTRAST CT CERVICAL SPINE WITHOUT CONTRAST  Technique:  Multidetector CT imaging of the head and cervical spine was performed following the standard protocol without intravenous contrast.   Multiplanar CT image reconstructions of the cervical spine were also generated.  Comparison:  Multiple priors, most recent 01/20/2013.  CT HEAD  Findings: There is no evidence for acute infarction, intracranial hemorrhage, mass lesion, hydrocephalus, or extra-axial fluid. Moderate cerebral and cerebellar atrophy is premature for the patient's age.  Hypodensity of the white matter representing chronic  microvascular ischemic change is stable.  There is no skull fracture. An  acute scalp hematoma is noted in the left parietal region. Tiny radiopaque foreign bodies are seen in the left frontal scalp which are chronic.  Large arachnoid granulation  affects the right occipital bone near the transverse sinus.  There is no acute paranasal sinus or mastoid disease.  The gaze is disconjugate.  This appearance has been noted previously.  Correlate clinically for alternating exotropia.  IMPRESSION: Premature cerebral and cerebellar atrophy. Stable exam compared with recent priors except for the left parietal scalp hematoma.  CT CERVICAL SPINE  Findings: There is mild reversal of the normal cervical lordotic curve.  There is disc space narrowing at C5-6 with anterior osteophytic spurring.  No acute compression fractures are seen. There is no visible intraspinal hematoma or prevertebral soft tissue swelling.  Premature carotid calcification is evident. There is no pneumothorax or lung apex lesion.  The C1-2 articulation appears normal.  IMPRESSION: Stable cervical spondylosis.  No acute findings.   Original Report Authenticated By: Davonna Belling, M.D.   Dg Knee Complete 4 Views Left  02/23/2013   *RADIOLOGY REPORT*  Clinical Data: Pain post trauma  LEFT KNEE - COMPLETE 4+ VIEW  Comparison: January 27, 2013  Findings:  Frontal, lateral, and bilateral oblique views were obtained.  There is a bipartite patella.  There is no fracture or dislocation.  There is a small joint effusion.  Joint spaces appear intact.  There is calcification in  the popliteal artery.  IMPRESSION: No fracture.  Small effusion. There is a bipartite patella.   Original Report Authenticated By: Bretta Bang, M.D.     Doug Sou, MD 02/24/13 1739

## 2013-03-29 ENCOUNTER — Encounter (HOSPITAL_COMMUNITY): Payer: Self-pay | Admitting: Adult Health

## 2013-03-29 ENCOUNTER — Emergency Department (HOSPITAL_COMMUNITY)
Admission: EM | Admit: 2013-03-29 | Discharge: 2013-03-29 | Discharge status: Against Medical Advice | Payer: Self-pay | Attending: Emergency Medicine | Admitting: Emergency Medicine

## 2013-03-29 DIAGNOSIS — F17200 Nicotine dependence, unspecified, uncomplicated: Secondary | ICD-10-CM | POA: Insufficient documentation

## 2013-03-29 DIAGNOSIS — F1010 Alcohol abuse, uncomplicated: Secondary | ICD-10-CM | POA: Insufficient documentation

## 2013-03-29 LAB — CBC
Hemoglobin: 13.4 g/dL (ref 13.0–17.0)
Platelets: 140 10*3/uL — ABNORMAL LOW (ref 150–400)
RBC: 4.11 MIL/uL — ABNORMAL LOW (ref 4.22–5.81)
WBC: 5.4 10*3/uL (ref 4.0–10.5)

## 2013-03-29 LAB — ACETAMINOPHEN LEVEL: Acetaminophen (Tylenol), Serum: <15 ug/mL (ref 10–30)

## 2013-03-29 LAB — COMPREHENSIVE METABOLIC PANEL
ALT: 41 U/L (ref 0–53)
AST: 106 U/L — ABNORMAL HIGH (ref 0–37)
Alkaline Phosphatase: 127 U/L — ABNORMAL HIGH (ref 39–117)
CO2: 27 mEq/L (ref 19–32)
Calcium: 9 mg/dL (ref 8.4–10.5)
GFR calc Af Amer: >90 mL/min (ref >90)
GFR calc non Af Amer: >90 mL/min (ref >90)
Glucose, Bld: 111 mg/dL — ABNORMAL HIGH (ref 70–99)
Potassium: 3.4 mEq/L — ABNORMAL LOW (ref 3.5–5.1)
Sodium: 141 mEq/L (ref 135–145)

## 2013-03-29 NOTE — ED Notes (Signed)
Unable to locate pt to take to treatment room. 

## 2013-03-29 NOTE — ED Notes (Addendum)
Presents with strong odor of ETOH, admits to ETOH use. Easily arousable to voice. C/o left knee pain. Pt with slurred speech. Admits to drinking since early this morning.  Requesting ETOH detox.

## 2013-03-29 NOTE — ED Notes (Signed)
No answer x3

## 2013-04-07 ENCOUNTER — Emergency Department (HOSPITAL_COMMUNITY)
Admission: EM | Admit: 2013-04-07 | Discharge: 2013-04-07 | Disposition: A | Discharge status: Home / Self Care | Payer: Self-pay | Attending: Emergency Medicine | Admitting: Emergency Medicine

## 2013-04-07 ENCOUNTER — Emergency Department (HOSPITAL_COMMUNITY): Payer: Self-pay

## 2013-04-07 ENCOUNTER — Encounter (HOSPITAL_COMMUNITY): Payer: Self-pay | Admitting: *Deleted

## 2013-04-07 DIAGNOSIS — Z87728 Personal history of other specified (corrected) congenital malformations of nervous system and sense organs: Secondary | ICD-10-CM | POA: Insufficient documentation

## 2013-04-07 DIAGNOSIS — Z87828 Personal history of other (healed) physical injury and trauma: Secondary | ICD-10-CM | POA: Insufficient documentation

## 2013-04-07 DIAGNOSIS — F10229 Alcohol dependence with intoxication, unspecified: Secondary | ICD-10-CM | POA: Insufficient documentation

## 2013-04-07 DIAGNOSIS — I10 Essential (primary) hypertension: Secondary | ICD-10-CM | POA: Insufficient documentation

## 2013-04-07 DIAGNOSIS — F17200 Nicotine dependence, unspecified, uncomplicated: Secondary | ICD-10-CM | POA: Insufficient documentation

## 2013-04-07 DIAGNOSIS — F10929 Alcohol use, unspecified with intoxication, unspecified: Secondary | ICD-10-CM

## 2013-04-07 LAB — CBC WITH DIFFERENTIAL/PLATELET
Eosinophils Absolute: 0.2 10*3/uL (ref 0.0–0.7)
Eosinophils Relative: 3 % (ref 0–5)
HCT: 40 % (ref 39.0–52.0)
Lymphocytes Relative: 40 % (ref 12–46)
Lymphs Abs: 2.4 10*3/uL (ref 0.7–4.0)
MCH: 32.1 pg (ref 26.0–34.0)
MCV: 95.2 fL (ref 78.0–100.0)
Monocytes Absolute: 0.5 10*3/uL (ref 0.1–1.0)
Platelets: 116 10*3/uL — ABNORMAL LOW (ref 150–400)
RBC: 4.2 MIL/uL — ABNORMAL LOW (ref 4.22–5.81)
RDW: 13.6 % (ref 11.5–15.5)
WBC: 5.9 10*3/uL (ref 4.0–10.5)

## 2013-04-07 LAB — COMPREHENSIVE METABOLIC PANEL
CO2: 25 mEq/L (ref 19–32)
Calcium: 9.5 mg/dL (ref 8.4–10.5)
Creatinine, Ser: 0.9 mg/dL (ref 0.50–1.35)
GFR calc Af Amer: >90 mL/min (ref >90)
GFR calc non Af Amer: >90 mL/min (ref >90)
Glucose, Bld: 105 mg/dL — ABNORMAL HIGH (ref 70–99)
Total Protein: 8.4 g/dL — ABNORMAL HIGH (ref 6.0–8.3)

## 2013-04-07 LAB — RAPID URINE DRUG SCREEN, HOSP PERFORMED
Benzodiazepines: NOT DETECTED
Cocaine: NOT DETECTED
Opiates: NOT DETECTED

## 2013-04-07 LAB — ETHANOL: Alcohol, Ethyl (B): 405 mg/dL (ref 0–11)

## 2013-04-07 NOTE — ED Notes (Signed)
Patient walked down hall and back.  Patient tolerated well.

## 2013-04-07 NOTE — ED Provider Notes (Signed)
Medical screening examination/treatment/procedure(s) were performed by non-physician practitioner and as supervising physician I was immediately available for consultation/collaboration.  Raeford Razor, MD 04/07/13 1007

## 2013-04-07 NOTE — ED Provider Notes (Signed)
Todd Tran is a 52 y.o. male who was signed out to me at shift change. Pt with hx of ETOH abuse, presented today with possible seizure, pt was found to be intoxicated.  His lab work showed alcohol level of 405. PT was monitored over night. He has been sleeping with no complaints.   8:12 AM Pt is now awake. Alert. Ambulated up and down hallway with no problems. Pt was monitored for almost 8 hrs now.  Pt stable for d/c home at this time with his brother. Will be traveling by a bus.   Filed Vitals:   04/07/13 0602 04/07/13 0700 04/07/13 0715 04/07/13 0730  BP: 115/78 115/74 114/81 113/82  Pulse: 70 76 69 73  Temp:      TempSrc:      Resp: 16 16    SpO2: 100% 100% 100% 100%     Lottie Mussel, PA-C 04/07/13 0812  Lottie Mussel, PA-C 04/07/13 0813

## 2013-04-07 NOTE — ED Provider Notes (Signed)
CSN: [id number]     Arrival date & time 04/07/13  0034 History     First MD Initiated Contact with Patient 04/07/13 0118     Chief Complaint  Patient presents with  . Seizures   (Consider location/radiation/quality/duration/timing/severity/associated sxs/prior Treatment) HPI History provided by pt and his brother.  Level 5 caveat applies d/t intoxication.  Patient's brother reports that patient was with his friends last night and had a witnessed seizure.  He is unsure of what time it occurred, how long it lasted, or any characteristics of the seizure.  Did not bite his tongue and was not incontinent of urine.  He seems to be mentating at baseline currently.  Pt reports that he is an alcoholic.  Drank typical amount of alcohol yesterday.  Denies drug abuse.  Does not recall having a seizure.  Has no prior history.  Has had a head injury recently but does not know how it occurred nor when.  Has had intermittent headaches recently.  No recent illnesses.   Past Medical History  Diagnosis Date  . ETOH abuse   . 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 Hypertension    History reviewed. No pertinent past surgical history. History reviewed. No pertinent family history. History  Substance Use Topics  . Smoking status: Current Some Day Smoker -- 0.25 packs/day    Types: Cigarettes  . Smokeless tobacco: Not on file  . Alcohol Use: Yes     Comment: drinks beer and wine daily    Review of Systems  All other systems reviewed and are negative.    Allergies  Review of patient's allergies indicates no known allergies.  Home Medications  No current outpatient prescriptions on file. BP 108/72  Pulse 78  Temp(Src) 98.3 F (36.8 C) (Oral)  Resp 18  SpO2 100% Physical Exam  Nursing note and vitals reviewed. Constitutional: He is oriented to person, place, and time. He appears well-developed and well-nourished. No distress.   HENT:  Head: Normocephalic and atraumatic.  No tongue trauma  Eyes: Pupils are equal, round, and reactive to light.  Strabismus.  Diffuse, mild conjunctival injection.   Neck: Normal range of motion.  Cardiovascular: Normal rate and regular rhythm.   Pulmonary/Chest: Effort normal and breath sounds normal.  Musculoskeletal: Normal range of motion.  Neurological: He is alert and oriented to person, place, and time. Coordination normal.  CN 3-12 intact.  No nystagmus.  Pt reports not being able to feel light touch in LEs, which is chronic.  5/5 and equal upper and lower extremity strength.  No past pointing.     Skin: Skin is warm and dry. No rash noted.  Psychiatric: He has a normal mood and affect. His behavior is normal.    ED Course   Procedures (including critical care time)  Labs Reviewed  CBC WITH DIFFERENTIAL - Abnormal; Notable for the following:    RBC 4.20 (*)    Platelets 116 (*)    All other components within normal limits  COMPREHENSIVE METABOLIC PANEL - Abnormal; Notable for the following:    Glucose, Bld 105 (*)    Total Protein 8.4 (*)    AST 121 (*)    Alkaline Phosphatase 119 (*)    All other components within normal limits  ETHANOL - Abnormal; Notable for the following:    Alcohol, Ethyl (B) 405 (*)    All other components within normal  limits  URINE RAPID DRUG SCREEN (HOSP PERFORMED)   Ct Head Wo Contrast  04/07/2013   *RADIOLOGY REPORT*  Clinical Data: New onset seizure.  CT HEAD WITHOUT CONTRAST  Technique:  Contiguous axial images were obtained from the base of the skull through the vertex without contrast.  Comparison: 02/09/2013.  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  There is smooth erosion of the inner and outer tables. Favor inclusion cyst (such as epidermoid) or large arachnoid granulation.  Orbits: Disconjugate gaze, has been reported  previously.  Brain: No evidence of acute abnormality, such as acute infarction, hemorrhage, hydrocephalus, or mass lesion/mass effect.Cerebral and cerebellar atrophy.  IMPRESSION: 1.  No evidence of acute intracranial disease.  2.  Brain atrophy.   Original Report Authenticated By: Tiburcio Pea   1. Alcohol intoxication     MDM  52yo alcoholic w/ HTN, otherwise healthy, presents w/ new onset seizure.  It is unclear as to whether or not he really had a seizure, because his friends who witnessed it are not present and they were likely all intoxicated.  Patient's alcohol level is >400 this morning.  He reports recent head trauma but does not recall when or how it occurred.  Afebrile, intoxicated, head atraumatic, no focal neuro deficits w/ exception of decreased sensation BLE, reportedly chronic.  CT head negative and labs unremarkable.  Dr. Manus Gunning has evaluated patient but he has become somnolent.  Will re-evaluate him when he sobers.  Kirichenko, PA-C to resume care.  6:30 AM   Otilio Miu, PA-C 04/07/13 [id number]

## 2013-04-07 NOTE — ED Provider Notes (Signed)
Medical screening examination/treatment/procedure(s) were conducted as a shared visit with non-physician practitioner(s) and myself.  I personally evaluated the patient during the encounter  Intoxicated male with questionable seizure activity.  No one available who witnessed incident. Somnolent but arousable, protecting airway. Moving all extremities, no evidence of trauma. CT head negative.  Doubt actual seizure.  ETOH >400. Doubt withdrawal. Will need reevaluation when sober.  Glynn Octave, MD 04/07/13 [phone]

## 2013-04-07 NOTE — ED Notes (Signed)
Per EMS: pt coming from home with c/o seizure like activity. Pt was drinking all day, started to seize, family took pt back to bedroom. Pt was post itcle upon EMS arrival, no oral trauma, incontinent. Pt is A&Ox4, respirations equal and unlabored, skin warm and dry

## 2013-04-14 ENCOUNTER — Emergency Department (HOSPITAL_COMMUNITY)
Admission: EM | Admit: 2013-04-14 | Discharge: 2013-04-14 | Disposition: A | Discharge status: Home / Self Care | Payer: Self-pay | Attending: Emergency Medicine | Admitting: Emergency Medicine

## 2013-04-14 ENCOUNTER — Encounter (HOSPITAL_COMMUNITY): Payer: Self-pay | Admitting: *Deleted

## 2013-04-14 DIAGNOSIS — F17200 Nicotine dependence, unspecified, uncomplicated: Secondary | ICD-10-CM | POA: Insufficient documentation

## 2013-04-14 DIAGNOSIS — I10 Essential (primary) hypertension: Secondary | ICD-10-CM | POA: Insufficient documentation

## 2013-04-14 DIAGNOSIS — F10129 Alcohol abuse with intoxication, unspecified: Secondary | ICD-10-CM

## 2013-04-14 DIAGNOSIS — Z8669 Personal history of other diseases of the nervous system and sense organs: Secondary | ICD-10-CM | POA: Insufficient documentation

## 2013-04-14 DIAGNOSIS — F1010 Alcohol abuse, uncomplicated: Secondary | ICD-10-CM | POA: Insufficient documentation

## 2013-04-14 LAB — RAPID URINE DRUG SCREEN, HOSP PERFORMED
Amphetamines: NOT DETECTED
Opiates: NOT DETECTED

## 2013-04-14 LAB — POCT I-STAT, CHEM 8
BUN: <3 mg/dL — ABNORMAL LOW (ref 6–23)
Creatinine, Ser: 1.5 mg/dL — ABNORMAL HIGH (ref 0.50–1.35)
Glucose, Bld: 100 mg/dL — ABNORMAL HIGH (ref 70–99)
Hemoglobin: 13.9 g/dL (ref 13.0–17.0)
Potassium: 3.5 mEq/L (ref 3.5–5.1)

## 2013-04-14 MED ORDER — DEXTROSE 5 % IV SOLN: Freq: Once | INTRAVENOUS | Status: DC

## 2013-04-14 MED ORDER — THIAMINE HCL 100 MG/ML IJ SOLN
100.0000 mg | Freq: Once | INTRAMUSCULAR | Status: AC
  Administered 2013-04-14: 100 mg via INTRAVENOUS
  Filled 2013-04-14: qty 2

## 2013-04-14 NOTE — ED Notes (Addendum)
Pt brought by [facility] for seizure activity.  According to family, pt was drinking last night and this am.  Pt had experienced 3 seizures in front of family and 3 in front of ems.  Sx last for approx 30 sec and are "full body seizures".  Post ictal times is minimal.  Pt ao x 3 presently.  CBG of 130 per ems.  Been tx for new onset sz here at [facility] in the last month

## 2013-04-14 NOTE — ED Provider Notes (Signed)
CSN: [id number]     Arrival date & time 04/14/13  1018 History     None    Chief Complaint  Patient presents with  . Seizures   (Consider location/radiation/quality/duration/timing/severity/associated sxs/prior Treatment) HPI 52 y o B M, with PMH of Alcohol intoxication with seizures, alcohol dependence presented to the ED by [facility] today with seizures that occurred this morning, witnessed by his brother but no witness present to give an adequate hx. Pt says the last thing he remembers last night was drinking wine with about 6 of his friends last night, before he passed out and woke up this morning in the ambulance on his way to the hospital. Cant remember exactly how much alcohol he had, said he thinks his last drink was last night. No complaints of headache, no tongue pain or bleeding,no pain or bleeding from any other part of his body, no visual changes, pt denies use of any other drugs.   Past Medical History  Diagnosis Date  . ETOH abuse   . 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 Hypertension    History reviewed. No pertinent past surgical history. No family history on file. History  Substance Use Topics  . Smoking status: Current Some Day Smoker -- 0.25 packs/day    Types: Cigarettes  . Smokeless tobacco: Not on file  . Alcohol Use: Yes     Comment: drinks beer and wine daily    Review of Systems No pertinent findings on ROS.   Allergies  Review of patient's allergies indicates no known allergies.  Home Medications  No current outpatient prescriptions on file. BP 111/75  Pulse 76  Temp(Src) 98.2 F (36.8 C) (Oral)  Resp 16  SpO2 98% Physical Exam GENERAL- level of alertness appears to flunctuate,sometimes confused, co-operative, appears as stated age, not in any distress. 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 CARDIAC- RRR, no murmurs, rubs or gallops. RESP- Moving equal volumes of air, and clear to auscultation bilaterally. ABDOMEN- Soft,non tender, no palpable masses or organomegaly, bowel sounds present. BACK- Normal curvature of the spine, No tenderness along the vertebrae, no CVA tenderness. NEURO- Oriented to person, and place, can tell the year and month, but not he day, strenght equal and present in all extremities. EXTREMITIES- pulse 2+, symmetric. SKIN- Warm, dry, No rash or lesion. PSYCH- Normal mood and affect, appropriate thought content and speech.  ED Course   Procedures (including critical care time)  Labs Reviewed  ETHANOL - Abnormal; Notable for the following:    Alcohol, Ethyl (B) 409 (*)    All other components within normal limits  POCT I-STAT, CHEM 8 - Abnormal; Notable for the following:    BUN <3 (*)    Creatinine, Ser 1.50 (*)    Glucose, Bld 100 (*)    All other components within normal limits  URINE RAPID DRUG SCREEN (HOSP PERFORMED)   No results found. No diagnosis found.  MDM   Seizures likely induced by Alcohol intoxication, doubt withdrawal, Blood alcohol levels on admission- 409, pt still intoxicated. Pt will be monitored for a few more hours in the ED to ensure improvement of his mental status. Presently no indication for Neuro imaging- as No neurologic deficits. Patient will be signed out for continued management in the  ED.   Kennis Carina, MD 04/14/13 [phone]

## 2013-04-14 NOTE — ED Provider Notes (Signed)
Rate: 90  Rhythm: normal sinus rhythm  QRS Axis: normal  Intervals: normal  ST/T Wave abnormalities: normal  Conduction Disutrbances:none  Narrative Interpretation:   Old EKG Reviewed: none available     Celene Kras, MD 04/14/13 1537

## 2013-04-14 NOTE — ED Notes (Signed)
Pt waking up.  Requesting good and water.

## 2013-04-14 NOTE — ED Provider Notes (Signed)
I saw and evaluated the patient, reviewed the resident's note and I agree with the findings and plan. Patient presents to the emergency room with alcohol intoxication and possible seizures.  Patient has history of recurrent episodes of alcohol abuse requiring emergency department evaluation. There has been a history of questionable seizures in the past although patient does not take any medications for seizures and never has been formally evaluated.  Suspect his symptoms are related to his alcohol level. We'll continue with IV fluids.  Monitor him closely to make sure his mental status continues to improve. I anticipate the patient will likely be able to be discharged home once he sobers up.  If his mental status declines we'll plan on CT imaging the head and further evaluation   Celene Kras, MD 04/14/13 1204

## 2013-04-14 NOTE — ED Provider Notes (Signed)
5:22 PM Patient is clinically much more sober at this time.  He has family to take him home.  Discharge home in good condition.  Neuro followup  Lyanne Co, MD 04/14/13 1723

## 2013-04-16 ENCOUNTER — Inpatient Hospital Stay (HOSPITAL_COMMUNITY)
Admission: EM | Admit: 2013-04-16 | Discharge: 2013-04-20 | DRG: 897 | Disposition: A | Discharge status: Home / Self Care | Payer: MEDICAID | Attending: Internal Medicine | Admitting: Internal Medicine

## 2013-04-16 ENCOUNTER — Encounter (HOSPITAL_COMMUNITY): Payer: Self-pay | Admitting: Cardiology

## 2013-04-16 DIAGNOSIS — F10939 Alcohol use, unspecified with withdrawal, unspecified: Principal | ICD-10-CM | POA: Diagnosis present

## 2013-04-16 DIAGNOSIS — F10239 Alcohol dependence with withdrawal, unspecified: Principal | ICD-10-CM | POA: Diagnosis present

## 2013-04-16 DIAGNOSIS — Z833 Family history of diabetes mellitus: Secondary | ICD-10-CM

## 2013-04-16 DIAGNOSIS — F17200 Nicotine dependence, unspecified, uncomplicated: Secondary | ICD-10-CM | POA: Diagnosis present

## 2013-04-16 DIAGNOSIS — F1020 Alcohol dependence, uncomplicated: Secondary | ICD-10-CM | POA: Diagnosis present

## 2013-04-16 DIAGNOSIS — R112 Nausea with vomiting, unspecified: Secondary | ICD-10-CM | POA: Diagnosis present

## 2013-04-16 DIAGNOSIS — E876 Hypokalemia: Secondary | ICD-10-CM | POA: Diagnosis present

## 2013-04-16 DIAGNOSIS — IMO0002 Reserved for concepts with insufficient information to code with codable children: Secondary | ICD-10-CM | POA: Diagnosis present

## 2013-04-16 DIAGNOSIS — I10 Essential (primary) hypertension: Secondary | ICD-10-CM | POA: Diagnosis present

## 2013-04-16 HISTORY — DX: Unspecified convulsions: R56.9

## 2013-04-16 LAB — CBC
HCT: 41.3 % (ref 39.0–52.0)
Hemoglobin: 14.4 g/dL (ref 13.0–17.0)
MCH: 33.1 pg (ref 26.0–34.0)
MCV: 94.9 fL (ref 78.0–100.0)
RBC: 4.35 MIL/uL (ref 4.22–5.81)

## 2013-04-16 LAB — COMPREHENSIVE METABOLIC PANEL
BUN: 4 mg/dL — ABNORMAL LOW (ref 6–23)
CO2: 31 mEq/L (ref 19–32)
Calcium: 9.2 mg/dL (ref 8.4–10.5)
Creatinine, Ser: 0.72 mg/dL (ref 0.50–1.35)
GFR calc Af Amer: >90 mL/min (ref >90)
GFR calc non Af Amer: >90 mL/min (ref >90)
Glucose, Bld: 89 mg/dL (ref 70–99)
Total Bilirubin: 0.5 mg/dL (ref 0.3–1.2)

## 2013-04-16 LAB — RAPID URINE DRUG SCREEN, HOSP PERFORMED: Opiates: NOT DETECTED

## 2013-04-16 LAB — SALICYLATE LEVEL: Salicylate Lvl: <2 mg/dL — ABNORMAL LOW (ref 2.8–20.0)

## 2013-04-16 MED ORDER — THIAMINE HCL 100 MG/ML IJ SOLN
100.0000 mg | Freq: Every day | INTRAMUSCULAR | Status: DC
  Filled 2013-04-16: qty 1

## 2013-04-16 MED ORDER — LORAZEPAM 2 MG/ML IJ SOLN: 1.0000 mg | Freq: Four times a day (QID) | INTRAMUSCULAR | Status: DC | PRN

## 2013-04-16 MED ORDER — VITAMIN B-1 100 MG PO TABS
100.0000 mg | ORAL_TABLET | Freq: Every day | ORAL | Status: DC
  Administered 2013-04-16 – 2013-04-20 (×5): 100 mg via ORAL
  Filled 2013-04-16 (×5): qty 1

## 2013-04-16 MED ORDER — LORAZEPAM 1 MG PO TABS
0.0000 mg | ORAL_TABLET | Freq: Four times a day (QID) | ORAL | Status: AC
  Administered 2013-04-17 – 2013-04-18 (×2): 1 mg via ORAL
  Administered 2013-04-18: 2 mg via ORAL
  Administered 2013-04-18: 1 mg via ORAL
  Filled 2013-04-16 (×3): qty 1
  Filled 2013-04-16: qty 2
  Filled 2013-04-16: qty 1

## 2013-04-16 MED ORDER — LORAZEPAM 1 MG PO TABS
1.0000 mg | ORAL_TABLET | Freq: Four times a day (QID) | ORAL | Status: DC | PRN
  Administered 2013-04-17: 1 mg via ORAL
  Filled 2013-04-16: qty 1

## 2013-04-16 MED ORDER — LORAZEPAM 1 MG PO TABS
0.0000 mg | ORAL_TABLET | Freq: Two times a day (BID) | ORAL | Status: DC
  Administered 2013-04-19: 1 mg via ORAL
  Filled 2013-04-16: qty 1

## 2013-04-16 MED ORDER — FOLIC ACID 1 MG PO TABS
1.0000 mg | ORAL_TABLET | Freq: Every day | ORAL | Status: DC
  Administered 2013-04-16 – 2013-04-17 (×2): 1 mg via ORAL
  Filled 2013-04-16 (×2): qty 1

## 2013-04-16 MED ORDER — ADULT MULTIVITAMIN W/MINERALS CH
1.0000 | ORAL_TABLET | Freq: Every day | ORAL | Status: DC
  Administered 2013-04-16 – 2013-04-17 (×2): 1 via ORAL
  Filled 2013-04-16 (×2): qty 1

## 2013-04-16 NOTE — ED Notes (Addendum)
Pt sleeping, no changes, re-positioning self. Calm, NAD, restful. CIWA not done at this time. No s/sx noted to continue further assessment or medicate.

## 2013-04-16 NOTE — ED Provider Notes (Signed)
CSN: [id number]     Arrival date & time 04/16/13  1632 History     First MD Initiated Contact with Patient 04/16/13 1722     Chief Complaint  Patient presents with  . Medical Clearance   (Consider location/radiation/quality/duration/timing/severity/associated sxs/prior Treatment) HPI Comments: Patient presents emergency department with chief complaints of requesting detox from alcohol. He states that he has a long history of alcohol abuse. States his last drink was about an hour ago. Hematologist for this problem, and wants help in quitting. He states that he was seen recently for seizures, which was related to his alcohol abuse. He denies any episodes of seizures today. States that nothing makes his symptoms better or worse. He has tried going through detox before, but was unable to finish, because he had to go to court.  The history is provided by the patient. No language interpreter was used.    Past Medical History  Diagnosis Date  . ETOH abuse   . 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 Hypertension    History reviewed. No pertinent past surgical history. History reviewed. No pertinent family history. History  Substance Use Topics  . Smoking status: Current Some Day Smoker -- 0.25 packs/day    Types: Cigarettes  . Smokeless tobacco: Not on file  . Alcohol Use: Yes     Comment: drinks beer and wine daily    Review of Systems  All other systems reviewed and are negative.    Allergies  Review of patient's allergies indicates no known allergies.  Home Medications  No current outpatient prescriptions on file. BP 140/88  Pulse 92  Temp(Src) 97.8 F (36.6 C) (Oral)  Resp 18  SpO2 100% Physical Exam  Nursing note and vitals reviewed. Constitutional: He is oriented to person, place, and time. He appears well-developed and well-nourished.  HENT:  Head: Normocephalic and atraumatic.  Right Ear: External  ear normal.  Left Ear: External ear normal.  Nose: Nose normal.  Mouth/Throat: Oropharynx is clear and moist. No oropharyngeal exudate.  Eyes: Conjunctivae and EOM are normal. Pupils are equal, round, and reactive to light. Right eye exhibits no discharge. Left eye exhibits no discharge. No scleral icterus.  Neck: Normal range of motion. Neck supple. No JVD present.  Cardiovascular: Normal rate, regular rhythm, normal heart sounds and intact distal pulses.  Exam reveals no gallop and no friction rub.   No murmur heard. Pulmonary/Chest: Effort normal and breath sounds normal. No respiratory distress. He has no wheezes. He has no rales. He exhibits no tenderness.  Abdominal: Soft. Bowel sounds are normal. He exhibits no distension and no mass. There is no tenderness. There is no rebound and no guarding.  Musculoskeletal: Normal range of motion. He exhibits no edema and no tenderness.  Neurological: He is alert and oriented to person, place, and time. He has normal reflexes.  CN 3-12 intact  Skin: Skin is warm and dry.  Calluses on the bottom bilateral feet  Psychiatric: He has a normal mood and affect. His behavior is normal. Judgment and thought content normal.    ED Course   Procedures (including critical care time)  Labs Reviewed  CBC  ACETAMINOPHEN LEVEL  COMPREHENSIVE METABOLIC PANEL  ETHANOL  SALICYLATE LEVEL  URINE RAPID DRUG SCREEN (HOSP PERFORMED)   Results for orders placed during the hospital encounter of 04/16/13  ACETAMINOPHEN LEVEL      Result Value Range  Acetaminophen (Tylenol), Serum <15.0  10 - 30 ug/mL  CBC      Result Value Range   WBC 4.8  4.0 - 10.5 K/uL   RBC 4.35  4.22 - 5.81 MIL/uL   Hemoglobin 14.4  13.0 - 17.0 g/dL   HCT 96.0  45.4 - 09.8 %   MCV 94.9  78.0 - 100.0 fL   MCH 33.1  26.0 - 34.0 pg   MCHC 34.9  30.0 - 36.0 g/dL   RDW 11.9  14.7 - 82.9 %   Platelets 162  150 - 400 K/uL  COMPREHENSIVE METABOLIC PANEL      Result Value Range   Sodium  139  135 - 145 mEq/L   Potassium 3.4 (*) 3.5 - 5.1 mEq/L   Chloride 100  96 - 112 mEq/L   CO2 31  19 - 32 mEq/L   Glucose, Bld 89  70 - 99 mg/dL   BUN 4 (*) 6 - 23 mg/dL   Creatinine, Ser 5.62  0.50 - 1.35 mg/dL   Calcium 9.2  8.4 - 13.0 mg/dL   Total Protein 8.8 (*) 6.0 - 8.3 g/dL   Albumin 3.8  3.5 - 5.2 g/dL   AST 95 (*) 0 - 37 U/L   ALT 37  0 - 53 U/L   Alkaline Phosphatase 115  39 - 117 U/L   Total Bilirubin 0.5  0.3 - 1.2 mg/dL   GFR calc non Af Amer >90  >90 mL/min   GFR calc Af Amer >90  >90 mL/min  ETHANOL      Result Value Range   Alcohol, Ethyl (B) 445 (*) 0 - 11 mg/dL  SALICYLATE LEVEL      Result Value Range   Salicylate Lvl <2.0 (*) 2.8 - 20.0 mg/dL  URINE RAPID DRUG SCREEN (HOSP PERFORMED)      Result Value Range   Opiates NONE DETECTED  NONE DETECTED   Cocaine NONE DETECTED  NONE DETECTED   Benzodiazepines NONE DETECTED  NONE DETECTED   Amphetamines NONE DETECTED  NONE DETECTED   Tetrahydrocannabinol NONE DETECTED  NONE DETECTED   Barbiturates NONE DETECTED  NONE DETECTED   Ct Head Wo Contrast  04/07/2013   *RADIOLOGY REPORT*  Clinical Data: New onset seizure.  CT HEAD WITHOUT CONTRAST  Technique:  Contiguous axial images were obtained from the base of the skull through the vertex without contrast.  Comparison: 02/09/2013.  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  There is smooth erosion of the inner and outer tables. Favor inclusion cyst (such as epidermoid) or large arachnoid granulation.  Orbits: Disconjugate gaze, has been reported previously.  Brain: No evidence of acute abnormality, such as acute infarction, hemorrhage, hydrocephalus, or mass lesion/mass effect.Cerebral and cerebellar atrophy.  IMPRESSION: 1.  No evidence of acute intracranial disease.  2.  Brain atrophy.   Original Report Authenticated By: Tiburcio Pea     No results found. No  diagnosis found.  MDM  Patient requesting detox from alcohol, will discuss the patient with the ACT team. Will place psych hold orders, and move to Pod C.  Roxy Horseman, PA-C 04/17/13 [phone]

## 2013-04-16 NOTE — ED Notes (Signed)
Pt sleeping, no changes, s.o. Called message left on bedside table.

## 2013-04-16 NOTE — ED Notes (Signed)
Pt alert, NAD, calm, interactive, polite, cooperative,  evening meal finished, given warm blanket, watching TV, denies sx or complaints.

## 2013-04-16 NOTE — ED Notes (Signed)
Pt reports he needs detox from alcohol. States his last drink was about 30 minutes ago, and he drank a 40 oz beer. Denies any drug use. No SI/HI. No distress noted.

## 2013-04-17 ENCOUNTER — Encounter (HOSPITAL_COMMUNITY): Payer: Self-pay | Admitting: General Practice

## 2013-04-17 DIAGNOSIS — R112 Nausea with vomiting, unspecified: Secondary | ICD-10-CM | POA: Diagnosis present

## 2013-04-17 DIAGNOSIS — E876 Hypokalemia: Secondary | ICD-10-CM | POA: Diagnosis present

## 2013-04-17 LAB — MAGNESIUM: Magnesium: 1.1 mg/dL — ABNORMAL LOW (ref 1.5–2.5)

## 2013-04-17 LAB — ETHANOL: Alcohol, Ethyl (B): 62 mg/dL — ABNORMAL HIGH (ref 0–11)

## 2013-04-17 MED ORDER — ACETAMINOPHEN 650 MG RE SUPP: 650.0000 mg | Freq: Four times a day (QID) | RECTAL | Status: DC | PRN

## 2013-04-17 MED ORDER — METHOCARBAMOL 500 MG PO TABS
500.0000 mg | ORAL_TABLET | Freq: Four times a day (QID) | ORAL | Status: DC | PRN
  Administered 2013-04-17: 500 mg via ORAL
  Filled 2013-04-17 (×2): qty 1

## 2013-04-17 MED ORDER — LORAZEPAM 1 MG PO TABS
1.0000 mg | ORAL_TABLET | ORAL | Status: DC | PRN
  Administered 2013-04-17: 1 mg via ORAL

## 2013-04-17 MED ORDER — ONDANSETRON 4 MG PO TBDP
4.0000 mg | ORAL_TABLET | Freq: Four times a day (QID) | ORAL | Status: DC | PRN
  Administered 2013-04-17: 4 mg via ORAL
  Filled 2013-04-17 (×2): qty 1

## 2013-04-17 MED ORDER — POTASSIUM CHLORIDE IN NACL 40-0.9 MEQ/L-% IV SOLN
INTRAVENOUS | Status: DC
  Administered 2013-04-17 – 2013-04-18 (×3): via INTRAVENOUS
  Administered 2013-04-18 – 2013-04-19 (×2): 20 mL/h via INTRAVENOUS
  Filled 2013-04-17 (×7): qty 1000

## 2013-04-17 MED ORDER — SODIUM CHLORIDE 0.9 % IJ SOLN
3.0000 mL | Freq: Two times a day (BID) | INTRAMUSCULAR | Status: DC
  Administered 2013-04-17 – 2013-04-19 (×3): 3 mL via INTRAVENOUS

## 2013-04-17 MED ORDER — PROMETHAZINE HCL 25 MG/ML IJ SOLN
12.5000 mg | Freq: Four times a day (QID) | INTRAMUSCULAR | Status: DC | PRN
  Filled 2013-04-17: qty 1

## 2013-04-17 MED ORDER — MAGNESIUM SULFATE 40 MG/ML IJ SOLN
4.0000 g | Freq: Once | INTRAMUSCULAR | Status: AC
  Administered 2013-04-17: 4 g via INTRAVENOUS
  Filled 2013-04-17: qty 100

## 2013-04-17 MED ORDER — SODIUM CHLORIDE 0.9 % IV SOLN: INTRAVENOUS | Status: DC

## 2013-04-17 MED ORDER — SODIUM CHLORIDE 0.9 % IV BOLUS (SEPSIS)
1000.0000 mL | Freq: Once | INTRAVENOUS | Status: AC
  Administered 2013-04-17: 1000 mL via INTRAVENOUS

## 2013-04-17 MED ORDER — ONDANSETRON HCL 4 MG/2ML IJ SOLN: 4.0000 mg | Freq: Three times a day (TID) | INTRAMUSCULAR | Status: DC | PRN

## 2013-04-17 MED ORDER — LORAZEPAM 1 MG PO TABS
2.0000 mg | ORAL_TABLET | Freq: Once | ORAL | Status: DC
  Filled 2013-04-17: qty 2

## 2013-04-17 MED ORDER — PROMETHAZINE HCL 25 MG/ML IJ SOLN
25.0000 mg | Freq: Once | INTRAMUSCULAR | Status: AC
  Administered 2013-04-17: 25 mg via INTRAMUSCULAR
  Filled 2013-04-17: qty 1

## 2013-04-17 MED ORDER — PNEUMOCOCCAL VAC POLYVALENT 25 MCG/0.5ML IJ INJ
0.5000 mL | INJECTION | INTRAMUSCULAR | Status: AC
  Administered 2013-04-18: 0.5 mL via INTRAMUSCULAR
  Filled 2013-04-17: qty 0.5

## 2013-04-17 MED ORDER — LORAZEPAM 2 MG/ML IJ SOLN
1.0000 mg | Freq: Once | INTRAMUSCULAR | Status: AC
  Administered 2013-04-17: 1 mg via INTRAVENOUS
  Filled 2013-04-17: qty 1

## 2013-04-17 MED ORDER — IBUPROFEN 600 MG PO TABS
600.0000 mg | ORAL_TABLET | Freq: Four times a day (QID) | ORAL | Status: DC | PRN
  Filled 2013-04-17: qty 1

## 2013-04-17 MED ORDER — ACETAMINOPHEN 325 MG PO TABS: 650.0000 mg | ORAL_TABLET | Freq: Four times a day (QID) | ORAL | Status: DC | PRN

## 2013-04-17 MED ORDER — LORAZEPAM 2 MG/ML IJ SOLN: 1.0000 mg | Freq: Four times a day (QID) | INTRAMUSCULAR | Status: DC | PRN

## 2013-04-17 MED ORDER — ALUM & MAG HYDROXIDE-SIMETH 200-200-20 MG/5ML PO SUSP: 30.0000 mL | Freq: Four times a day (QID) | ORAL | Status: DC | PRN

## 2013-04-17 NOTE — ED Notes (Signed)
Dr Hyacinth Meeker in to reeval pt . Pt encouraged and agreeable to staying here in ed to get withdrawal symptoms under control.

## 2013-04-17 NOTE — ED Provider Notes (Signed)
52 year old male, history of alcohol abuse, here requesting detox. Initially a blood alcohol level was over 400, he has sobered throughout the day and now is clinically sober, alcohol level is less than 100. The patient is however having significant tremor, he has had withdrawal seizures in the past. He is also very nauseated and not tolerating oral fluids and having difficulty with oral medications. Initially the patient stated that he wanted to pursue outpatient treatment but does this because he knows drinking decubitus symptoms. He is amenable to the hospital after discussion. On my exam he has mild diffuse tremor, he appears mildly diaphoretic, his mental status is at his baseline he is able to make decisions and is now agreeable to admission to the hospital. I then ordered, IV fluids ordered, consultation with hospitalist requested  Vida Roller, MD 04/17/13 1236

## 2013-04-17 NOTE — ED Notes (Signed)
Pt sleeping, NAD, calm, restful.  

## 2013-04-17 NOTE — ED Notes (Signed)
Pt sleeping, restful, no changes.

## 2013-04-17 NOTE — H&P (Signed)
[facility] History and Physical  MADDOX HLAVATY ZOX:[id number] DOB: 09-18-1960 DOA: 04/16/2013  Referring physician: Eber Hong PCP: No PCP Per Patient   Chief Complaint: request detox  HPI: Todd Tran is a 52 y.o. male presents to ED requesting detox from alcohol initial BAL greater than 400. Has been vomiting in ED. Once alcohol level below 100, became very tremulous. Per EDP, h/o ETOH withdrawal seizure. Hospitalists asked to admit for vomiting and alcohol withdrawal. Patient provides little history.  Drinks multiple 40 oz bottles malt liquor daily. Denies drug use. Nausea and tremulousness improved after treatment in ED.  Denies hematemesis.   Review of Systems: Systems review attempted. Wont answers most questions.   Past Medical History  Diagnosis Date  . ETOH abuse   . 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 Hypertension    History reviewed. No pertinent past surgical history. Social History:  reports that he has been smoking Cigarettes.  He has been smoking about 0.25 packs per day. He does not have any smokeless tobacco history on file. He reports that  drinks alcohol. He reports that he does not use illicit drugs.  No Known Allergies  FH:  Alcohol abuse, DM  Prior to Admission medications   Not on File   Physical Exam: Filed Vitals:   04/17/13 1500  BP: 157/85  Pulse: 61  Temp:   Resp:    BP 137/100  Pulse 80  Temp(Src) 99.3 F (37.4 C) (Oral)  Resp 18  Ht 5\' 11"  (1.803 m)  Wt 72.8 kg (160 lb 7.9 oz)  BMI 22.39 kg/m2  SpO2 100%  General Appearance:    Asleep. Arousable. Difficult to understand. Voice quiet. Does not answer questions reliably  Head:    Normocephalic, without obvious abnormality, atraumatic  Eyes:    PERRL, no icterus. Disconjugate gase      Ears:    Normal TM's and external ear canals, both ears  Nose:   Nares normal, septum midline, mucosa normal, no drainage   or  sinus tenderness  Throat:   Lips, mucosa, and tongue normal; teeth and gums normal  Neck:   Supple, symmetrical, trachea midline, no adenopathy;       thyroid:  No enlargement/tenderness/nodules; no carotid   bruit or JVD  Back:     Symmetric, no curvature, ROM normal, no CVA tenderness  Lungs:     Clear to auscultation bilaterally, respirations unlabored     Heart:    Regular rate and rhythm, S1 and S2 normal, no murmur, rub   or gallop  Abdomen:     Soft, non-tender, bowel sounds active all four quadrants,    no masses, no organomegaly  Genitalia:    deferred  Rectal:    deferred  Extremities:   Extremities normal, atraumatic, no cyanosis or edema  Pulses:   2+ and symmetric all extremities  Skin:   Skin color, texture, turgor normal, no rashes or lesions  Lymph nodes:   Cervical, supraclavicular, and axillary nodes normal  Neurologic:   CNII-XII intact. Normal strength, sensation and reflexes      Throughout. No tremor.    Psychiatric: normal affect. cooperative  Labs on Admission:  Basic Metabolic Panel:  Recent Labs Lab 04/14/13 1141 04/16/13 1651 04/17/13 1612  NA 144 139  --   K 3.5 3.4*  --   CL 105 100  --   CO2  --  31  --   GLUCOSE 100* 89  --   BUN <3* 4*  --   CREATININE 1.50* 0.72  --   CALCIUM  --  9.2  --   MG  --   --  1.1*   Liver Function Tests:  Recent Labs Lab 04/16/13 1651  AST 95*  ALT 37  ALKPHOS 115  BILITOT 0.5  PROT 8.8*  ALBUMIN 3.8   No results found for this basename: LIPASE, AMYLASE,  in the last 168 hours No results found for this basename: AMMONIA,  in the last 168 hours CBC:  Recent Labs Lab 04/14/13 1141 04/16/13 1651  WBC  --  4.8  HGB 13.9 14.4  HCT 41.0 41.3  MCV  --  94.9  PLT  --  162    Assessment/Plan Active Problems:   Alcohol dependence: CIWA protocol   Nausea with vomiting: supportive care. Clears, ADAT   Hypokalemia: replete. Check mag   Code Status: full Family Communication: none Disposition  Plan: ?  Time spent: 45 min  Elaysha Bevard L [facility] Pager [phone]  If 7PM-7AM, please contact night-coverage [email or web address] Password [facility] 04/17/2013, 6:28 PM

## 2013-04-17 NOTE — ED Notes (Signed)
Act tele in progress with ava

## 2013-04-17 NOTE — ED Notes (Signed)
Patient has tried to eat lunch but has vomited 2 times. He has a visible tremor to his hands. He is warm and dry.

## 2013-04-17 NOTE — BH Assessment (Addendum)
Assessment Note  Patient is a 52 year old African American male that requests detox from alcohol.  Patient has a BAL 445.  Patient reports that his last drinks 3(40oz) of beer daily.  Patient reports that his last drink was yesterday at 3pm.  Patient reports that his last drink was yesterday.  The patient denies any drug usage.  Patients UDS is negative. Patient reports that he has been addicted to alcohol since he was in his early 20's.    Patient reports inpatient treatment at [facility].  Patient was unable to remember the year in which he received inpatient services.  Patient denies outpatient services for substance abuse.  Patient denies any previous psychiatric inpatient or outpatient mental health services.    Patient acknowledges that his alcohol abuse has caused him to experience seizures.  Patient reports that he has been seeing a physician to address this medical issue.  Patient was not able to remember the name of the physician.  Patient denies any seizures today.  Patient cannot remember when he had his last seizure.  Patient reports withdrawal symptoms.  Patient denies SI/HI.  Patient denies psychosis.      Axis I: Alcohol Abuse Axis II: Deferred Axis III:  Past Medical History  Diagnosis Date  . ETOH abuse   . 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 Hypertension    Axis IV: economic problems, housing problems, occupational problems, other psychosocial or environmental problems, problems related to legal system/crime, problems related to social environment, problems with access to health care services and problems with primary support group Axis V: 41-50 serious symptoms  Past Medical History:  Past Medical History  Diagnosis Date  . ETOH abuse   . 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 Hypertension     History reviewed. No pertinent past surgical  history.  Family History: History reviewed. No pertinent family history.  Social History:  reports that he has been smoking Cigarettes.  He has been smoking about 0.25 packs per day. He does not have any smokeless tobacco history on file. He reports that  drinks alcohol. He reports that he does not use illicit drugs.  Additional Social History:     CIWA: CIWA-Ar BP: 148/94 mmHg Pulse Rate: 74 Nausea and Vomiting: intermittent nausea with dry heaves Tactile Disturbances: none Tremor: two Auditory Disturbances: not present Paroxysmal Sweats: no sweat visible Visual Disturbances: not present Anxiety: mildly anxious Headache, Fullness in Head: none present Agitation: somewhat more than normal activity Orientation and Clouding of Sensorium: oriented and can do serial additions CIWA-Ar Total: 8 COWS:    Allergies: No Known Allergies  Home Medications:  (Not in a hospital admission)  OB/GYN Status:  No LMP for male patient.  General Assessment Data Location of Assessment: [facility] ED Is this a Tele or Face-to-Face Assessment?: Face-to-Face Is this an Initial Assessment or a Re-assessment for this encounter?: Initial Assessment Living Arrangements: Other relatives Can pt return to current living arrangement?: Yes Admission Status: Voluntary Is patient capable of signing voluntary admission?: Yes Transfer from: Acute Hospital Referral Source: Self/Family/Friend  Medical Screening Exam [facility] Walk-in ONLY) Medical Exam completed: Yes  [facility] Crisis Care Plan Living Arrangements: Other relatives  Education Status Is patient currently in school?: No  Risk to self Suicidal Ideation: No Suicidal Intent: No Is patient at  risk for suicide?: No Suicidal Plan?: No Access to Means: No What has been your use of drugs/alcohol within the last 12 months?: alcohol Previous Attempts/Gestures: No How many times?: 0 Other Self Harm Risks: None Reported Triggers for Past Attempts:  Unpredictable Intentional Self Injurious Behavior: None Family Suicide History: No Recent stressful life event(s): Job Loss;Financial Problems;Legal Issues Persecutory voices/beliefs?: No Depression: Yes Depression Symptoms: Loss of interest in usual pleasures;Feeling worthless/self pity;Feeling angry/irritable Substance abuse history and/or treatment for substance abuse?: Yes Suicide prevention information given to non-admitted patients: Not applicable  Risk to Others Homicidal Ideation: No Thoughts of Harm to Others: No Current Homicidal Intent: No Current Homicidal Plan: No Access to Homicidal Means: No Identified Victim: None Reported History of harm to others?: No Assessment of Violence: None Noted Violent Behavior Description: Calm and cooperative Does patient have access to weapons?: No Criminal Charges Pending?: Yes Describe Pending Criminal Charges: Larceny  Does patient have a court date: Yes Court Date: 05/09/13  Psychosis Hallucinations: None noted Delusions: None noted  Mental Status Report Appear/Hygiene: Disheveled Eye Contact: Fair Motor Activity: Freedom of movement Speech: Logical/coherent Level of Consciousness: Quiet/awake Mood: Suspicious;Helpless Affect: Anxious;Irritable Anxiety Level: Minimal Thought Processes: Coherent;Relevant Judgement: Unimpaired Orientation: Person;Place;Time;Situation Obsessive Compulsive Thoughts/Behaviors: None  Cognitive Functioning Concentration: Decreased Memory: Recent Intact;Remote Intact IQ: Average Insight: Poor Impulse Control: Poor Appetite: Fair Weight Loss: 0 Weight Gain: 0 Sleep: No Change Total Hours of Sleep: 8 Vegetative Symptoms: None  ADLScreening [facility] Assessment Services) Patient's cognitive ability adequate to safely complete daily activities?: Yes Patient able to express need for assistance with ADLs?: Yes Independently performs ADLs?: Yes (appropriate for developmental age)  Prior  Inpatient Therapy Prior Inpatient Therapy: Yes Prior Therapy Dates: 2014 Prior Therapy Facilty/Provider(s): [facility] Reason for Treatment: substance abuse  Prior Outpatient Therapy Prior Outpatient Therapy: No Prior Therapy Dates: None Reported Prior Therapy Facilty/Provider(s): None  Reason for Treatment: N/A  ADL Screening (condition at time of admission) Patient's cognitive ability adequate to safely complete daily activities?: Yes Patient able to express need for assistance with ADLs?: Yes Independently performs ADLs?: Yes (appropriate for developmental age)         Values / Beliefs Cultural Requests During Hospitalization: None Spiritual Requests During Hospitalization: None        Additional Information 1:1 In Past 12 Months?: No CIRT Risk: No Elopement Risk: No Does patient have medical clearance?: Yes     Disposition: Discharge after patient receives detox protocol with outpatient referrals. Disposition Initial Assessment Completed for this Encounter: Yes Disposition of Patient: Outpatient treatment Type of outpatient treatment: Chemical Dependence - Intensive Outpatient  On Site Evaluation by:   Reviewed with Physician:    Phillip Heal LaVerne 04/17/2013 9:34 AM

## 2013-04-17 NOTE — Progress Notes (Signed)
Utilization review complete. Willow Shidler RN CCM Case Mgmt phone [phone] 

## 2013-04-17 NOTE — BH Assessment (Signed)
Patient consulted with the nurse working with the patient.  The patient has a CIWA score of 8 and he is now exhibiting an increased level of withdrawal symptoms.  The nurse reports that the patient is beginning to shake.  The nurse informed the writer that the patients BAL has decreased from 455 last night to 65 this morning.    Writer consulted with the ER MD (Dr. Hyacinth Meeker) and informed him that the patient has requested outpatient services instead of inpatient services.  Writer informed the ER MD of the patients increased withdrawal symptoms.

## 2013-04-17 NOTE — ED Notes (Signed)
CIWA score 2: parameters not met, med not given, pt denies sx or complaints, mild tremor noted, BP elevated, calm, NAD, resting/sleeping.

## 2013-04-17 NOTE — ED Provider Notes (Signed)
Medical screening examination/treatment/procedure(s) were performed by non-physician practitioner and as supervising physician I was immediately available for consultation/collaboration.   Shanna Cisco, MD 04/17/13 [phone]

## 2013-04-17 NOTE — ED Notes (Signed)
Pt sleeping, arousable for VS, VSS, pt calm, NAD, alert, interactive, skin W&D, resps e/u, (denies: HA, nausea, shakiness or other sx or complaints).

## 2013-04-18 DIAGNOSIS — F1020 Alcohol dependence, uncomplicated: Secondary | ICD-10-CM

## 2013-04-18 DIAGNOSIS — R112 Nausea with vomiting, unspecified: Secondary | ICD-10-CM

## 2013-04-18 DIAGNOSIS — F10239 Alcohol dependence with withdrawal, unspecified: Principal | ICD-10-CM

## 2013-04-18 DIAGNOSIS — E876 Hypokalemia: Secondary | ICD-10-CM

## 2013-04-18 LAB — BASIC METABOLIC PANEL
Calcium: 8.9 mg/dL (ref 8.4–10.5)
Chloride: 97 mEq/L (ref 96–112)
Creatinine, Ser: 0.72 mg/dL (ref 0.50–1.35)
GFR calc Af Amer: >90 mL/min (ref >90)
Sodium: 132 mEq/L — ABNORMAL LOW (ref 135–145)

## 2013-04-18 NOTE — Clinical Social Work Psychosocial (Signed)
Clinical Social Work Department BRIEF PSYCHOSOCIAL ASSESSMENT 04/18/2013  Patient:  Todd Tran, Todd Tran     Account Number:  [id number]     Admit date:  04/16/2013  Clinical Social Worker:  Lavell Luster  Date/Time:  04/18/2013 11:06 AM  Referred by:  Physician  Date Referred:  04/18/2013 Referred for  Substance Abuse   Other Referral:   Interview type:  Patient Other interview type:    PSYCHOSOCIAL DATA Living Status:  ALONE Admitted from facility:   Level of care:   Primary support name:  Wyvonne Lenz Primary support relationship to patient:  SIBLING Degree of support available:   Patient appears to have few supports.    CURRENT CONCERNS Current Concerns  Substance Abuse   Other Concerns:    SOCIAL WORK ASSESSMENT / PLAN CSW met with patient to briefly discuss patient's alcohol use and treatment options available in the community. Patient states that he would drink 3 to 4 fifths of alcohol a day, then stated that on a typical day he may have 3 or 4 bottles of "Red [place] Rose" to split with someone. Patient has received treatment two times in the past for his alcohol use, once with the [facility], and the other with [facility]. Patient states that he is pretty much on his own but does have sister and a girl that he talks too named "[place]". CSW inquired about patients commitment to seeking treatment after D/C and patient was unsure about whether or not he would follow through with finding treatment. Patient had no questions for CSW and SA resources were left with patient in room.   Assessment/plan status:  No Further Intervention Required Other assessment/ plan:   Information/referral to community resources:   SA community resources and CSW contact information left with patient.    PATIENT'S/FAMILY'S RESPONSE TO PLAN OF CARE: Patient stated that he is "tired of looking stupid" and that it's time to make a change. CSW suspects that patient is still on the fence  about seeking treatment. Patient was pleasant and seemed appreciative of the CSW visit.    Roddie Mc, [place], [place], [phone]

## 2013-04-18 NOTE — Progress Notes (Signed)
Nutrition Brief Note  Malnutrition Screening Tool result is inaccurate.  Please consult if nutrition needs are identified.  Katie Kesley Gaffey, RD, LDN Pager #: [phone] After-Hours Pager #: [phone]  

## 2013-04-18 NOTE — Progress Notes (Signed)
[facility] PROGRESS NOTE  Todd Tran QIO:[id number] DOB: [birth year] DOA: 04/16/2013 PCP: No PCP Per Patient  Assessment/Plan:  Active Problems:   Alcohol dependence   Nausea with vomiting   Hypokalemia   Hypomagnesemia  Continue detox. Advance diet. Correct electrolyte abnormalities.  SW to see  Code Status: full Family Communication: none Disposition Plan: home   Consultants:  SW  HPI/Subjective: Feels a little better. Wants outpatient alcohol treatment once stable to go.  Per RN, no vomiting, but periods of tremulousness  Objective: Filed Vitals:   04/18/13 0617  BP: 127/73  Pulse: 69  Temp: 98.8 F (37.1 C)  Resp: 16    Intake/Output Summary (Last 24 hours) at 04/18/13 0857 Last data filed at 04/18/13 0600  Gross per 24 hour  Intake 1903.75 ml  Output    921 ml  Net 982.75 ml   Filed Weights   04/17/13 1459  Weight: 72.8 kg (160 lb 7.9 oz)    Exam:   General:  More alert. Appropriate, cooperative  Cardiovascular: RRR without MGR  Respiratory: CTA without WRR  Abdomen: S, NT, ND  Musculoskeletal: no CCE. No tremor  Data Reviewed: Basic Metabolic Panel:  Recent Labs Lab 04/14/13 1141 04/16/13 1651 04/17/13 1612 04/18/13 0545  NA 144 139  --  132*  K 3.5 3.4*  --  3.9  CL 105 100  --  97  CO2  --  31  --  25  GLUCOSE 100* 89  --  89  BUN <3* 4*  --  5*  CREATININE 1.50* 0.72  --  0.72  CALCIUM  --  9.2  --  8.9  MG  --   --  1.1*  --    Liver Function Tests:  Recent Labs Lab 04/16/13 1651  AST 95*  ALT 37  ALKPHOS 115  BILITOT 0.5  PROT 8.8*  ALBUMIN 3.8   No results found for this basename: LIPASE, AMYLASE,  in the last 168 hours No results found for this basename: AMMONIA,  in the last 168 hours CBC:  Recent Labs Lab 04/14/13 1141 04/16/13 1651  WBC  --  4.8  HGB 13.9 14.4  HCT 41.0 41.3  MCV  --  94.9  PLT  --  162   Cardiac Enzymes: No results found for this basename: CKTOTAL, CKMB, CKMBINDEX,  TROPONINI,  in the last 168 hours BNP (last 3 results) No results found for this basename: PROBNP,  in the last 8760 hours CBG: No results found for this basename: GLUCAP,  in the last 168 hours  No results found for this or any previous visit (from the past 240 hour(s)).   Studies: No results found.  Scheduled Meds: . LORazepam  0-4 mg Oral Q6H   Followed by  . LORazepam  0-4 mg Oral Q12H  . pneumococcal 23 valent vaccine  0.5 mL Intramuscular Tomorrow-1000  . sodium chloride  3 mL Intravenous Q12H  . thiamine  100 mg Oral Daily   Or  . thiamine  100 mg Intravenous Daily   Continuous Infusions: . 0.9 % NaCl with KCl 40 mEq / L 125 mL/hr at 04/18/13 0758   Time spent: 35 minutes  Royston Bekele L  [facility] Pager [phone]. If 7PM-7AM, please contact night-coverage at [email or web address], password [facility] 04/18/2013, 8:57 AM  LOS: 2 days

## 2013-04-19 LAB — BASIC METABOLIC PANEL
Calcium: 9.5 mg/dL (ref 8.4–10.5)
GFR calc non Af Amer: >90 mL/min (ref >90)
Potassium: 3.6 mEq/L (ref 3.5–5.1)
Sodium: 132 mEq/L — ABNORMAL LOW (ref 135–145)

## 2013-04-19 LAB — MAGNESIUM: Magnesium: 1.3 mg/dL — ABNORMAL LOW (ref 1.5–2.5)

## 2013-04-19 MED ORDER — MAGNESIUM SULFATE 40 MG/ML IJ SOLN
4.0000 g | Freq: Once | INTRAMUSCULAR | Status: AC
  Administered 2013-04-19: 4 g via INTRAVENOUS
  Filled 2013-04-19: qty 100

## 2013-04-19 MED ORDER — POTASSIUM CHLORIDE CRYS ER 20 MEQ PO TBCR
40.0000 meq | EXTENDED_RELEASE_TABLET | Freq: Once | ORAL | Status: AC
  Administered 2013-04-19: 40 meq via ORAL
  Filled 2013-04-19: qty 2

## 2013-04-19 MED ORDER — FOLIC ACID 1 MG PO TABS
1.0000 mg | ORAL_TABLET | Freq: Every day | ORAL | Status: DC
  Administered 2013-04-19 – 2013-04-20 (×2): 1 mg via ORAL
  Filled 2013-04-19 (×2): qty 1

## 2013-04-19 NOTE — Progress Notes (Signed)
[facility] PROGRESS NOTE  Todd Tran AVW:[id number] DOB: [birth year] DOA: 04/16/2013 PCP: No PCP Per Patient  Assessment/Plan: #1 alcohol dependence Clinical improvement on Ativan detox withdrawal protocol. No further nausea or vomiting. Patient tolerating diet. Replete magnesium. Patient has been given information on outpatient resources. Continue thiamine and folic acid.Continue supportive care.  #2 hypokalemia/hypomagnesemia Patient noted to be hypomagnesemic. Likely secondary to GI losses from nausea vomiting.potassium at 3.6 today. Magnesium level at 1.3. Replete magnesium and follow.  #3 elevated BP Likely secondary to problem #1. We'll follow for now. Outpatient followup.  #4 nausea or vomiting Secondary to problem #1. Resolved. Tolerating current diet.  #5 prophylaxis SCDs for DVT prophylaxis.  Code Status: full Family Communication: updated patient no family present. Disposition Plan: home when medically stable after detox protocol.   Consultants:  None  Procedures:  None  Antibiotics:  None  HPI/Subjective: Patient states tremors improved. Tolerating diet. Patient with no further nausea or emesis.  Objective: Filed Vitals:   04/19/13 1535  BP: 153/94  Pulse: 77  Temp: 98.2 F (36.8 C)  Resp: 18    Intake/Output Summary (Last 24 hours) at 04/19/13 1700 Last data filed at 04/19/13 1528  Gross per 24 hour  Intake 740.17 ml  Output   1425 ml  Net -684.83 ml   Filed Weights   04/17/13 1459  Weight: 72.8 kg (160 lb 7.9 oz)    Exam:   General:  NAD  Cardiovascular: RRR  Respiratory: CTAB  Abdomen: Soft/NT/ND/+BS  Musculoskeletal: 4/5 bue STRENGTH, 4/5 BLE strength  Data Reviewed: Basic Metabolic Panel:  Recent Labs Lab 04/14/13 1141 04/16/13 1651 04/17/13 1612 04/18/13 0545 04/19/13 0650  NA 144 139  --  132* 132*  K 3.5 3.4*  --  3.9 3.6  CL 105 100  --  97 97  CO2  --  31  --  25 26  GLUCOSE 100* 89  --  89 106*   BUN <3* 4*  --  5* 8  CREATININE 1.50* 0.72  --  0.72 0.80  CALCIUM  --  9.2  --  8.9 9.5  MG  --   --  1.1*  --  1.3*   Liver Function Tests:  Recent Labs Lab 04/16/13 1651  AST 95*  ALT 37  ALKPHOS 115  BILITOT 0.5  PROT 8.8*  ALBUMIN 3.8   No results found for this basename: LIPASE, AMYLASE,  in the last 168 hours No results found for this basename: AMMONIA,  in the last 168 hours CBC:  Recent Labs Lab 04/14/13 1141 04/16/13 1651  WBC  --  4.8  HGB 13.9 14.4  HCT 41.0 41.3  MCV  --  94.9  PLT  --  162   Cardiac Enzymes: No results found for this basename: CKTOTAL, CKMB, CKMBINDEX, TROPONINI,  in the last 168 hours BNP (last 3 results) No results found for this basename: PROBNP,  in the last 8760 hours CBG: No results found for this basename: GLUCAP,  in the last 168 hours  No results found for this or any previous visit (from the past 240 hour(s)).   Studies: No results found.  Scheduled Meds: . folic acid  1 mg Oral Daily  . LORazepam  0-4 mg Oral Q12H  . sodium chloride  3 mL Intravenous Q12H  . thiamine  100 mg Oral Daily   Continuous Infusions: . 0.9 % NaCl with KCl 40 mEq / L 20 mL/hr (04/18/13 1026)    Principal Problem:  Alcohol dependence Active Problems:   Nausea with vomiting   Hypokalemia   Hypomagnesemia    Time spent: > 35 mins    [facility]  [facility] Pager [phone]. If 7PM-7AM, please contact night-coverage at [email or web address], password [facility] 04/19/2013, 5:00 PM  LOS: 3 days

## 2013-04-20 LAB — BASIC METABOLIC PANEL
Calcium: 9.9 mg/dL (ref 8.4–10.5)
GFR calc Af Amer: >90 mL/min (ref >90)
GFR calc non Af Amer: >90 mL/min (ref >90)
Sodium: 131 mEq/L — ABNORMAL LOW (ref 135–145)

## 2013-04-20 LAB — MAGNESIUM: Magnesium: 1.5 mg/dL (ref 1.5–2.5)

## 2013-04-20 MED ORDER — THIAMINE HCL 100 MG PO TABS: 100.0000 mg | ORAL_TABLET | Freq: Every day | ORAL | Status: DC

## 2013-04-20 MED ORDER — MAGNESIUM SULFATE 40 MG/ML IJ SOLN
4.0000 g | Freq: Once | INTRAMUSCULAR | Status: AC
  Administered 2013-04-20: 4 g via INTRAVENOUS
  Filled 2013-04-20: qty 100

## 2013-04-20 MED ORDER — FOLIC ACID 1 MG PO TABS: 1.0000 mg | ORAL_TABLET | Freq: Every day | ORAL | Status: DC

## 2013-04-20 MED ORDER — MAGNESIUM OXIDE 400 MG PO TABS: 400.0000 mg | ORAL_TABLET | Freq: Two times a day (BID) | ORAL | Status: DC

## 2013-04-20 NOTE — Care Management Note (Signed)
    Page 1 of 1   04/20/2013     3:35:26 PM   CARE MANAGEMENT NOTE 04/20/2013  Patient:  Todd Tran, Todd Tran   Account Number:  [id number]  Date Initiated:  04/17/2013  Documentation initiated by:  [facility]  Subjective/Objective Assessment:   ETOH     Action/Plan:   Anticipated DC Date:  04/20/2013   Anticipated DC Plan:  HOME/SELF CARE      DC Planning Services  CM consult  [facility]-up appt scheduled  [facility]      Choice offered to / List presented to:             Status of service:  Completed, signed off Medicare Important Message given?   (If response is "NO", the following Medicare IM given date fields will be blank) Date Medicare IM given:   Date Additional Medicare IM given:    Discharge Disposition:  HOME/SELF CARE  Per UR Regulation:  Reviewed for med. necessity/level of care/duration of stay  If discussed at Long Length of Stay Meetings, dates discussed:    Comments:  04/20/13 15:33 Letha Cape RN, BSN [phone] patient is for dc today, I made f/u apt for hospital and [facility] for patient gave him paperwork to fill out to take to apt. Also CSW gave patient resources for ETOH and a bus pass. Patient will not have any meds but over the counter meds like folic acide , thiamine , and mag.  No other needs identified.  04/17/2013 1520 CSW referral for detox programs. NCM will continue to follow for any dc needs. Isidoro Donning RN CCM Case Mgmt phone [phone]

## 2013-04-20 NOTE — Progress Notes (Signed)
Todd Tran to be D/C'd Home per MD order.  Discussed with the patient and all questions fully answered.    Medication List         folic acid 1 MG tablet  Commonly known as:  FOLVITE  Take 1 tablet (1 mg total) by mouth daily.     magnesium oxide 400 MG tablet  Commonly known as:  MAG-OX  Take 1 tablet (400 mg total) by mouth 2 (two) times daily.     thiamine 100 MG tablet  Take 1 tablet (100 mg total) by mouth daily.        VVS, Skin clean, dry and intact without evidence of skin break down, no evidence of skin tears noted. IV catheter discontinued intact. Site without signs and symptoms of complications. Dressing and pressure applied.  An After Visit Summary was printed and given to the patient. Follow up appointments , new prescriptions and medication administration times given Patient escorted via WC, and D/C home via private auto.  Cindra Eves, RN 04/20/2013 5:45 PM

## 2013-04-20 NOTE — Discharge Summary (Signed)
Physician Discharge Summary  Todd Tran JXB:[id number] DOB: 22-Oct-1960 DOA: 04/16/2013  PCP: No PCP Per Patient  Admit date: 04/16/2013 Discharge date: 04/20/2013  Time spent: 65 minutes  Recommendations for Outpatient Follow-up:  1. Patient to follow up with PCP on 05/03/13. Patient will need BMET and magnesium checked on follow up. 2. Patient has been given information for outpatient alcohol dependence resources  Discharge Diagnoses:  Principal Problem:   Alcohol dependence Active Problems:   Nausea with vomiting   Hypokalemia   Hypomagnesemia   Discharge Condition: Stable  Diet recommendation: Regular  Filed Weights   04/17/13 1459  Weight: 72.8 kg (160 lb 7.9 oz)    History of present illness:  Todd Tran is a 52 y.o. male presents to ED requesting detox from alcohol initial BAL greater than 400. Has been vomiting in ED. Once alcohol level below 100, became very tremulous. Per EDP, h/o ETOH withdrawal seizure. Hospitalists asked to admit for vomiting and alcohol withdrawal. Patient provides little history. Drinks multiple 40 oz bottles malt liquor daily. Denies drug use. Nausea and tremulousness improved after treatment in ED. Denies hematemesis.    Hospital Course:  #1 alcohol dependence  Patient presented to the ED requesting detox from his alcohol level which was greater than 400. Patient had an episode of emesis. Patient was admitted placed on Ativan withdrawal protocol and monitored. Once patient's alcohol level cut below 100 she became very tremulous. Patient was initially placed on clear liquids and followed. Patient improved clinically did not have any further emesis or nausea and was started on a diet. Patient's diet was advanced which she tolerated. Patient improved daily and will have finished a five-day course of the Ativan detox withdrawal protocol. Patient be discharged in stable and improved condition. Patient has been given information consider outpatient  resources. Patient is also to followup with PCP as outpatient. Patient's electrolytes were repleted and patient was maintained on folic acid and thiamine. Patient was discharged in stable and improved condition.  #2 hypokalemia/hypomagnesemia  On admission initially patient was noted to be hypokalemic with a potassium of 3.4 and felt to likely be secondary to GI losses and alcohol abuse. A magnesium level was checked in patient's magnesium level was 1.1. Patient's magnesium has been repleted as well as his potassium. Patient be discharged on magnesium oxide 400 mg twice daily. Patient will followup with PCP as outpatient in a basic metabolic profile as well as a magnesium levels will need to be obtained to followup on patient's electrolytes. #3 elevated BP  Likely secondary to problem #1. Outpatient followup.  #4 nausea or vomiting  Secondary to problem #1. Resolved. Tolerating current diet.      Procedures:  None  Consultations:  None  Discharge Exam: Filed Vitals:   04/20/13 1250  BP: 148/88  Pulse: 69  Temp: 98.1 F (36.7 C)  Resp: 20    General: NAD Cardiovascular: RRR Respiratory: CTAB  Discharge Instructions      Discharge Orders   Future Appointments Provider Department Dept Phone   05/03/2013 4:30 PM [facility] Financial Counselor [facility] [phone]   05/03/2013 5:15 PM [facility] Covering Provider [facility] [phone]   Future Orders Complete By Expires   Diet general  As directed    Discharge instructions  As directed    Comments:     Follow up with PCP in 1 week. Follow up with outpatient detox resources given. No drinking   Increase activity  slowly  As directed        Medication List         folic acid 1 MG tablet  Commonly known as:  FOLVITE  Take 1 tablet (1 mg total) by mouth daily.     magnesium oxide 400 MG tablet  Commonly known as:  MAG-OX  Take 1 tablet (400 mg total) by  mouth 2 (two) times daily.     thiamine 100 MG tablet  Take 1 tablet (100 mg total) by mouth daily.       No Known Allergies Follow-up Information   Follow up with [facility]     On 05/03/2013. (5:15 for hos f/u , please bring photo id , $20 co pay, andy medications you are taking)    Contact information:   [address] [place] [address]       Follow up with [facility]     On 05/03/2013. (4:30 for orange card, please bring completed paperwork)    Contact information:   [address] [place] [address]        The results of significant diagnostics from this hospitalization (including imaging, microbiology, ancillary and laboratory) are listed below for reference.    Significant Diagnostic Studies: Ct Head Wo Contrast  04/07/2013   *RADIOLOGY REPORT*  Clinical Data: New onset seizure.  CT HEAD WITHOUT CONTRAST  Technique:  Contiguous axial images were obtained from the base of the skull through the vertex without contrast.  Comparison: 02/09/2013.  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  There is smooth erosion of the inner and outer tables. Favor inclusion cyst (such as epidermoid) or large arachnoid granulation.  Orbits: Disconjugate gaze, has been reported previously.  Brain: No evidence of acute abnormality, such as acute infarction, hemorrhage, hydrocephalus, or mass lesion/mass effect.Cerebral and cerebellar atrophy.  IMPRESSION: 1.  No evidence of acute intracranial disease.  2.  Brain atrophy.   Original Report Authenticated By: Tiburcio Pea    Microbiology: No results found for this or any previous visit (from the past 240 hour(s)).   Labs: Basic Metabolic Panel:  Recent Labs Lab 04/14/13 1141 04/16/13 1651 04/17/13 1612 04/18/13 0545 04/19/13 0650 04/20/13 0526  NA 144  139  --  132* 132* 131*  K 3.5 3.4*  --  3.9 3.6 4.2  CL 105 100  --  97 97 95*  CO2  --  31  --  25 26 27   GLUCOSE 100* 89  --  89 106* 91  BUN <3* 4*  --  5* 8 10  CREATININE 1.50* 0.72  --  0.72 0.80 0.89  CALCIUM  --  9.2  --  8.9 9.5 9.9  MG  --   --  1.1*  --  1.3* 1.5   Liver Function Tests:  Recent Labs Lab 04/16/13 1651  AST 95*  ALT 37  ALKPHOS 115  BILITOT 0.5  PROT 8.8*  ALBUMIN 3.8   No results found for this basename: LIPASE, AMYLASE,  in the last 168 hours No results found for this basename: AMMONIA,  in the last 168 hours CBC:  Recent Labs Lab 04/14/13 1141 04/16/13 1651  WBC  --  4.8  HGB 13.9 14.4  HCT 41.0 41.3  MCV  --  94.9  PLT  --  162  Cardiac Enzymes: No results found for this basename: CKTOTAL, CKMB, CKMBINDEX, TROPONINI,  in the last 168 hours BNP: BNP (last 3 results) No results found for this basename: PROBNP,  in the last 8760 hours CBG: No results found for this basename: GLUCAP,  in the last 168 hours     Signed:  [facility] 04/20/2013, 5:34 PM

## 2013-05-03 ENCOUNTER — Encounter: Payer: Self-pay | Admitting: Cardiology

## 2013-05-03 ENCOUNTER — Ambulatory Visit: Payer: Self-pay | Attending: Cardiology | Admitting: Cardiology

## 2013-05-03 ENCOUNTER — Ambulatory Visit: Payer: Self-pay

## 2013-05-03 VITALS — BP 142/99 | HR 106 | Temp 97.8°F | Resp 16 | Wt 165.8 lb

## 2013-05-03 DIAGNOSIS — E860 Dehydration: Secondary | ICD-10-CM | POA: Insufficient documentation

## 2013-05-03 DIAGNOSIS — F10129 Alcohol abuse with intoxication, unspecified: Secondary | ICD-10-CM

## 2013-05-03 DIAGNOSIS — E876 Hypokalemia: Secondary | ICD-10-CM | POA: Insufficient documentation

## 2013-05-03 DIAGNOSIS — F10229 Alcohol dependence with intoxication, unspecified: Secondary | ICD-10-CM | POA: Insufficient documentation

## 2013-05-03 NOTE — Assessment & Plan Note (Signed)
I have referred to our social worker here in our clinic. We will offer her help for alcohol withdrawal and rehabilitation. His blood pressures elevated today but I do not feel comfortable treating him until he is sober. I've scheduled for followup with me in 4 weeks.

## 2013-05-03 NOTE — Progress Notes (Signed)
Patient here to establish care Needs help with his drinking problem [facility] sent him to [place] for primary dr

## 2013-05-03 NOTE — Progress Notes (Signed)
Todd Tran is in for followup after being discharged from the hospital with alcoholism, dehydration with hypokalemia and hypomagnesemia.  He is intoxicated today. He has not filled any of his medications. He says he does want some help with alcohol.  Exam is unchanged.

## 2013-05-24 ENCOUNTER — Encounter (HOSPITAL_COMMUNITY): Payer: Self-pay

## 2013-05-24 ENCOUNTER — Emergency Department (HOSPITAL_COMMUNITY): Payer: Self-pay

## 2013-05-24 ENCOUNTER — Emergency Department (HOSPITAL_COMMUNITY)
Admission: EM | Admit: 2013-05-24 | Discharge: 2013-05-24 | Disposition: A | Discharge status: Home / Self Care | Payer: Self-pay | Attending: Emergency Medicine | Admitting: Emergency Medicine

## 2013-05-24 DIAGNOSIS — Q159 Congenital malformation of eye, unspecified: Secondary | ICD-10-CM | POA: Insufficient documentation

## 2013-05-24 DIAGNOSIS — S298XXA Other specified injuries of thorax, initial encounter: Secondary | ICD-10-CM | POA: Insufficient documentation

## 2013-05-24 DIAGNOSIS — Y9301 Activity, walking, marching and hiking: Secondary | ICD-10-CM | POA: Insufficient documentation

## 2013-05-24 DIAGNOSIS — F1010 Alcohol abuse, uncomplicated: Secondary | ICD-10-CM | POA: Insufficient documentation

## 2013-05-24 DIAGNOSIS — Z87891 Personal history of nicotine dependence: Secondary | ICD-10-CM | POA: Insufficient documentation

## 2013-05-24 DIAGNOSIS — Y9289 Other specified places as the place of occurrence of the external cause: Secondary | ICD-10-CM | POA: Insufficient documentation

## 2013-05-24 DIAGNOSIS — R296 Repeated falls: Secondary | ICD-10-CM | POA: Insufficient documentation

## 2013-05-24 DIAGNOSIS — F10929 Alcohol use, unspecified with intoxication, unspecified: Secondary | ICD-10-CM

## 2013-05-24 DIAGNOSIS — R000 Tachycardia, unspecified: Secondary | ICD-10-CM | POA: Insufficient documentation

## 2013-05-24 DIAGNOSIS — Z79899 Other long term (current) drug therapy: Secondary | ICD-10-CM | POA: Insufficient documentation

## 2013-05-24 DIAGNOSIS — I10 Essential (primary) hypertension: Secondary | ICD-10-CM | POA: Insufficient documentation

## 2013-05-24 DIAGNOSIS — S8990XA Unspecified injury of unspecified lower leg, initial encounter: Secondary | ICD-10-CM | POA: Insufficient documentation

## 2013-05-24 DIAGNOSIS — S8992XA Unspecified injury of left lower leg, initial encounter: Secondary | ICD-10-CM

## 2013-05-24 LAB — COMPREHENSIVE METABOLIC PANEL
AST: 83 U/L — ABNORMAL HIGH (ref 0–37)
Albumin: 3.5 g/dL (ref 3.5–5.2)
Alkaline Phosphatase: 119 U/L — ABNORMAL HIGH (ref 39–117)
BUN: 5 mg/dL — ABNORMAL LOW (ref 6–23)
CO2: 28 mEq/L (ref 19–32)
Calcium: 9 mg/dL (ref 8.4–10.5)
Chloride: 104 mEq/L (ref 96–112)
Creatinine, Ser: 0.76 mg/dL (ref 0.50–1.35)
GFR calc non Af Amer: >90 mL/min (ref >90)
Potassium: 4.2 mEq/L (ref 3.5–5.1)
Sodium: 144 mEq/L (ref 135–145)
Total Bilirubin: 0.4 mg/dL (ref 0.3–1.2)
Total Protein: 8.3 g/dL (ref 6.0–8.3)

## 2013-05-24 LAB — CBC WITH DIFFERENTIAL/PLATELET
Basophils Relative: 1 % (ref 0–1)
Eosinophils Absolute: 0.2 10*3/uL (ref 0.0–0.7)
Eosinophils Relative: 3 % (ref 0–5)
HCT: 41.9 % (ref 39.0–52.0)
Hemoglobin: 14.1 g/dL (ref 13.0–17.0)
Lymphocytes Relative: 40 % (ref 12–46)
MCH: 31.6 pg (ref 26.0–34.0)
Monocytes Absolute: 0.5 10*3/uL (ref 0.1–1.0)
Monocytes Relative: 9 % (ref 3–12)
Neutro Abs: 2.4 10*3/uL (ref 1.7–7.7)
Neutrophils Relative %: 46 % (ref 43–77)
RBC: 4.46 MIL/uL (ref 4.22–5.81)
WBC: 5.3 10*3/uL (ref 4.0–10.5)

## 2013-05-24 LAB — RAPID URINE DRUG SCREEN, HOSP PERFORMED
Amphetamines: NOT DETECTED
Barbiturates: NOT DETECTED
Cocaine: NOT DETECTED
Opiates: NOT DETECTED
Tetrahydrocannabinol: NOT DETECTED

## 2013-05-24 MED ORDER — ACETAMINOPHEN 325 MG PO TABS
650.0000 mg | ORAL_TABLET | Freq: Once | ORAL | Status: AC
  Administered 2013-05-24: 650 mg via ORAL
  Filled 2013-05-24: qty 2

## 2013-05-24 NOTE — ED Notes (Signed)
He states he "fell into a tree".  He give a varying list and location of complaints, first saying he hurt in his right leg; most recently he c/o back and abd. Pain.  He ambulates without difficulty.  He smells of ETOH, and is acting as if he is quite intoxicated.

## 2013-05-24 NOTE — ED Notes (Signed)
Pt observed ambulating in the hall to the restroom without difficulty. Greta Doom, PA made aware.

## 2013-05-24 NOTE — ED Provider Notes (Signed)
CSN: [id number]     Arrival date & time 05/24/13  1236 History   First MD Initiated Contact with Patient 05/24/13 1504     Chief Complaint  Patient presents with  . Alcohol Intoxication   (Consider location/radiation/quality/duration/timing/severity/associated sxs/prior Treatment) HPI  52 year old male with history of alcohol abuse brought to the ER with multiple complaints. Patient arrived via EMS. Patient states while walking earlier today his left knee gave out causing him to fall against a tree. He did not recall hitting his head or loss of consciousness. He currently complaining of pain to his left side of body and his left knee. He did not think he has broken his knee. He admits to having history of alcohol abuse admits to drinking alcohol today "I share 1L of alcohol with some friends".  He also reports that he has history of arthritis and his leg would give out on occasion.  Currently denies SI/HI.  Does request for help with his alcohol abuse.  Denies SOB, productive cough, hemoptysis, numbness or weakness.  History is limited as pt appears to be intoxicated.     Past Medical History  Diagnosis Date  . ETOH abuse   . 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 Hypertension   . Alcohol related seizure     h/o withdrawal sz/notes 04/17/2013   Past Surgical History  Procedure Laterality Date  . No past surgeries     History reviewed. No pertinent family history. History  Substance Use Topics  . Smoking status: Former Smoker -- 0.50 packs/day for 30 years    Types: Cigarettes    Quit date: 01/22/2013  . Smokeless tobacco: Never Used  . Alcohol Use: Yes     Comment: 04/17/2013 "2 1/5th/day shared w/4-5 people"    Review of Systems  Constitutional: Negative for fever.  Respiratory: Negative for shortness of breath.   Cardiovascular: Positive for chest pain.  Musculoskeletal: Positive for myalgias and arthralgias.   Neurological: Negative for seizures and headaches.  All other systems reviewed and are negative.    Allergies  Review of patient's allergies indicates no known allergies.  Home Medications   Current Outpatient Rx  Name  Route  Sig  Dispense  Refill  . folic acid (FOLVITE) 1 MG tablet   Oral   Take 1 tablet (1 mg total) by mouth daily.         . magnesium oxide (MAG-OX) 400 MG tablet   Oral   Take 1 tablet (400 mg total) by mouth 2 (two) times daily.   60 tablet   0   . thiamine 100 MG tablet   Oral   Take 1 tablet (100 mg total) by mouth daily.          BP 125/77  Pulse 104  Temp(Src) 98.8 F (37.1 C) (Oral)  Resp 16  SpO2 98% Physical Exam  Nursing note and vitals reviewed. Constitutional: He appears well-developed and well-nourished.  Intoxicated, breath smell of EtOH.  Nontoxic and in NAD  HENT:  Head: Normocephalic and atraumatic.  Eyes:  trabismus  Neck: Normal range of motion. Neck supple.  Cardiovascular:  Mild tachycardia, no M/R/G  Pulmonary/Chest: Effort normal and breath sounds normal. He exhibits tenderness (mild tenderness to L lateral chest wall without paradoxical movement, crepitus or emphysema.  No overlying skin changes.  ).  Abdominal: Soft. There is no tenderness.  Musculoskeletal: He exhibits tenderness (mild  tenderness to L knee without deformity, normal flexion/extension.  Normal L hip and L ankle ROM.  ). He exhibits no edema.  Neurological: He is alert. GCS eye subscore is 4. GCS verbal subscore is 5. GCS motor subscore is 6.  Pt able to answer basic questions, but does slurred his speech, poor memory.    Finger to nose, heel to shin with sluggish movement, and poor coordination.  Antalgic gait, unsteady on feet.  Patella DTR 2+ bilat.      ED Course  Procedures (including critical care time)  3:24 PM Pt is intoxicated, had a low impact fall without obvious injury on exam.  Does request for alcohol detox.  Will perform  medical screening.    4:58 PM Patient's alcohol level is 420, this is his baseline when comparing to prior values. Patient will need to stay in the ER until he is clinically sober. Pt will need on have detox outpatient. X-ray of left knee shows no acute fractures or dislocation. Care discussed with attending.  8:33 PM Pt is able to ambulate, is more sober.  Does have family member to pick him up.  Pt to f/u outpt for alcohol management.    Labs Review Labs Reviewed  CBC WITH DIFFERENTIAL - Abnormal; Notable for the following:    Platelets 134 (*)    All other components within normal limits  COMPREHENSIVE METABOLIC PANEL - Abnormal; Notable for the following:    BUN 5 (*)    AST 83 (*)    Alkaline Phosphatase 119 (*)    All other components within normal limits  ETHANOL - Abnormal; Notable for the following:    Alcohol, Ethyl (B) 420 (*)    All other components within normal limits  URINE RAPID DRUG SCREEN (HOSP PERFORMED)   Imaging Review Dg Knee Complete 4 Views Left  05/24/2013   *RADIOLOGY REPORT*  Clinical Data: Post fall, now with left knee pain, initial encounter.  LEFT KNEE - COMPLETE 4+ VIEW  Comparison: 02/23/2013; 06/26/2011  Findings:  No fracture or dislocation.  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  No joint effusion.  Joint spaces are preserved.  No evidence of chondrocalcinosis.  There is minimal osteophytosis about the tibial tuberosity, unchanged.  Vascular calcifications. No radiopaque foreign body.  IMPRESSION: 1.  No acute findings. 2.  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 3.  Osteophytosis about the tibial tuberosity, presumably sequelae of Osgood-Schlatter disease.   Original Report Authenticated By: Tacey Ruiz, MD    MDM   1. Alcohol intoxication   2. Left knee injury, initial encounter    BP 135/90  Pulse 100  Temp(Src) 97.8 F (36.6 C) (Oral)  Resp 16  SpO2 98%  I have reviewed nursing notes and vital signs. I personally reviewed the  imaging tests through PACS system  I reviewed available ER/hospitalization records thought the EMR     Fayrene Helper, [place] 05/24/13 2035

## 2013-05-24 NOTE — Progress Notes (Signed)
[facility] provided pt with a [facility] Orange Card application, highlighting [facility].  °

## 2013-05-24 NOTE — ED Notes (Signed)
When giving the pt tylenol, pt was still unsteady and therefore did not attempted to ambulate.  Greta Doom, PA made aware.

## 2013-05-24 NOTE — ED Notes (Signed)
Per EMS: Pt drank a liter of wine, lost his balance, fell against a tree on the left side.  States his leg gave out d/t arthritis.  C/o lt leg pain.

## 2013-05-24 NOTE — ED Provider Notes (Signed)
  Medical screening examination/treatment/procedure(s) were performed by non-physician practitioner and as supervising physician I was immediately available for consultation/collaboration.    Gerhard Munch, MD 05/24/13 [phone]

## 2013-05-28 ENCOUNTER — Emergency Department (HOSPITAL_COMMUNITY)
Admission: EM | Admit: 2013-05-28 | Discharge: 2013-05-28 | Disposition: A | Discharge status: Home / Self Care | Payer: Self-pay | Attending: Emergency Medicine | Admitting: Emergency Medicine

## 2013-05-28 ENCOUNTER — Encounter (HOSPITAL_COMMUNITY): Payer: Self-pay | Admitting: Emergency Medicine

## 2013-05-28 DIAGNOSIS — G40909 Epilepsy, unspecified, not intractable, without status epilepticus: Secondary | ICD-10-CM | POA: Insufficient documentation

## 2013-05-28 DIAGNOSIS — I10 Essential (primary) hypertension: Secondary | ICD-10-CM | POA: Insufficient documentation

## 2013-05-28 DIAGNOSIS — R569 Unspecified convulsions: Secondary | ICD-10-CM

## 2013-05-28 DIAGNOSIS — Z87891 Personal history of nicotine dependence: Secondary | ICD-10-CM | POA: Insufficient documentation

## 2013-05-28 LAB — CBC WITH DIFFERENTIAL/PLATELET
Basophils Absolute: 0.1 10*3/uL (ref 0.0–0.1)
Eosinophils Relative: 4 % (ref 0–5)
Lymphocytes Relative: 42 % (ref 12–46)
MCV: 93.1 fL (ref 78.0–100.0)
Monocytes Relative: 9 % (ref 3–12)
Platelets: 101 10*3/uL — ABNORMAL LOW (ref 150–400)
RBC: 4.18 MIL/uL — ABNORMAL LOW (ref 4.22–5.81)
RDW: 14.1 % (ref 11.5–15.5)
WBC: 5 10*3/uL (ref 4.0–10.5)

## 2013-05-28 LAB — COMPREHENSIVE METABOLIC PANEL
ALT: 31 U/L (ref 0–53)
AST: 80 U/L — ABNORMAL HIGH (ref 0–37)
Albumin: 3.5 g/dL (ref 3.5–5.2)
Alkaline Phosphatase: 105 U/L (ref 39–117)
CO2: 27 mEq/L (ref 19–32)
Chloride: 99 mEq/L (ref 96–112)
Creatinine, Ser: 0.78 mg/dL (ref 0.50–1.35)
GFR calc non Af Amer: >90 mL/min (ref >90)
Potassium: 3.5 mEq/L (ref 3.5–5.1)
Sodium: 138 mEq/L (ref 135–145)
Total Bilirubin: 0.5 mg/dL (ref 0.3–1.2)
Total Protein: 7.8 g/dL (ref 6.0–8.3)

## 2013-05-28 LAB — PHENYTOIN LEVEL, TOTAL: Phenytoin Lvl: 8.1 ug/mL — ABNORMAL LOW (ref 10.0–20.0)

## 2013-05-28 LAB — ETHANOL: Alcohol, Ethyl (B): 345 mg/dL — ABNORMAL HIGH (ref 0–11)

## 2013-05-28 MED ORDER — PHENYTOIN SODIUM EXTENDED 100 MG PO CAPS: 300.0000 mg | ORAL_CAPSULE | Freq: Every day | ORAL | Status: DC

## 2013-05-28 MED ORDER — LORAZEPAM 1 MG PO TABS: 0.0000 mg | ORAL_TABLET | Freq: Two times a day (BID) | ORAL | Status: DC

## 2013-05-28 MED ORDER — LORAZEPAM 2 MG/ML IJ SOLN: 0.0000 mg | Freq: Two times a day (BID) | INTRAMUSCULAR | Status: DC

## 2013-05-28 MED ORDER — LORAZEPAM 2 MG/ML IJ SOLN: 0.0000 mg | Freq: Four times a day (QID) | INTRAMUSCULAR | Status: DC

## 2013-05-28 MED ORDER — PHENYTOIN SODIUM EXTENDED 100 MG PO CAPS
400.0000 mg | ORAL_CAPSULE | Freq: Once | ORAL | Status: AC
  Administered 2013-05-28: 400 mg via ORAL
  Filled 2013-05-28: qty 4

## 2013-05-28 MED ORDER — THIAMINE HCL 100 MG/ML IJ SOLN: 100.0000 mg | Freq: Every day | INTRAMUSCULAR | Status: DC

## 2013-05-28 MED ORDER — VITAMIN B-1 100 MG PO TABS
100.0000 mg | ORAL_TABLET | Freq: Every day | ORAL | Status: DC
  Administered 2013-05-28: 20:00:00 100 mg via ORAL
  Filled 2013-05-28: qty 1

## 2013-05-28 MED ORDER — LORAZEPAM 1 MG PO TABS: 0.0000 mg | ORAL_TABLET | Freq: Four times a day (QID) | ORAL | Status: DC

## 2013-05-28 NOTE — ED Provider Notes (Signed)
CSN: [id number]     Arrival date & time 05/28/13  1603 History   First MD Initiated Contact with Patient 05/28/13 1626     Chief Complaint  Patient presents with  . Seizures   (Consider location/radiation/quality/duration/timing/severity/associated sxs/prior Treatment) HPI Comments: Patient is a 52 year old male with history of seizures who presents today after having a possible seizure. Patient is a poor historian and reports that he only remembers getting into the ambulance today. The EMS report notes that he was drinking with his friend, walking [facility] when he had a seizure. Patient was not post ictal and is alert and oriented x 4. He was at [facility], last night after having a seizure. He reports that they got his name and birthdate wrong and so he was unable to pick up his Dilantin this morning. Patient reports that he has Epilepsy, but "has never taken Dilantin in his life". Per Dr. Fonnie Jarvis, who saw him last night the patient received 800mg  in the ED last night. Patient denies any recent illness, shortness of breath, chest pain, fever, chills, cough.   Patient is a 52 y.o. male presenting with seizures. The history is provided by the patient. No language interpreter was used.  Seizures   Past Medical History  Diagnosis Date  . ETOH abuse   . 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 Hypertension   . Alcohol related seizure     h/o withdrawal sz/notes 04/17/2013   Past Surgical History  Procedure Laterality Date  . No past surgeries     History reviewed. No pertinent family history. History  Substance Use Topics  . Smoking status: Former Smoker -- 0.50 packs/day for 30 years    Types: Cigarettes    Quit date: 01/22/2013  . Smokeless tobacco: Never Used  . Alcohol Use: Yes     Comment: 04/17/2013 "2 1/5th/day shared w/4-5 people"    Review of Systems  Constitutional: Negative for fever and chills.   Respiratory: Negative for cough and shortness of breath.   Cardiovascular: Negative for chest pain.  Gastrointestinal: Negative for nausea, vomiting and abdominal pain.  Neurological: Positive for seizures.  All other systems reviewed and are negative.    Allergies  Review of patient's allergies indicates no known allergies.  Home Medications  No current outpatient prescriptions on file. BP 136/94  Pulse 100  Temp(Src) 98.4 F (36.9 C) (Oral)  Resp 19  SpO2 100% Physical Exam  Nursing note and vitals reviewed. Constitutional: He is oriented to person, place, and time. He appears well-developed and well-nourished. No distress.  HENT:  Head: Normocephalic and atraumatic.  Right Ear: External ear normal.  Left Ear: External ear normal.  Nose: Nose normal.  Eyes: Conjunctivae are normal. Pupils are equal, round, and reactive to light. Right eye exhibits abnormal extraocular motion (eye chronically stays in lateral gaze).  strabismus  Neck: Normal range of motion. No tracheal deviation present.  Cardiovascular: Normal rate, regular rhythm and normal heart sounds.   Pulmonary/Chest: Effort normal and breath sounds normal. No stridor.  Abdominal: Soft. He exhibits no distension. There is no tenderness.  Musculoskeletal: Normal range of motion.  Neurological: He is alert and oriented to person, place, and time. He has normal strength. No sensory deficit. Coordination normal.  Finger nose finger normal  Skin: Skin is warm and dry. He is not diaphoretic.  Psychiatric: He has a normal mood and affect. His  behavior is normal.    ED Course  Procedures (including critical care time) Labs Review Labs Reviewed  PHENYTOIN LEVEL, TOTAL - Abnormal; Notable for the following:    Phenytoin Lvl 8.1 (*)    All other components within normal limits  CBC WITH DIFFERENTIAL - Abnormal; Notable for the following:    RBC 4.18 (*)    HCT 38.9 (*)    Platelets 101 (*)    All other components  within normal limits  COMPREHENSIVE METABOLIC PANEL - Abnormal; Notable for the following:    BUN 4 (*)    AST 80 (*)    All other components within normal limits  ETHANOL - Abnormal; Notable for the following:    Alcohol, Ethyl (B) 345 (*)    All other components within normal limits   Imaging Review No results found.  MDM   1. Seizure    Patient presents today after having a possible unwitnessed seizure. Per EMS he was not postictal. He has a prior history of seizures. Dilantin level is subtherapeutic. He was given 400 mg of Dilantin in the emergency department. He was given a prescription for Dilantin. I discussed with the patient that I want him to take 200 mg tomorrow morning when he gets the prescription filled and then take 300 mg every night. The patient expressed understanding and reports that he will get his medication filled. He was clinically sober with a safe ride home prior to discharge. I discussed this case with Dr. Fonnie Jarvis who saw the patient last night for the same complaint. He agrees with plan. Return instructions given. Vital signs stable for discharge. Patient / Family / Caregiver informed of clinical course, understand medical decision-making process, and agree with plan.   Medications  LORazepam (ATIVAN) injection 0-4 mg (0 mg Intravenous Not Given 05/28/13 1930)    Followed by  LORazepam (ATIVAN) injection 0-4 mg (not administered)  LORazepam (ATIVAN) tablet 0-4 mg (0 mg Oral Not Given 05/28/13 1929)    Followed by  LORazepam (ATIVAN) tablet 0-4 mg (not administered)  thiamine (VITAMIN B-1) tablet 100 mg (100 mg Oral Given 05/28/13 1938)    Or  thiamine (B-1) injection 100 mg ( Intravenous See Alternative 05/28/13 1938)  phenytoin (DILANTIN) ER capsule 400 mg (400 mg Oral Given 05/28/13 2216)       Mora Bellman, PA-C 05/29/13 0125

## 2013-05-28 NOTE — ED Notes (Signed)
Bed: WA08 Expected date:  Expected time:  Means of arrival:  Comments: seizure 

## 2013-05-28 NOTE — ED Notes (Signed)
Per EMS: Pt was walking home from [facility] with a friend and drinking when he had a seizure. No post-ictal symptoms. A&O x 4. Pt has prescription for Dilantin from [facility] today.

## 2013-05-31 ENCOUNTER — Ambulatory Visit: Payer: Self-pay

## 2013-06-01 NOTE — ED Provider Notes (Signed)
Medical screening examination/treatment/procedure(s) were performed by non-physician practitioner and as supervising physician I was immediately available for consultation/collaboration.  Lunabelle Oatley M Tomesha Sargent, MD 06/01/13 2129 

## 2013-06-15 ENCOUNTER — Encounter (HOSPITAL_COMMUNITY): Payer: Self-pay | Admitting: Emergency Medicine

## 2013-06-15 ENCOUNTER — Emergency Department (HOSPITAL_COMMUNITY)
Admission: EM | Admit: 2013-06-15 | Discharge: 2013-06-15 | Disposition: A | Discharge status: Home / Self Care | Payer: Self-pay | Attending: Emergency Medicine | Admitting: Emergency Medicine

## 2013-06-15 DIAGNOSIS — F445 Conversion disorder with seizures or convulsions: Secondary | ICD-10-CM

## 2013-06-15 DIAGNOSIS — G40909 Epilepsy, unspecified, not intractable, without status epilepticus: Secondary | ICD-10-CM | POA: Insufficient documentation

## 2013-06-15 DIAGNOSIS — Z79899 Other long term (current) drug therapy: Secondary | ICD-10-CM | POA: Insufficient documentation

## 2013-06-15 DIAGNOSIS — I10 Essential (primary) hypertension: Secondary | ICD-10-CM | POA: Insufficient documentation

## 2013-06-15 DIAGNOSIS — F1010 Alcohol abuse, uncomplicated: Secondary | ICD-10-CM | POA: Insufficient documentation

## 2013-06-15 DIAGNOSIS — F10929 Alcohol use, unspecified with intoxication, unspecified: Secondary | ICD-10-CM

## 2013-06-15 DIAGNOSIS — Z87891 Personal history of nicotine dependence: Secondary | ICD-10-CM | POA: Insufficient documentation

## 2013-06-15 LAB — COMPREHENSIVE METABOLIC PANEL
AST: 114 U/L — ABNORMAL HIGH (ref 0–37)
BUN: 4 mg/dL — ABNORMAL LOW (ref 6–23)
CO2: 30 mEq/L (ref 19–32)
Calcium: 8.8 mg/dL (ref 8.4–10.5)
Chloride: 103 mEq/L (ref 96–112)
Creatinine, Ser: 0.83 mg/dL (ref 0.50–1.35)
GFR calc Af Amer: >90 mL/min (ref >90)
GFR calc non Af Amer: >90 mL/min (ref >90)
Glucose, Bld: 96 mg/dL (ref 70–99)
Sodium: 141 mEq/L (ref 135–145)
Total Bilirubin: 0.3 mg/dL (ref 0.3–1.2)
Total Protein: 7.5 g/dL (ref 6.0–8.3)

## 2013-06-15 LAB — CBC WITH DIFFERENTIAL/PLATELET
Basophils Absolute: 0.1 10*3/uL (ref 0.0–0.1)
Eosinophils Absolute: 0.2 10*3/uL (ref 0.0–0.7)
Eosinophils Relative: 4 % (ref 0–5)
HCT: 36.9 % — ABNORMAL LOW (ref 39.0–52.0)
Lymphocytes Relative: 36 % (ref 12–46)
MCH: 31.7 pg (ref 26.0–34.0)
MCV: 94.4 fL (ref 78.0–100.0)
Monocytes Absolute: 0.5 10*3/uL (ref 0.1–1.0)
Monocytes Relative: 10 % (ref 3–12)
Platelets: 132 10*3/uL — ABNORMAL LOW (ref 150–400)
RDW: 15 % (ref 11.5–15.5)
WBC: 4.5 10*3/uL (ref 4.0–10.5)

## 2013-06-15 MED ORDER — LORAZEPAM 2 MG/ML IJ SOLN
INTRAMUSCULAR | Status: AC
  Filled 2013-06-15: qty 1

## 2013-06-15 MED ORDER — SODIUM CHLORIDE 0.9 % IV SOLN
1000.0000 mg | Freq: Once | INTRAVENOUS | Status: AC
  Administered 2013-06-15: 1000 mg via INTRAVENOUS
  Filled 2013-06-15: qty 20

## 2013-06-15 MED ORDER — LORAZEPAM 2 MG/ML IJ SOLN
1.0000 mg | Freq: Once | INTRAMUSCULAR | Status: AC
  Administered 2013-06-15: 1 mg via INTRAVENOUS

## 2013-06-15 MED ORDER — PHENYTOIN SODIUM EXTENDED 100 MG PO CAPS: 300.0000 mg | ORAL_CAPSULE | Freq: Every day | ORAL | Status: DC

## 2013-06-15 NOTE — ED Notes (Signed)
PER EMS- pt picked up from brothers house with c/o seizures x2 since last night.  Reports pt has been drinking about a bottle wine.  Pt has hx of seizures and has been non compliant with medications. Usually takes dilantin.  Pt arrived to ED post-ictal.  No injuries noted.  Seizures witnessed by brother lasting about 1 min.

## 2013-06-15 NOTE — ED Notes (Signed)
Bed: [facility] Expected date:  Expected time:  Means of arrival:  Comments: ems- seizure ETOH

## 2013-06-15 NOTE — ED Provider Notes (Signed)
CSN: [id number]     Arrival date & time 06/15/13  1211 History   First MD Initiated Contact with Patient 06/15/13 1234     Chief Complaint  Patient presents with  . Seizures    HPI  Patient presents from his brother's house. He is having episodes of shaking. He states he was "drinking with some friends" all night. This was the Dilantin but has not. I was called at bedtime for alleged seizure. He is having purposeful activity as I examine him.  Past Medical History  Diagnosis Date  . ETOH abuse   . 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 Hypertension   . Alcohol related seizure     h/o withdrawal sz/notes 04/17/2013   Past Surgical History  Procedure Laterality Date  . No past surgeries     History reviewed. No pertinent family history. History  Substance Use Topics  . Smoking status: Former Smoker -- 0.50 packs/day for 30 years    Types: Cigarettes    Quit date: 01/22/2013  . Smokeless tobacco: Never Used  . Alcohol Use: Yes     Comment: 04/17/2013 "2 1/5th/day shared w/4-5 people"    Review of Systems  Constitutional: Negative for fever, chills, diaphoresis, appetite change and fatigue.  HENT: Negative for mouth sores, sore throat and trouble swallowing.   Eyes: Negative for visual disturbance.  Respiratory: Negative for cough, chest tightness, shortness of breath and wheezing.   Cardiovascular: Negative for chest pain.  Gastrointestinal: Negative for nausea, vomiting, abdominal pain, diarrhea and abdominal distention.  Endocrine: Negative for polydipsia, polyphagia and polyuria.  Genitourinary: Negative for dysuria, frequency and hematuria.  Musculoskeletal: Negative for gait problem.  Skin: Negative for color change, pallor and rash.  Neurological: Negative for dizziness, syncope, light-headedness and headaches.  Hematological: Does not bruise/bleed easily.  Psychiatric/Behavioral: Negative for  behavioral problems and confusion.    Allergies  Review of patient's allergies indicates no known allergies.  Home Medications   Current Outpatient Rx  Name  Route  Sig  Dispense  Refill  . phenytoin (DILANTIN) 100 MG ER capsule   Oral   Take 3 capsules (300 mg total) by mouth daily. Take 200 mg (2 capsules tomorrow morning when you get your prescription filled). Then begin taking 300mg  every night.   90 capsule   0   . phenytoin (DILANTIN) 100 MG ER capsule   Oral   Take 3 capsules (300 mg total) by mouth daily.   30 capsule   1    BP 134/85  Pulse 94  Temp(Src) 98.6 F (37 C) (Oral)  Resp 18  SpO2 99% Physical Exam  Constitutional: He is oriented to person, place, and time. He appears well-developed and well-nourished. No distress.  HENT:  Head: Normocephalic.  Eyes: Conjunctivae are normal. Pupils are equal, round, and reactive to light. No scleral icterus.  His right eye his is esotropic laterally. He states has been this way "for years".  Neck: Normal range of motion. Neck supple. No thyromegaly present.  Cardiovascular: Normal rate and regular rhythm.  Exam reveals no gallop and no friction rub.   No murmur heard. Pulmonary/Chest: Effort normal and breath sounds normal. No respiratory distress. He has no wheezes. He has no rales.  Abdominal: Soft. Bowel sounds are normal. He exhibits no distension. There is no tenderness. There is no rebound.  Musculoskeletal: Normal range of motion.  Neurological: He is  alert and oriented to person, place, and time.  Skin: Skin is warm and dry. No rash noted.  Psychiatric: He has a normal mood and affect. His behavior is normal.    ED Course  Procedures (including critical care time) Labs Review Labs Reviewed  CBC WITH DIFFERENTIAL - Abnormal; Notable for the following:    RBC 3.91 (*)    Hemoglobin 12.4 (*)    HCT 36.9 (*)    Platelets 132 (*)    All other components within normal limits  COMPREHENSIVE METABOLIC PANEL -  Abnormal; Notable for the following:    Potassium 3.4 (*)    BUN 4 (*)    Albumin 3.2 (*)    AST 114 (*)    All other components within normal limits  ETHANOL - Abnormal; Notable for the following:    Alcohol, Ethyl (B) 460 (*)    All other components within normal limits   Imaging Review No results found.  EKG Interpretation   None       MDM   1. Alcohol intoxication   2. Seizure disorder   3. Pseudoseizure    All levels were 20. He was not having seizure activity. He was lucid following commands and speaking during these episodes of shaking. Initially stated he had temporomandibular condition. Will watch until the alcohol level is lower he is more steady on his feet  He was loaded with Dilantin while in the emergency room.    Roney Marion, MD 06/16/13 [phone]

## 2013-06-15 NOTE — ED Notes (Signed)
Pt awake ambulates well up to bathroom

## 2013-06-15 NOTE — ED Notes (Addendum)
Tried to ambulate pt  Unsteady on feet lethargic, but  arouseable  placed back to bed

## 2013-07-02 ENCOUNTER — Emergency Department (HOSPITAL_COMMUNITY): Payer: Self-pay

## 2013-07-02 ENCOUNTER — Encounter (HOSPITAL_COMMUNITY): Payer: Self-pay | Admitting: Emergency Medicine

## 2013-07-02 ENCOUNTER — Emergency Department (HOSPITAL_COMMUNITY)
Admission: EM | Admit: 2013-07-02 | Discharge: 2013-07-03 | Disposition: A | Discharge status: Home / Self Care | Payer: Self-pay | Attending: Emergency Medicine | Admitting: Emergency Medicine

## 2013-07-02 DIAGNOSIS — S43402A Unspecified sprain of left shoulder joint, initial encounter: Secondary | ICD-10-CM

## 2013-07-02 DIAGNOSIS — R296 Repeated falls: Secondary | ICD-10-CM | POA: Insufficient documentation

## 2013-07-02 DIAGNOSIS — F1010 Alcohol abuse, uncomplicated: Secondary | ICD-10-CM | POA: Insufficient documentation

## 2013-07-02 DIAGNOSIS — R000 Tachycardia, unspecified: Secondary | ICD-10-CM | POA: Insufficient documentation

## 2013-07-02 DIAGNOSIS — Z87891 Personal history of nicotine dependence: Secondary | ICD-10-CM | POA: Insufficient documentation

## 2013-07-02 DIAGNOSIS — Z91199 Patient's noncompliance with other medical treatment and regimen due to unspecified reason: Secondary | ICD-10-CM | POA: Insufficient documentation

## 2013-07-02 DIAGNOSIS — Y929 Unspecified place or not applicable: Secondary | ICD-10-CM | POA: Insufficient documentation

## 2013-07-02 DIAGNOSIS — I10 Essential (primary) hypertension: Secondary | ICD-10-CM | POA: Insufficient documentation

## 2013-07-02 DIAGNOSIS — Y9389 Activity, other specified: Secondary | ICD-10-CM | POA: Insufficient documentation

## 2013-07-02 DIAGNOSIS — R569 Unspecified convulsions: Secondary | ICD-10-CM | POA: Insufficient documentation

## 2013-07-02 DIAGNOSIS — IMO0002 Reserved for concepts with insufficient information to code with codable children: Secondary | ICD-10-CM | POA: Insufficient documentation

## 2013-07-02 DIAGNOSIS — F10929 Alcohol use, unspecified with intoxication, unspecified: Secondary | ICD-10-CM

## 2013-07-02 DIAGNOSIS — Z9119 Patient's noncompliance with other medical treatment and regimen: Secondary | ICD-10-CM | POA: Insufficient documentation

## 2013-07-02 LAB — CBC
Hemoglobin: 13.8 g/dL (ref 13.0–17.0)
MCH: 31.9 pg (ref 26.0–34.0)
MCV: 94.2 fL (ref 78.0–100.0)
RBC: 4.33 MIL/uL (ref 4.22–5.81)

## 2013-07-02 LAB — BASIC METABOLIC PANEL
BUN: 3 mg/dL — ABNORMAL LOW (ref 6–23)
CO2: 30 mEq/L (ref 19–32)
Calcium: 9.3 mg/dL (ref 8.4–10.5)
Creatinine, Ser: 0.72 mg/dL (ref 0.50–1.35)
Glucose, Bld: 104 mg/dL — ABNORMAL HIGH (ref 70–99)
Sodium: 142 mEq/L (ref 135–145)

## 2013-07-02 LAB — PHENYTOIN LEVEL, TOTAL: Phenytoin Lvl: <2.5 ug/mL — ABNORMAL LOW (ref 10.0–20.0)

## 2013-07-02 MED ORDER — PHENYTOIN 125 MG/5ML PO SUSP
400.0000 mg | Freq: Once | ORAL | Status: AC
  Administered 2013-07-02: 400 mg via ORAL
  Filled 2013-07-02: qty 16

## 2013-07-02 MED ORDER — SODIUM CHLORIDE 0.9 % IV BOLUS (SEPSIS)
1000.0000 mL | Freq: Once | INTRAVENOUS | Status: AC
Start: 2013-07-02 — End: 2013-07-03
  Administered 2013-07-02: 1000 mL via INTRAVENOUS

## 2013-07-02 NOTE — ED Notes (Signed)
Per EMS, pt was sitting on porch, stood up and fell onto L arm. Pt is intoxicated. A&Ox4. NAD noted.

## 2013-07-02 NOTE — ED Notes (Signed)
Dr. Criss Alvine made aware of pt's ETOH level.

## 2013-07-02 NOTE — ED Notes (Signed)
Bed: WA21 Expected date:  Expected time:  Means of arrival:  Comments: EMS ETOH/FALL pain to shoulder

## 2013-07-02 NOTE — ED Provider Notes (Signed)
CSN: [id number]     Arrival date & time 07/02/13  1708 History   First MD Initiated Contact with Patient 07/02/13 1711     Chief Complaint  Patient presents with  . Fall  . Shoulder Pain  . Alcohol Intoxication   (Consider location/radiation/quality/duration/timing/severity/associated sxs/prior Treatment) HPI Comments: 52 year old male presents from home by EMS after a fall. Per EMS bystanders state that he stood up and then fell over and landed on his face. He is having left shoulder pain since then. The patient states that he was drinking alcohol, when asked how much and what kind he states "we were passing the bottle around". He states is not a member this event of falling. He also states that his friends told he had a seizure today. He has not been taking his Dilantin due to cost. It is difficult to obtain other history from the patient due to his acute intoxication   Past Medical History  Diagnosis Date  . ETOH abuse   . 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 Hypertension   . Alcohol related seizure     h/o withdrawal sz/notes 04/17/2013   Past Surgical History  Procedure Laterality Date  . No past surgeries     No family history on file. History  Substance Use Topics  . Smoking status: Former Smoker -- 0.50 packs/day for 30 years    Types: Cigarettes    Quit date: 01/22/2013  . Smokeless tobacco: Never Used  . Alcohol Use: Yes     Comment: 04/17/2013 "2 1/5th/day shared w/4-5 people"    Review of Systems  Unable to perform ROS: Mental status change    Allergies  Review of patient's allergies indicates no known allergies.  Home Medications   Current Outpatient Rx  Name  Route  Sig  Dispense  Refill  . phenytoin (DILANTIN) 100 MG ER capsule   Oral   Take 3 capsules (300 mg total) by mouth daily. Take 200 mg (2 capsules tomorrow morning when you get your prescription filled). Then begin taking 300mg   every night.   90 capsule   0   . phenytoin (DILANTIN) 100 MG ER capsule   Oral   Take 3 capsules (300 mg total) by mouth daily.   30 capsule   1    BP 124/86  Pulse 98  Temp(Src) 97.7 F (36.5 C) (Oral)  Resp 16  SpO2 99% Physical Exam  Nursing note and vitals reviewed. Constitutional: He appears well-developed and well-nourished.  Appears intoxicated  HENT:  Head: Normocephalic and atraumatic.  Right Ear: External ear normal.  Left Ear: External ear normal.  Nose: Nose normal.  Eyes: Right eye exhibits no discharge. Left eye exhibits no discharge.  Neck: Neck supple.  Cardiovascular: Normal rate, regular rhythm, normal heart sounds and intact distal pulses.   No murmur heard. Pulses:      Radial pulses are 2+ on the right side, and 2+ on the left side.  Heart rate near 100  Pulmonary/Chest: Effort normal and breath sounds normal.  Abdominal: Soft. He exhibits no distension. There is no tenderness.  Musculoskeletal:       Left shoulder: He exhibits tenderness. He exhibits no deformity and normal pulse.  Neurological: He is alert. He is disoriented.  Skin: Skin is warm and dry.    ED Course  Procedures (including critical care time) Labs Review Labs Reviewed  BASIC METABOLIC  PANEL - Abnormal; Notable for the following:    Glucose, Bld 104 (*)    BUN 3 (*)    All other components within normal limits  ETHANOL - Abnormal; Notable for the following:    Alcohol, Ethyl (B) 453 (*)    All other components within normal limits  PHENYTOIN LEVEL, TOTAL - Abnormal; Notable for the following:    Phenytoin Lvl <2.5 (*)    All other components within normal limits  CBC  GLUCOSE, CAPILLARY   Imaging Review Dg Shoulder Left Port  07/02/2013   CLINICAL DATA:  Fall, left shoulder pain  EXAM: PORTABLE LEFT SHOULDER - 2+ VIEW  COMPARISON:  None.  FINDINGS: Two views of left shoulder submitted. No acute fracture or subluxation. Mild spurring of distal clavicle.  IMPRESSION: No  acute fracture or subluxation.  Mild spurring of distal clavicle.   Electronically Signed   By: Natasha Mead M.D.   On: 07/02/2013 17:57    EKG Interpretation     Ventricular Rate:  96 PR Interval:  109 QRS Duration: 86 QT Interval:  347 QTC Calculation: 438 R Axis:   96 Text Interpretation:  Ectopic atrial rhythm Short PR interval Baseline wander in lead(s) V2 No significant change since last tracing            MDM   1. Alcohol intoxication   2. Shoulder sprain, left, initial encounter    Patient was given oral load of phenytoin as his level was undetectable. There's been no seizure-like activity here. Patient was quite intoxicated on arrival and was allowed to metabolize in the ED. There is no fracture or dislocation of his shoulder. The etiology of his fall/syncope is most consistent with acute, severe alcohol intoxication.    Audree Camel, MD 07/03/13 [phone]

## 2013-07-03 NOTE — Progress Notes (Signed)
Patient awakened and ambulated to bathroom with standby assist. Patient complains of soreness secondary to fall and gait is slightly unsteady. He did not require any hands on assistance with ambulation. Patient was offered and given fluids. He tolerated PO intake well.

## 2013-09-14 ENCOUNTER — Emergency Department (HOSPITAL_COMMUNITY)
Admission: EM | Admit: 2013-09-14 | Discharge: 2013-09-15 | Disposition: A | Discharge status: Home / Self Care | Payer: Self-pay | Attending: Emergency Medicine | Admitting: Emergency Medicine

## 2013-09-14 ENCOUNTER — Encounter (HOSPITAL_COMMUNITY): Payer: Self-pay | Admitting: Emergency Medicine

## 2013-09-14 DIAGNOSIS — R5383 Other fatigue: Secondary | ICD-10-CM

## 2013-09-14 DIAGNOSIS — G40909 Epilepsy, unspecified, not intractable, without status epilepticus: Secondary | ICD-10-CM | POA: Insufficient documentation

## 2013-09-14 DIAGNOSIS — F17200 Nicotine dependence, unspecified, uncomplicated: Secondary | ICD-10-CM | POA: Insufficient documentation

## 2013-09-14 DIAGNOSIS — R413 Other amnesia: Secondary | ICD-10-CM | POA: Insufficient documentation

## 2013-09-14 DIAGNOSIS — F10929 Alcohol use, unspecified with intoxication, unspecified: Secondary | ICD-10-CM

## 2013-09-14 DIAGNOSIS — R42 Dizziness and giddiness: Secondary | ICD-10-CM | POA: Insufficient documentation

## 2013-09-14 DIAGNOSIS — F10229 Alcohol dependence with intoxication, unspecified: Secondary | ICD-10-CM | POA: Insufficient documentation

## 2013-09-14 DIAGNOSIS — I10 Essential (primary) hypertension: Secondary | ICD-10-CM | POA: Insufficient documentation

## 2013-09-14 DIAGNOSIS — Z79899 Other long term (current) drug therapy: Secondary | ICD-10-CM | POA: Insufficient documentation

## 2013-09-14 DIAGNOSIS — R5381 Other malaise: Secondary | ICD-10-CM | POA: Insufficient documentation

## 2013-09-14 DIAGNOSIS — F1021 Alcohol dependence, in remission: Secondary | ICD-10-CM | POA: Insufficient documentation

## 2013-09-14 LAB — ETHANOL: ALCOHOL ETHYL (B): 474 mg/dL — AB (ref 0–11)

## 2013-09-14 LAB — URINALYSIS, ROUTINE W REFLEX MICROSCOPIC
BILIRUBIN URINE: NEGATIVE
GLUCOSE, UA: NEGATIVE mg/dL
Hgb urine dipstick: NEGATIVE
Ketones, ur: NEGATIVE mg/dL
Leukocytes, UA: NEGATIVE
NITRITE: NEGATIVE
PH: 5.5 (ref 5.0–8.0)
Protein, ur: NEGATIVE mg/dL
Specific Gravity, Urine: 1.006 (ref 1.005–1.030)
Urobilinogen, UA: 0.2 mg/dL (ref 0.0–1.0)

## 2013-09-14 LAB — PHENYTOIN LEVEL, TOTAL: Phenytoin Lvl: <2.5 ug/mL — ABNORMAL LOW (ref 10.0–20.0)

## 2013-09-14 MED ORDER — SODIUM CHLORIDE 0.9 % IV SOLN
INTRAVENOUS | Status: AC
  Administered 2013-09-14: 15:00:00 via INTRAVENOUS

## 2013-09-14 MED ORDER — SODIUM CHLORIDE 0.9 % IV BOLUS (SEPSIS)
500.0000 mL | INTRAVENOUS | Status: AC
  Administered 2013-09-14: 500 mL via INTRAVENOUS

## 2013-09-14 MED ORDER — SODIUM CHLORIDE 0.9 % IV SOLN
1000.0000 mg | Freq: Once | INTRAVENOUS | Status: AC
  Administered 2013-09-14: 1000 mg via INTRAVENOUS
  Filled 2013-09-14: qty 20

## 2013-09-14 MED ORDER — LORAZEPAM 2 MG/ML IJ SOLN
1.0000 mg | INTRAMUSCULAR | Status: DC | PRN
  Administered 2013-09-14: 1 mg via INTRAVENOUS
  Filled 2013-09-14: qty 1

## 2013-09-14 NOTE — ED Notes (Signed)
Pt awake. Pt spoke on phone to family/friend. Attempted to ambulate pt. However he is still very unstable. Pt placed back in bed and given coffee per request. Vs stable.

## 2013-09-14 NOTE — ED Provider Notes (Signed)
I assumed care of patient at signout to monitor patient until sobriety He is sleeping but easily arousable Will continue to monitor.  He is currently has 100% pulse ox on RA Labs/ekg reviewed   Joya Gaskinsonald W Shizuko Wojdyla, MD 09/14/13 1709

## 2013-09-14 NOTE — Discharge Instructions (Signed)
Please be sure to followup with your physician.  Take all medication as directed, including your Dilantin.   Alcohol Intoxication Alcohol intoxication occurs when the amount of alcohol that a person has consumed impairs his or her ability to mentally and physically function. Alcohol directly impairs the normal chemical activity of the brain. Drinking large amounts of alcohol can lead to changes in mental function and behavior, and it can cause many physical effects that can be harmful.  Alcohol intoxication can range in severity from mild to very severe. Various factors can affect the level of intoxication that occurs, such as the person's age, gender, weight, frequency of alcohol consumption, and the presence of other medical conditions (such as diabetes, seizures, or heart conditions). Dangerous levels of alcohol intoxication may occur when people drink large amounts of alcohol in a short period (binge drinking). Alcohol can also be especially dangerous when combined with certain prescription medicines or "recreational" drugs. SIGNS AND SYMPTOMS Some common signs and symptoms of mild alcohol intoxication include:  Loss of coordination.  Changes in mood and behavior.  Impaired judgment.  Slurred speech. As alcohol intoxication progresses to more severe levels, other signs and symptoms will appear. These may include:  Vomiting.  Confusion and impaired memory.  Slowed breathing.  Seizures.  Loss of consciousness. DIAGNOSIS  Your health care provider will take a medical history and perform a physical exam. You will be asked about the amount and type of alcohol you have consumed. Blood tests will be done to measure the concentration of alcohol in your blood. In many places, your blood alcohol level must be lower than 80 mg/dL (0.98%0.08%) to legally drive. However, many dangerous effects of alcohol can occur at much lower levels.  TREATMENT  People with alcohol intoxication often do not  require treatment. Most of the effects of alcohol intoxication are temporary, and they go away as the alcohol naturally leaves the body. Your health care provider will monitor your condition until you are stable enough to go home. Fluids are sometimes given through an IV access tube to help prevent dehydration.  HOME CARE INSTRUCTIONS  Do not drive after drinking alcohol.  Stay hydrated. Drink enough water and fluids to keep your urine clear or pale yellow. Avoid caffeine.   Only take over-the-counter or prescription medicines as directed by your health care provider.  SEEK MEDICAL CARE IF:   You have persistent vomiting.   You do not feel better after a few days.  You have frequent alcohol intoxication. Your health care provider can help determine if you should see a substance use treatment counselor. SEEK IMMEDIATE MEDICAL CARE IF:   You become shaky or tremble when you try to stop drinking.   You shake uncontrollably (seizure).   You throw up (vomit) blood. This may be bright red or may look like black coffee grounds.   You have blood in your stool. This may be bright red or may appear as a black, tarry, bad smelling stool.   You become lightheaded or faint.  MAKE SURE YOU:   Understand these instructions.  Will watch your condition.  Will get help right away if you are not doing well or get worse. Document Released: 05/20/2005 Document Revised: 04/12/2013 Document Reviewed: 01/13/2013 [facility] Patient Information 2014 [employer], [place].

## 2013-09-14 NOTE — ED Notes (Signed)
CRITICAL VALUE ALERT  Critical value received:Alcohol 474  Date of notification:  09/14/2013  Time of notification:  1419  Critical value read back Yes  Nurse who received alert:  Christin BachBrittney Eugena Rhue   MD notified (1st page):  Dr. Jeraldine LootsLockwood

## 2013-09-14 NOTE — ED Notes (Signed)
Spoke to family. Call mitch and Lawson Fiscallori (brother and sister) once d/c- [phone]

## 2013-09-14 NOTE — ED Notes (Signed)
Spoke with lab about wait time on phenytoin level. Jeraldine LootsLockwood ordered a phenytoin, free which takes appx 24 hours to result. Added on Phenytoin level, total which is being processed now. Md Bebe ShaggyWickline made aware.

## 2013-09-14 NOTE — ED Provider Notes (Signed)
CSN: [id number]     Arrival date & time 09/14/13  1230 History   First MD Initiated Contact with Patient 09/14/13 1231     Chief Complaint  Patient presents with  . Seizures   (Consider location/radiation/quality/duration/timing/severity/associated sxs/prior Treatment) HPI Patient presents of a possible seizure. Patient has a long history of seizures. He states that he has not been compliant with medication.  He continues to drink alcohol.  There is a period of amnesia just prior to EMS call, during which the patient felt lightheaded, then regained consciousness slowly. He currently has no pain, no new lightheadedness, no new dyspnea Past Medical History  Diagnosis Date  . ETOH abuse   . 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 Hypertension   . Alcohol related seizure     h/o withdrawal sz/notes 04/17/2013   Past Surgical History  Procedure Laterality Date  . No past surgeries     History reviewed. No pertinent family history. History  Substance Use Topics  . Smoking status: Current Every Day Smoker -- 0.50 packs/day for 30 years    Types: Cigarettes    Last Attempt to Quit: 01/22/2013  . Smokeless tobacco: Never Used  . Alcohol Use: Yes     Comment: 04/17/2013 "2 1/5th/day shared w/4-5 people"    Review of Systems  Constitutional:       Per HPI, otherwise negative  HENT:       Per HPI, otherwise negative  Respiratory:       Per HPI, otherwise negative  Cardiovascular:       Per HPI, otherwise negative  Gastrointestinal: Negative for vomiting.  Endocrine:       Negative aside from HPI  Genitourinary:       Neg aside from HPI   Musculoskeletal:       Chronic back and foot pain, unchanged  Skin: Negative.   Neurological: Positive for seizures and weakness. Negative for syncope.    Allergies  Review of patient's allergies indicates no known allergies.  Home Medications   Current Outpatient Rx   Name  Route  Sig  Dispense  Refill  . phenytoin (DILANTIN) 100 MG ER capsule   Oral   Take 3 capsules (300 mg total) by mouth daily. Take 200 mg (2 capsules tomorrow morning when you get your prescription filled). Then begin taking 300mg  every night.   90 capsule   0    BP 149/92  Pulse 99  Temp(Src) 98.2 F (36.8 C) (Oral)  Resp 11  SpO2 99% Physical Exam  Nursing note and vitals reviewed. Constitutional: He is oriented to person, place, and time. He appears well-developed. No distress.  HENT:  Head: Normocephalic and atraumatic.  No glossal trauma  Eyes: Conjunctivae and EOM are normal.  Cardiovascular: Normal rate and regular rhythm.   Pulmonary/Chest: Effort normal. No stridor. No respiratory distress.  Abdominal: He exhibits no distension.  Musculoskeletal: He exhibits no edema.  Neurological: He is alert and oriented to person, place, and time. No cranial nerve deficit. He exhibits normal muscle tone. Coordination normal.  Skin: Skin is warm and dry.  Psychiatric: He has a normal mood and affect.    ED Course  Procedures (including critical care time) Labs Review Labs Reviewed  URINALYSIS, ROUTINE W REFLEX MICROSCOPIC  PHENYTOIN LEVEL, FREE  ETHANOL   Imaging Review No results found.  EKG Interpretation    Date/Time:  Thursday September 14 2013 12:56:24 EST Ventricular Rate:  91 PR Interval:  113 QRS Duration: 91 QT Interval:  358 QTC Calculation: 440 R Axis:   70 Text Interpretation:  Ectopic atrial rhythm Borderline short PR interval ST elev, probable normal early repol pattern Baseline wander in lead(s) V2 Sinus rhythm Early repolarization No significant change since last tracing Abnormal ekg Confirmed by Gerhard Munch  MD [phone]) on 09/14/2013 1:24:28 PM           I reviewed the EMR   2:28 PM Patient's blood alcohol level is greater than 400. On repeat exam he appears comfortable. Given the need for achieving sobriety, patient will be  monitored.  He will receive loading dose of Dilantin.  MDM  No diagnosis found. Patient presents after a possible seizure with gross alcohol intoxication.  On exam patient is awake alert, speaking, though with significant elevation in his alcohol level he required monitoring for return to sobriety. Onset patient was in stable though guarded condition.    Gerhard Munch, MD 09/14/13 [phone]

## 2013-09-14 NOTE — ED Notes (Signed)
Transport patient to new room assignment.

## 2013-09-14 NOTE — ED Notes (Signed)
Per EMS: Pt's brothers reports 3 seizures witnessed by brother, hx of the same. Non compliant with medication. Pt reports drinking whiskey all morning. Pt also reports left sided abdominal x "awhile." Pt also concerned about recent blood in urine. BG 113. BP 146/90. 94 bpm.

## 2013-09-15 NOTE — ED Notes (Signed)
Dr. Bebe ShaggyWickline and 2 RN's in to assess pt. Ambulated pt to restroom with 2 standby assist. Pt still very unstable. Will continue to monitor pt until morning.

## 2013-09-15 NOTE — ED Notes (Signed)
Attempted to walk pt to bathroom.  Pt to unsteady on his feet to walk without holding onto something, so returned to bed, and pt provided with a urinal.

## 2013-09-15 NOTE — ED Provider Notes (Signed)
Pt awake/alert, but still has difficulty ambulating and reports dizziness.  He denies HA Signed out to dr Norlene Campbellotter to allow pt to continue to sleep Will need to be discharged home with dilantin   Joya Gaskinsonald W Aithana Kushner, MD 09/15/13 [phone]

## 2013-09-19 LAB — PHENYTOIN LEVEL, FREE AND TOTAL
Phenytoin, Free: <0.5 mg/L (ref 1.0–2.0)
Phenytoin, Total: <2.5 mg/L (ref 10.0–20.0)

## 2013-11-05 ENCOUNTER — Emergency Department (HOSPITAL_COMMUNITY): Payer: Self-pay

## 2013-11-05 ENCOUNTER — Encounter (HOSPITAL_COMMUNITY): Payer: Self-pay | Admitting: Emergency Medicine

## 2013-11-05 ENCOUNTER — Emergency Department (HOSPITAL_COMMUNITY)
Admission: EM | Admit: 2013-11-05 | Discharge: 2013-11-05 | Disposition: A | Discharge status: Home / Self Care | Payer: Self-pay | Attending: Emergency Medicine | Admitting: Emergency Medicine

## 2013-11-05 DIAGNOSIS — F17200 Nicotine dependence, unspecified, uncomplicated: Secondary | ICD-10-CM | POA: Insufficient documentation

## 2013-11-05 DIAGNOSIS — F1010 Alcohol abuse, uncomplicated: Secondary | ICD-10-CM | POA: Insufficient documentation

## 2013-11-05 DIAGNOSIS — R51 Headache: Secondary | ICD-10-CM | POA: Insufficient documentation

## 2013-11-05 DIAGNOSIS — I10 Essential (primary) hypertension: Secondary | ICD-10-CM | POA: Insufficient documentation

## 2013-11-05 DIAGNOSIS — F10929 Alcohol use, unspecified with intoxication, unspecified: Secondary | ICD-10-CM

## 2013-11-05 LAB — CBC WITH DIFFERENTIAL/PLATELET
BASOS ABS: 0.1 10*3/uL (ref 0.0–0.1)
BASOS PCT: 1 % (ref 0–1)
EOS ABS: 0.2 10*3/uL (ref 0.0–0.7)
Eosinophils Relative: 3 % (ref 0–5)
HCT: 41.6 % (ref 39.0–52.0)
HEMOGLOBIN: 14.3 g/dL (ref 13.0–17.0)
Lymphocytes Relative: 47 % — ABNORMAL HIGH (ref 12–46)
Lymphs Abs: 2.8 10*3/uL (ref 0.7–4.0)
MCH: 32.6 pg (ref 26.0–34.0)
MCHC: 34.4 g/dL (ref 30.0–36.0)
MCV: 94.8 fL (ref 78.0–100.0)
MONOS PCT: 8 % (ref 3–12)
Monocytes Absolute: 0.5 10*3/uL (ref 0.1–1.0)
Neutro Abs: 2.5 10*3/uL (ref 1.7–7.7)
Neutrophils Relative %: 41 % — ABNORMAL LOW (ref 43–77)
Platelets: 153 10*3/uL (ref 150–400)
RBC: 4.39 MIL/uL (ref 4.22–5.81)
RDW: 14.6 % (ref 11.5–15.5)
WBC: 6 10*3/uL (ref 4.0–10.5)

## 2013-11-05 LAB — BASIC METABOLIC PANEL
BUN: 4 mg/dL — AB (ref 6–23)
CO2: 29 mEq/L (ref 19–32)
CREATININE: 0.81 mg/dL (ref 0.50–1.35)
Calcium: 8.9 mg/dL (ref 8.4–10.5)
Chloride: 100 mEq/L (ref 96–112)
Glucose, Bld: 83 mg/dL (ref 70–99)
POTASSIUM: 5.1 meq/L (ref 3.7–5.3)
Sodium: 142 mEq/L (ref 137–147)

## 2013-11-05 MED ORDER — SODIUM CHLORIDE 0.9 % IV BOLUS (SEPSIS)
1000.0000 mL | Freq: Once | INTRAVENOUS | Status: AC
  Administered 2013-11-05: 1000 mL via INTRAVENOUS

## 2013-11-05 NOTE — Discharge Instructions (Signed)
Alcohol Intoxication Alcohol intoxication occurs when the amount of alcohol that a person has consumed impairs his or her ability to mentally and physically function. Alcohol directly impairs the normal chemical activity of the brain. Drinking large amounts of alcohol can lead to changes in mental function and behavior, and it can cause many physical effects that can be harmful.  Alcohol intoxication can range in severity from mild to very severe. Various factors can affect the level of intoxication that occurs, such as the person's age, gender, weight, frequency of alcohol consumption, and the presence of other medical conditions (such as diabetes, seizures, or heart conditions). Dangerous levels of alcohol intoxication may occur when people drink large amounts of alcohol in a short period (binge drinking). Alcohol can also be especially dangerous when combined with certain prescription medicines or "recreational" drugs. SIGNS AND SYMPTOMS Some common signs and symptoms of mild alcohol intoxication include:  Loss of coordination.  Changes in mood and behavior.  Impaired judgment.  Slurred speech. As alcohol intoxication progresses to more severe levels, other signs and symptoms will appear. These may include:  Vomiting.  Confusion and impaired memory.  Slowed breathing.  Seizures.  Loss of consciousness. DIAGNOSIS  Your health care provider will take a medical history and perform a physical exam. You will be asked about the amount and type of alcohol you have consumed. Blood tests will be done to measure the concentration of alcohol in your blood. In many places, your blood alcohol level must be lower than 80 mg/dL (1.19%) to legally drive. However, many dangerous effects of alcohol can occur at much lower levels.  TREATMENT  People with alcohol intoxication often do not require treatment. Most of the effects of alcohol intoxication are temporary, and they go away as the alcohol naturally  leaves the body. Your health care provider will monitor your condition until you are stable enough to go home. Fluids are sometimes given through an IV access tube to help prevent dehydration.  HOME CARE INSTRUCTIONS  Do not drive after drinking alcohol.  Stay hydrated. Drink enough water and fluids to keep your urine clear or pale yellow. Avoid caffeine.   Only take over-the-counter or prescription medicines as directed by your health care provider.  SEEK MEDICAL CARE IF:   You have persistent vomiting.   You do not feel better after a few days.  You have frequent alcohol intoxication. Your health care provider can help determine if you should see a substance use treatment counselor. SEEK IMMEDIATE MEDICAL CARE IF:   You become shaky or tremble when you try to stop drinking.   You shake uncontrollably (seizure).   You throw up (vomit) blood. This may be bright red or may look like black coffee grounds.   You have blood in your stool. This may be bright red or may appear as a black, tarry, bad smelling stool.   You become lightheaded or faint.  MAKE SURE YOU:   Understand these instructions.  Will watch your condition.  Will get help right away if you are not doing well or get worse. Document Released: 05/20/2005 Document Revised: 04/12/2013 Document Reviewed: 01/13/2013 [facility] Patient Information 2014 [place], [place]. Seizure, Adult A seizure is abnormal electrical activity in the brain. Seizures usually last from 30 seconds to 2 minutes. There are various types of seizures. Before a seizure, you may have a warning sensation (aura) that a seizure is about to occur. An aura may include the following symptoms:   Fear or anxiety.  Nausea.  Feeling like the room is spinning (vertigo).  Vision changes, such as seeing flashing lights or spots. Common symptoms during a seizure include:  A change in attention or behavior (altered mental status).  Convulsions with  rhythmic jerking movements.  Drooling.  Rapid eye movements.  Grunting.  Loss of bladder and bowel control.  Bitter taste in the mouth.  Tongue biting. After a seizure, you may feel confused and sleepy. You may also have an injury resulting from convulsions during the seizure. HOME CARE INSTRUCTIONS   If you are given medicines, take them exactly as prescribed by your health care provider.  Keep all follow-up appointments as directed by your health care provider.  Do not swim or drive or engage in risky activity during which a seizure could cause further injury to you or others until your health care provider says it is OK.  Get adequate rest.  Teach friends and family what to do if you have a seizure. They should:  Lay you on the ground to prevent a fall.  Put a cushion under your head.  Loosen any tight clothing around your neck.  Turn you on your side. If vomiting occurs, this helps keep your airway clear.  Stay with you until you recover.  Know whether or not you need emergency care. SEEK IMMEDIATE MEDICAL CARE IF:  The seizure lasts longer than 5 minutes.  The seizure is severe or you do not wake up immediately after the seizure.  You have an altered mental status after the seizure.  You are having more frequent or worsening seizures. Someone should drive you to the emergency department or call local emergency services (911 in [place] MAKE SURE YOU:  Understand these instructions.  Will watch your condition.  Will get help right away if you are not doing well or get worse. Document Released: 08/07/2000 Document Revised: 05/31/2013 Document Reviewed: 03/22/2013 [facility] Patient Information 2014 [place].

## 2013-11-05 NOTE — ED Notes (Signed)
Pt wanting to sleep and doesn't want to be disturbed.Pt getting aggitated .

## 2013-11-05 NOTE — ED Provider Notes (Signed)
CSN: [id number]     Arrival date & time 11/05/13  0043 History   First MD Initiated Contact with Patient 11/05/13 0326     Chief Complaint  Patient presents with  . Alcohol Intoxication     (Consider location/radiation/quality/duration/timing/severity/associated sxs/prior Treatment) HPI Comments: PT comes in with cc of fall. LEVEL 5 CAVEAT FOR INTOXICATION AND ALTERED MENTAL STATUS. Per nursing report, patient's family reported that he had seizure like activity, and had a fall. Pt is not sure what happened, and admits to drinking. He has a headache from the fall. No other complains.  Patient is a 53 y.o. male presenting with intoxication. The history is provided by the patient.  Alcohol Intoxication Associated symptoms include headaches.    Past Medical History  Diagnosis Date  . ETOH abuse   . 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 Hypertension   . Alcohol related seizure     h/o withdrawal sz/notes 04/17/2013   Past Surgical History  Procedure Laterality Date  . No past surgeries     No family history on file. History  Substance Use Topics  . Smoking status: Current Every Day Smoker -- 0.50 packs/day for 30 years    Types: Cigarettes    Last Attempt to Quit: 01/22/2013  . Smokeless tobacco: Never Used  . Alcohol Use: Yes     Comment: 04/17/2013 "2 1/5th/day shared w/4-5 people"    Review of Systems  Unable to perform ROS: Other  Skin: Positive for wound.  Neurological: Positive for headaches.      Allergies  Review of patient's allergies indicates no known allergies.  Home Medications  No current outpatient prescriptions on file. BP 112/78  Pulse 83  Temp(Src) 98.7 F (37.1 C) (Oral)  Resp 17  SpO2 99% Physical Exam  Nursing note and vitals reviewed. Constitutional: He is oriented to person, place, and time. He appears well-developed.  HENT:  Head: Normocephalic and atraumatic.  RIGHT  INFRA-ORBITAL lesion, with active bleeding. EOMI Visual acuity at bedside is normal.   Eyes: Conjunctivae and EOM are normal. Pupils are equal, round, and reactive to light.  Neck: Normal range of motion. Neck supple.  Cardiovascular: Normal rate and regular rhythm.   Pulmonary/Chest: Effort normal and breath sounds normal.  Abdominal: Soft. Bowel sounds are normal. He exhibits no distension. There is no tenderness. There is no rebound and no guarding.  Neurological: He is alert and oriented to person, place, and time.  Skin: Skin is warm.    ED Course  Procedures (including critical care time) Labs Review Labs Reviewed  CBC WITH DIFFERENTIAL - Abnormal; Notable for the following:    Neutrophils Relative % 41 (*)    Lymphocytes Relative 47 (*)    All other components within normal limits  BASIC METABOLIC PANEL - Abnormal; Notable for the following:    BUN 4 (*)    All other components within normal limits   Imaging Review Ct Head Wo Contrast  11/05/2013   CLINICAL DATA:  Alcohol intoxication.  Fall.  EXAM: CT HEAD WITHOUT CONTRAST  TECHNIQUE: Contiguous axial images were obtained from the base of the skull through the vertex without intravenous contrast.  COMPARISON:  04/07/2013  FINDINGS: Moderate atrophy.  Negative for acute infarct, hemorrhage, or mass.  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 There is erosion of the outer and inner table. No associated  extraosseous mass. No change from 07/25/2010.  IMPRESSION: Generalized atrophy.  No acute abnormality.   Electronically Signed   By: Marlan Palau M.D.   On: 11/05/2013 05:15     EKG Interpretation None      MDM   Final diagnoses:  Alcohol intoxication    DDx includes: - Mechanical falls - ICH - Fractures - Contusions - Soft tissue injury  Pt comes in post fall. Pt was intoxicated, and he has no idea how he fell. Family reports seizure like activity at arrival, but no one there  to give [place] history during my exam.Neuro exam normal, pt ambulated. Eye exam is normal. Abrasion and small laceration infra-orbital - we will place dressing there.   Derwood Kaplan, MD 11/05/13 [phone]

## 2013-11-05 NOTE — ED Notes (Addendum)
Pt brought to ED by EMS with alcohol  intoxication and possible seizures(not sure).pt says he fell and has bruises over the bridge of the nose and under rt eye.CBG 84.

## 2014-06-08 IMAGING — CR DG LUMBAR SPINE COMPLETE 4+V
5 series · 5 of 5 positions shown · non-contrast
Comparison: None.

CLINICAL DATA: Syncope, lower back pain

LUMBAR SPINE - COMPLETE 4+ VIEW

[t lumbar spine ap]
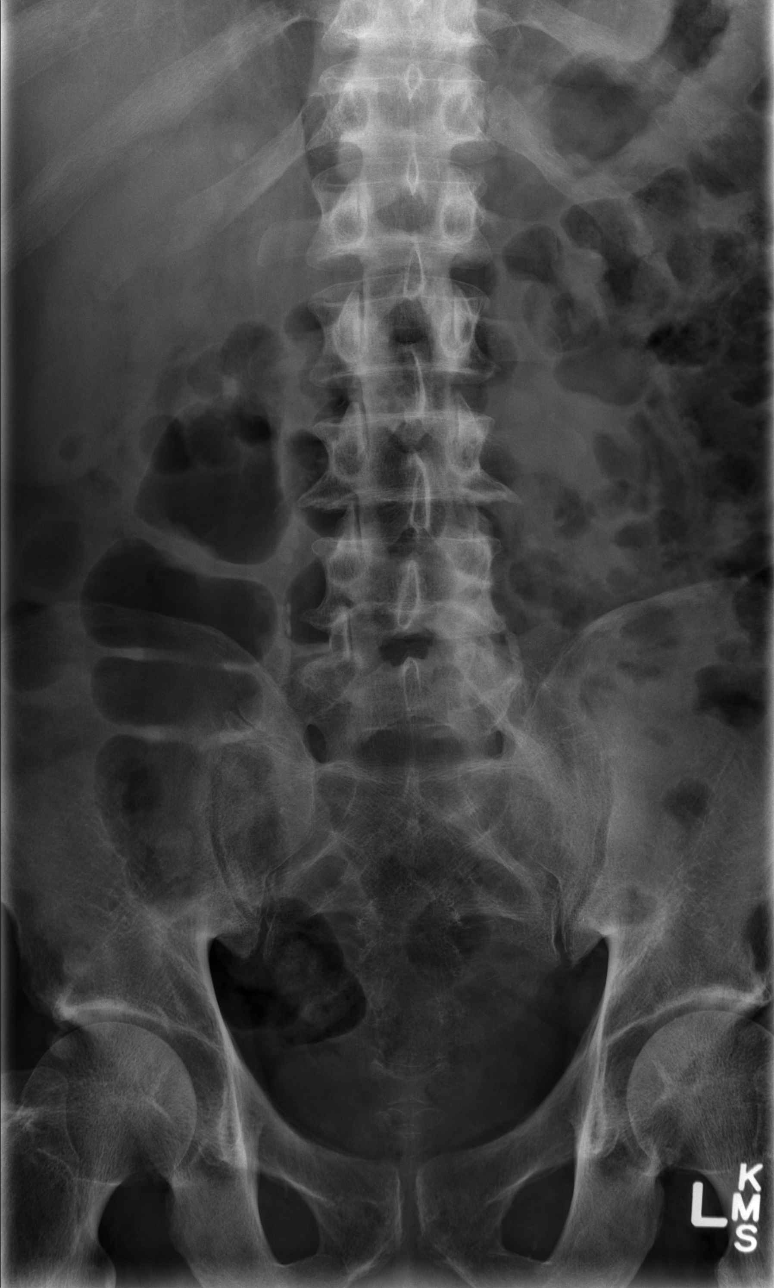

[t lumbar spine obl (1 of 2)]
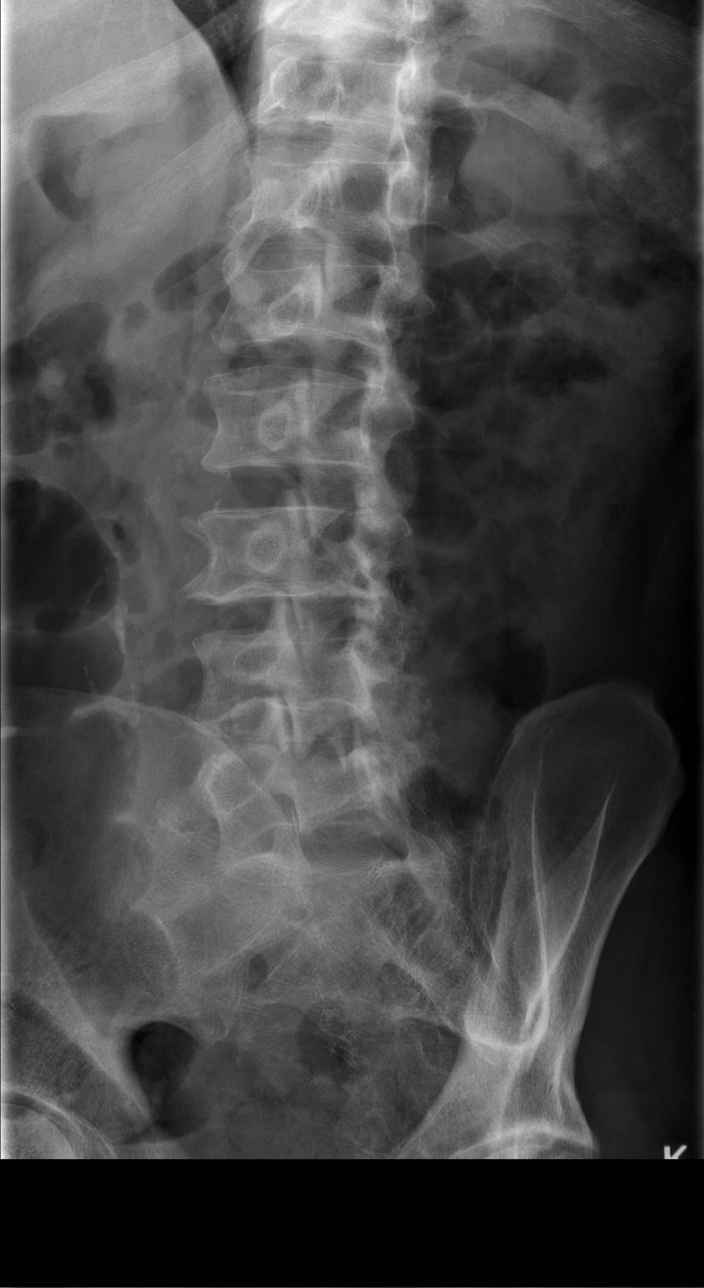

[t lumbar spine obl (2 of 2)]
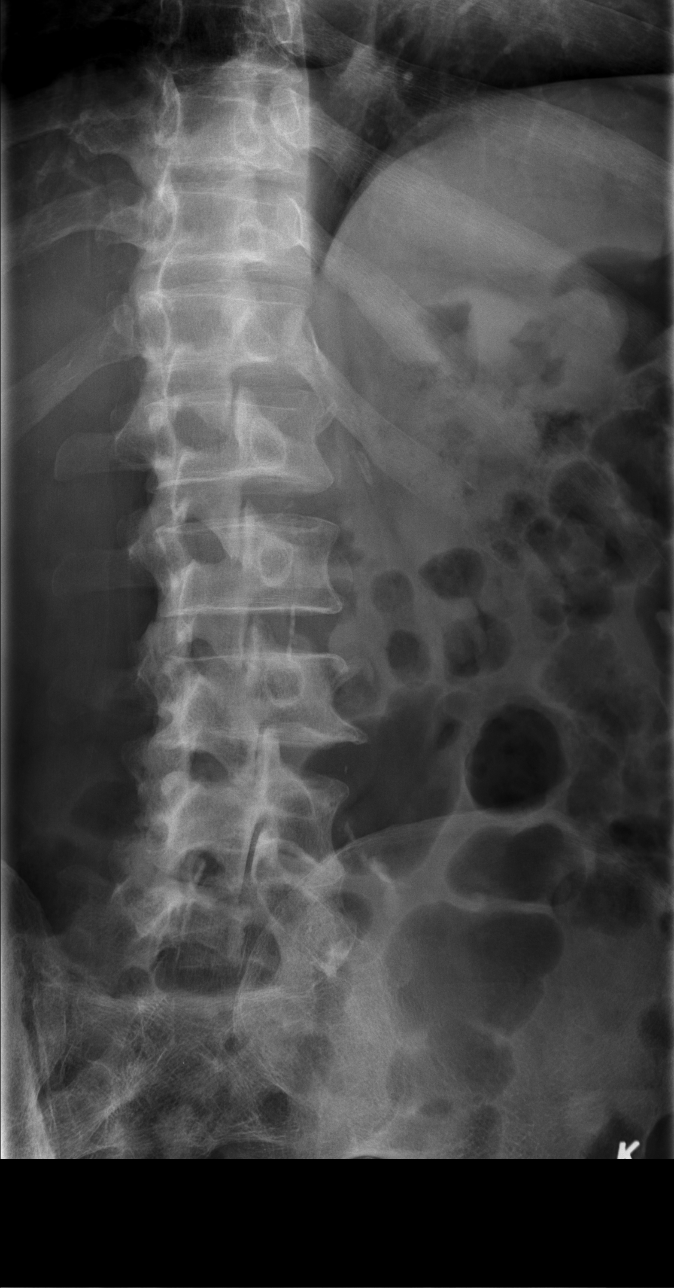

[t lumbar spine lat]
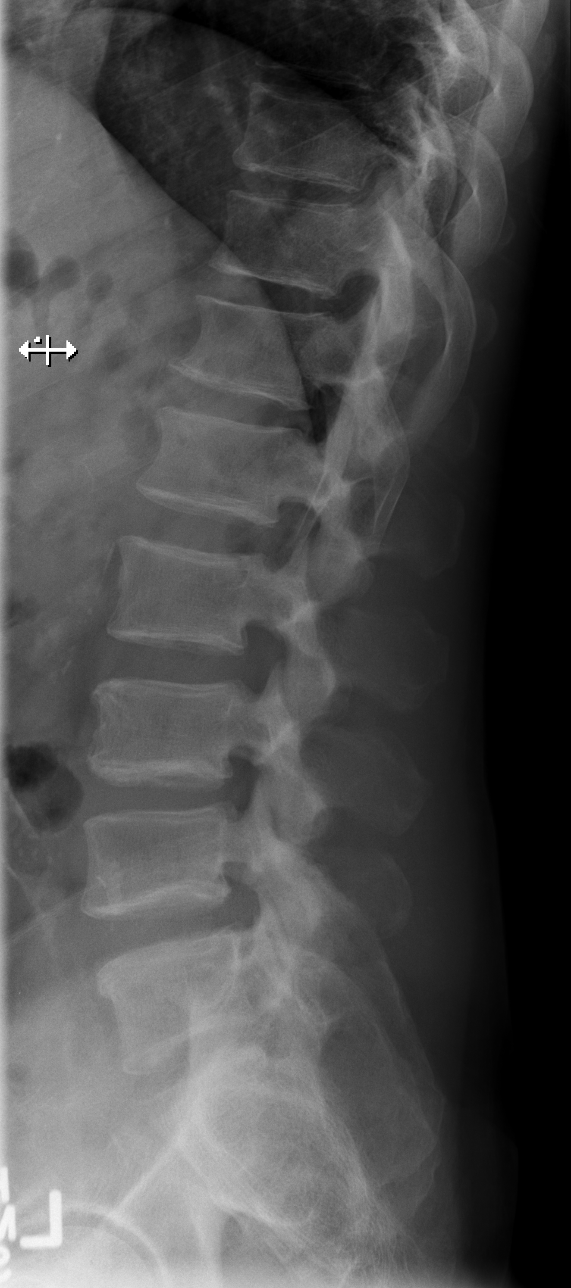

[t lumbar l-5 s-1 spot]
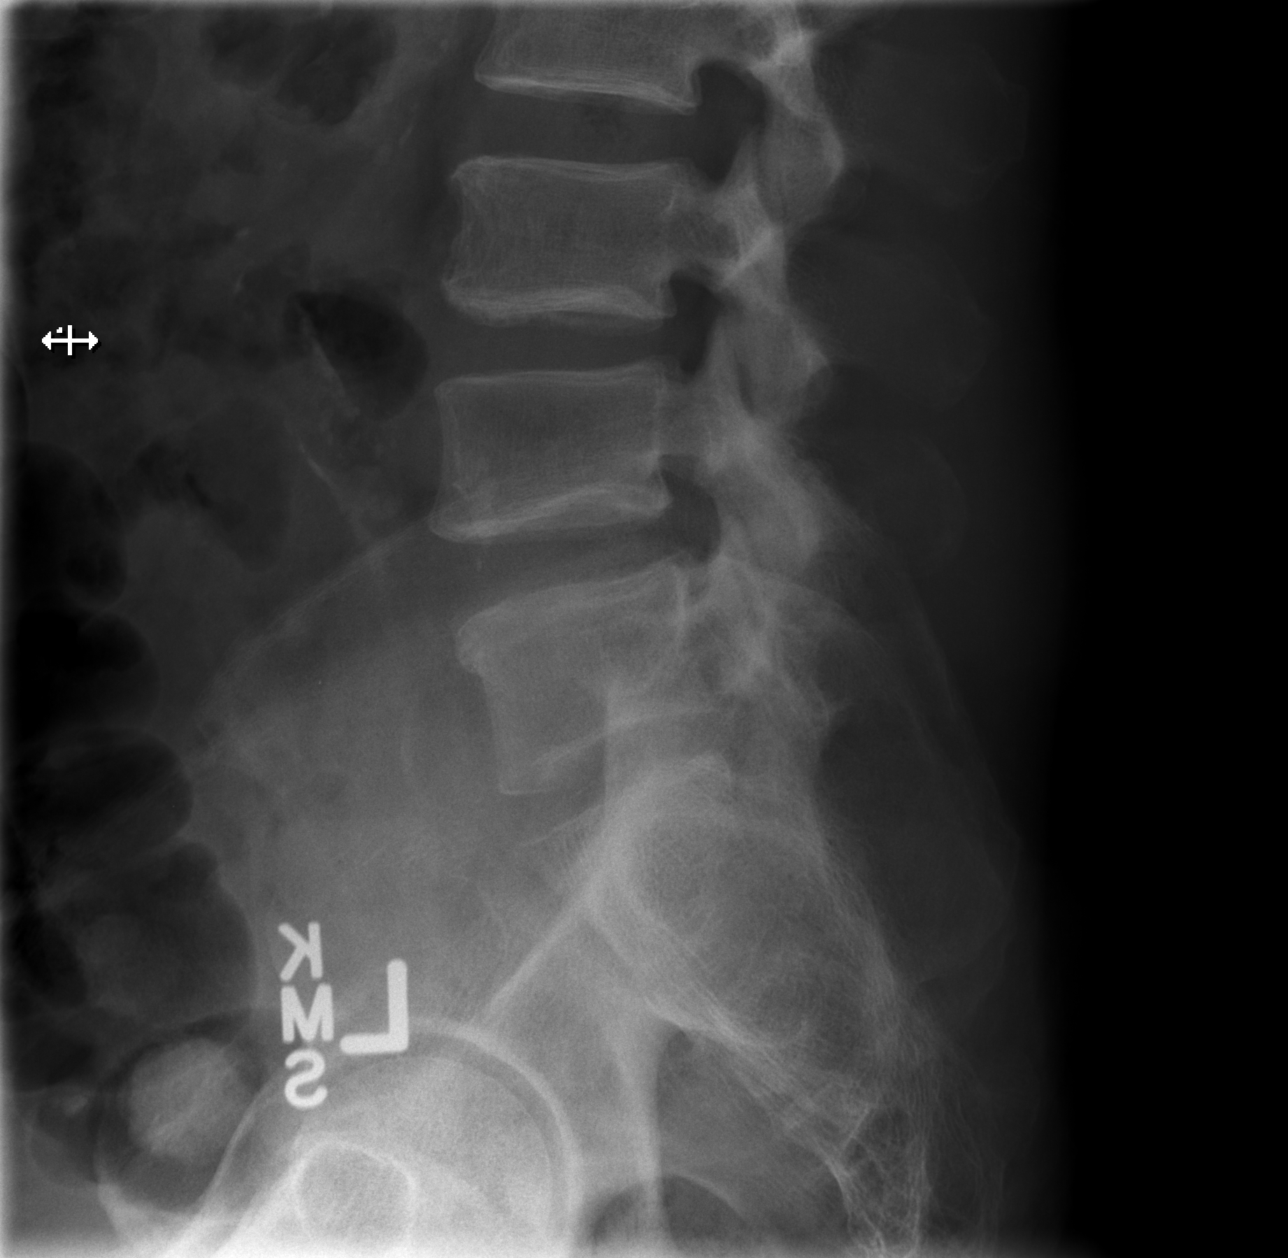

[5 of 5 positions shown; findings below may reference images not displayed]

FINDINGS: AP, lateral and bilateral oblique radiographs of the
lumbosacral spine demonstrate no evidence for acute fracture or
malalignment.  Vertebral body heights and intervertebral disc
spaces are maintained.  Mild lower lumbar degenerative disc disease
and facet arthropathy.  Scattered atherosclerotic vascular
calcifications identified in the aorto-iliac system.  Visualized
bowel gas pattern is unremarkable.  Visualized pelvis is intact and
unremarkable.
IMPRESSION: 1.  No acute fracture or malalignment
2.  Mild multilevel lower lumbar degenerative disc disease and
facet arthropathy
3.  Atherosclerotic vascular calcifications

## 2014-07-17 ENCOUNTER — Emergency Department (HOSPITAL_COMMUNITY)
Admission: EM | Admit: 2014-07-17 | Discharge: 2014-07-18 | Disposition: A | Discharge status: Home / Self Care | Payer: Self-pay | Attending: Emergency Medicine | Admitting: Emergency Medicine

## 2014-07-17 ENCOUNTER — Encounter (HOSPITAL_COMMUNITY): Payer: Self-pay | Admitting: Emergency Medicine

## 2014-07-17 DIAGNOSIS — Z720 Tobacco use: Secondary | ICD-10-CM | POA: Insufficient documentation

## 2014-07-17 DIAGNOSIS — R569 Unspecified convulsions: Secondary | ICD-10-CM | POA: Insufficient documentation

## 2014-07-17 DIAGNOSIS — F10920 Alcohol use, unspecified with intoxication, uncomplicated: Secondary | ICD-10-CM | POA: Insufficient documentation

## 2014-07-17 DIAGNOSIS — Z8669 Personal history of other diseases of the nervous system and sense organs: Secondary | ICD-10-CM | POA: Insufficient documentation

## 2014-07-17 DIAGNOSIS — Z9114 Patient's other noncompliance with medication regimen: Secondary | ICD-10-CM

## 2014-07-17 DIAGNOSIS — M79659 Pain in unspecified thigh: Secondary | ICD-10-CM | POA: Insufficient documentation

## 2014-07-17 DIAGNOSIS — Z9119 Patient's noncompliance with other medical treatment and regimen: Secondary | ICD-10-CM | POA: Insufficient documentation

## 2014-07-17 DIAGNOSIS — I10 Essential (primary) hypertension: Secondary | ICD-10-CM | POA: Insufficient documentation

## 2014-07-17 LAB — CBC WITH DIFFERENTIAL/PLATELET
BASOS ABS: 0.1 10*3/uL (ref 0.0–0.1)
Basophils Relative: 1 % (ref 0–1)
Eosinophils Absolute: 0.3 10*3/uL (ref 0.0–0.7)
Eosinophils Relative: 6 % — ABNORMAL HIGH (ref 0–5)
HCT: 42.2 % (ref 39.0–52.0)
HEMOGLOBIN: 13.9 g/dL (ref 13.0–17.0)
Lymphocytes Relative: 49 % — ABNORMAL HIGH (ref 12–46)
Lymphs Abs: 2.8 10*3/uL (ref 0.7–4.0)
MCH: 31.4 pg (ref 26.0–34.0)
MCHC: 32.9 g/dL (ref 30.0–36.0)
MCV: 95.3 fL (ref 78.0–100.0)
Monocytes Absolute: 0.4 10*3/uL (ref 0.1–1.0)
Monocytes Relative: 7 % (ref 3–12)
NEUTROS ABS: 2.1 10*3/uL (ref 1.7–7.7)
NEUTROS PCT: 37 % — AB (ref 43–77)
Platelets: 204 10*3/uL (ref 150–400)
RBC: 4.43 MIL/uL (ref 4.22–5.81)
RDW: 13.6 % (ref 11.5–15.5)
WBC: 5.7 10*3/uL (ref 4.0–10.5)

## 2014-07-17 LAB — RAPID URINE DRUG SCREEN, HOSP PERFORMED
Amphetamines: NOT DETECTED
BENZODIAZEPINES: NOT DETECTED
Barbiturates: NOT DETECTED
COCAINE: NOT DETECTED
OPIATES: NOT DETECTED
Tetrahydrocannabinol: NOT DETECTED

## 2014-07-17 LAB — I-STAT CHEM 8, ED
BUN: <3 mg/dL — ABNORMAL LOW (ref 6–23)
CHLORIDE: 104 meq/L (ref 96–112)
Calcium, Ion: 1.15 mmol/L (ref 1.12–1.23)
Creatinine, Ser: 1.4 mg/dL — ABNORMAL HIGH (ref 0.50–1.35)
Glucose, Bld: 101 mg/dL — ABNORMAL HIGH (ref 70–99)
HEMATOCRIT: 47 % (ref 39.0–52.0)
Hemoglobin: 16 g/dL (ref 13.0–17.0)
POTASSIUM: 3.7 meq/L (ref 3.7–5.3)
Sodium: 145 mEq/L (ref 137–147)
TCO2: 26 mmol/L (ref 0–100)

## 2014-07-17 LAB — ETHANOL: ALCOHOL ETHYL (B): 420 mg/dL — AB (ref 0–11)

## 2014-07-17 MED ORDER — LORAZEPAM 2 MG/ML IJ SOLN
1.0000 mg | Freq: Once | INTRAMUSCULAR | Status: AC
  Administered 2014-07-17: 1 mg via INTRAVENOUS
  Filled 2014-07-17: qty 1

## 2014-07-17 NOTE — ED Provider Notes (Addendum)
CSN: [id number]     Arrival date & time 07/17/14  2047 History   First MD Initiated Contact with Patient 07/17/14 2049     Chief Complaint  Patient presents with  . Seizures     (Consider location/radiation/quality/duration/timing/severity/associated sxs/prior Treatment) HPI Comments: ETOH abuse has not taken Dilantin "in a while"  Has frequent ED evaluation for same  Lives at home with family  Unknown seizure duration or frequency denies any injury  Can't recall last ETOH use   The history is provided by the patient.    Past Medical History  Diagnosis Date  . ETOH abuse   . 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 Hypertension   . Alcohol related seizure     h/o withdrawal sz/notes 04/17/2013   Past Surgical History  Procedure Laterality Date  . No past surgeries     History reviewed. No pertinent family history. History  Substance Use Topics  . Smoking status: Current Every Day Smoker -- 0.50 packs/day for 30 years    Types: Cigarettes    Last Attempt to Quit: 01/22/2013  . Smokeless tobacco: Never Used  . Alcohol Use: Yes     Comment: 04/17/2013 "2 1/5th/day shared w/4-5 people"    Review of Systems  Constitutional: Negative for fever.  Respiratory: Negative for shortness of breath.   Cardiovascular: Negative for chest pain.  Musculoskeletal: Positive for arthralgias.  Neurological: Positive for seizures. Negative for dizziness and headaches.  All other systems reviewed and are negative.     Allergies  Review of patient's allergies indicates no known allergies.  Home Medications   Prior to Admission medications   Medication Sig Start Date End Date Taking? Authorizing Provider  phenytoin (DILANTIN) 100 MG ER capsule Take 3 capsules (300 mg total) by mouth at bedtime. 07/18/14   Arman FilterGail K Corwin Kuiken, NP   BP 101/63 mmHg  Pulse 80  Resp 23  SpO2 100% Physical Exam  Constitutional: He appears  well-developed and well-nourished.  HENT:  Head: Normocephalic.  Eyes: Pupils are equal, round, and reactive to light.  Neck: Normal range of motion.  Cardiovascular: Normal rate and regular rhythm.   Pulmonary/Chest: Effort normal and breath sounds normal.  Abdominal: Soft.  Musculoskeletal: He exhibits tenderness.  Posterior thigh tender without bruising deformity   Skin: Skin is warm.  Nursing note and vitals reviewed.   ED Course  Procedures (including critical care time) Labs Review Labs Reviewed  CBC WITH DIFFERENTIAL - Abnormal; Notable for the following:    Neutrophils Relative % 37 (*)    Lymphocytes Relative 49 (*)    Eosinophils Relative 6 (*)    All other components within normal limits  ETHANOL - Abnormal; Notable for the following:    Alcohol, Ethyl (B) 420 (*)    All other components within normal limits  I-STAT CHEM 8, ED - Abnormal; Notable for the following:    BUN <3 (*)    Creatinine, Ser 1.40 (*)    Glucose, Bld 101 (*)    All other components within normal limits  URINE RAPID DRUG SCREEN (HOSP PERFORMED)    Imaging Review No results found.   EKG Interpretation None     Will give first dose of Dilantin in ED and Rx fro 300 mg daily patient encouraged to take on a regular basis  MDM   Final diagnoses:  Alcohol intoxication, uncomplicated  H/O medication noncompliance  Seizure          Arman FilterGail K Precious Segall, NP 07/18/14 [id number]  Elwin MochaBlair Walden, MD 07/19/14 [id number]  Arman FilterGail K Tou Hayner, NP 08/13/14 2003  Elwin MochaBlair Walden, MD 08/13/14 2351  Earley FavorGail Mel Tadros, NP 12/09/14 [id number]  Earley FavorGail Erendida Wrenn, NP 12/17/14 [id number]  Earley FavorGail Apryle Stowell, NP 12/21/14 2016  Eber HongBrian Miller, MD 12/22/14 1042  Earley FavorGail Amarilis Belflower, NP 01/28/15 [id number]  Eber HongBrian Miller, MD 01/28/15 2202

## 2014-07-17 NOTE — ED Notes (Signed)
MD aware of alcohol level

## 2014-07-17 NOTE — ED Notes (Signed)
Per EMS, pt comes from home with a seizure. Pt did not hit head or no LOC. Pt A&OX4,  NAD noted. Pt had been drinking alcohol, family not sure how much he drank. VSS, 12 lead unremarkable, 20g IV placed in L hand.

## 2014-07-18 MED ORDER — PHENYTOIN SODIUM EXTENDED 100 MG PO CAPS
400.0000 mg | ORAL_CAPSULE | Freq: Once | ORAL | Status: AC
  Administered 2014-07-18: 400 mg via ORAL
  Filled 2014-07-18: qty 4

## 2014-07-18 MED ORDER — PHENYTOIN SODIUM EXTENDED 100 MG PO CAPS: 300.0000 mg | ORAL_CAPSULE | Freq: Every day | ORAL | Status: DC

## 2014-07-18 NOTE — ED Notes (Signed)
Pt was able to walk with no complaints

## 2014-07-18 NOTE — Discharge Instructions (Signed)
Alcohol Intoxication Alcohol intoxication occurs when the amount of alcohol that a person has consumed impairs his or her ability to mentally and physically function. Alcohol directly impairs the normal chemical activity of the brain. Drinking large amounts of alcohol can lead to changes in mental function and behavior, and it can cause many physical effects that can be harmful.  Alcohol intoxication can range in severity from mild to very severe. Various factors can affect the level of intoxication that occurs, such as the person's age, gender, weight, frequency of alcohol consumption, and the presence of other medical conditions (such as diabetes, seizures, or heart conditions). Dangerous levels of alcohol intoxication may occur when people drink large amounts of alcohol in a short period (binge drinking). Alcohol can also be especially dangerous when combined with certain prescription medicines or "recreational" drugs. SIGNS AND SYMPTOMS Some common signs and symptoms of mild alcohol intoxication include:  Loss of coordination.  Changes in mood and behavior.  Impaired judgment.  Slurred speech. As alcohol intoxication progresses to more severe levels, other signs and symptoms will appear. These may include:  Vomiting.  Confusion and impaired memory.  Slowed breathing.  Seizures.  Loss of consciousness. DIAGNOSIS  Your health care provider will take a medical history and perform a physical exam. You will be asked about the amount and type of alcohol you have consumed. Blood tests will be done to measure the concentration of alcohol in your blood. In many places, your blood alcohol level must be lower than 80 mg/dL (1.61%0.08%) to legally drive. However, many dangerous effects of alcohol can occur at much lower levels.  TREATMENT  People with alcohol intoxication often do not require treatment. Most of the effects of alcohol intoxication are temporary, and they go away as the alcohol naturally  leaves the body. Your health care provider will monitor your condition until you are stable enough to go home. Fluids are sometimes given through an IV access tube to help prevent dehydration.  HOME CARE INSTRUCTIONS  Do not drive after drinking alcohol.  Stay hydrated. Drink enough water and fluids to keep your urine clear or pale yellow. Avoid caffeine.   Only take over-the-counter or prescription medicines as directed by your health care provider.  SEEK MEDICAL CARE IF:   You have persistent vomiting.   You do not feel better after a few days.  You have frequent alcohol intoxication. Your health care provider can help determine if you should see a substance use treatment counselor. SEEK IMMEDIATE MEDICAL CARE IF:   You become shaky or tremble when you try to stop drinking.   You shake uncontrollably (seizure).   You throw up (vomit) blood. This may be bright red or may look like black coffee grounds.   You have blood in your stool. This may be bright red or may appear as a black, tarry, bad smelling stool.   You become lightheaded or faint.  MAKE SURE YOU:   Understand these instructions.  Will watch your condition.  Will get help right away if you are not doing well or get worse. Document Released: 05/20/2005 Document Revised: 04/12/2013 Document Reviewed: 01/13/2013 [facility] Patient Information 2015 [place]. This information is not intended to replace advice given to you by your health care provider. Make sure you discuss any questions you have with your health care provider. It is important that you take your medication on a regular basis alcohol only makes your seizure episodes worse.  Please try to get some help with  your alcohol addiction      Emergency Department Resource Guide 1) Find a Doctor and Pay Out of Pocket Although you won't have to find out who is covered by your insurance plan, it is a good idea to ask around and get recommendations.  You will then need to call the office and see if the doctor you have chosen will accept you as a new patient and what types of options they offer for patients who are self-pay. Some doctors offer discounts or will set up payment plans for their patients who do not have insurance, but you will need to ask so you aren't surprised when you get to your appointment.  2) Contact Your Local Health Department Not all health departments have doctors that can see patients for sick visits, but many do, so it is worth a call to see if yours does. If you don't know where your local health department is, you can check in your phone book. The CDC also has a tool to help you locate your state's health department, and many state websites also have listings of all of their local health departments.  3) Find a Walk-in Clinic If your illness is not likely to be very severe or complicated, you may want to try a walk in clinic. These are popping up all over the country in pharmacies, drugstores, and shopping centers. They're usually staffed by nurse practitioners or physician assistants that have been trained to treat common illnesses and complaints. They're usually fairly quick and inexpensive. However, if you have serious medical issues or chronic medical problems, these are probably not your best option.  No Primary Care Doctor: - Call Health Connect at  [phone] - they can help you locate a primary care doctor that  accepts your insurance, provides certain services, etc. - Physician Referral Service- [phone]  Chronic Pain Problems: Organization         Address  Phone   Notes  [facility]  [phone] Patients need to be referred by their primary care doctor.   Medication Assistance: Organization         Address  Phone   Notes  [facility] [address] [place] [place], [place] [address] [phone] --Must be a resident of [facility] --  Must have NO insurance coverage whatsoever (no Medicaid/ Medicare, etc.) -- The pt. MUST have a primary care doctor that directs their care regularly and follows them in the community   [facility]  [phone]   [employer]  [phone]    Agencies that provide inexpensive medical care: Organization         Address  Phone   Notes  [facility]  [phone]   [facility]    [phone]   [facility] [address] [place], [place] [address] [phone]   [facility] [address] [place] [phone]   Planned Parenthood    [phone]   [facility]    [phone]   [facility]  [address], [place] Phone:  [phone], Fax:  [phone] Hours of Operation:  9 am - 6 pm, M-F.  Also accepts Medicaid/Medicare and self-pay.  [facility]  [address], [place] Phone: [phone], Fax: [phone]. Hours of Operation:  8:30 am - 5:30 pm, M-F.  Also  accepts Medicaid and self-pay.  [facility] [place] [address] [place] Phone: [phone]   [facility] [address] Natasha Bence [place], [place] [phone], Ext. [phone] Mondays & Thursdays: 7-9 AM.  First 15 patients are seen on a first come, first serve basis.    Medicaid-accepting Plastic And Reconstructive Surgeons Providers:  Organization         Address  Phone   Notes  [facility] [address], [place] [phone] Also accepts self-pay patients.  [facility] [address] Laurell Josephs [place], [place]  [phone]   [facility] [address], [place] [phone]   [facility] [address], [place] [phone]   Renaye Rakers [address], [place]   [phone] Only accepts [place] Access [place] patients  after they have their name applied to their card.   Self-Pay (no insurance) in [facility]:  Organization         Address  Phone   Notes  Sickle Cell Patients, [facility] [address] [place], [place] [phone]   [facility] [address] [place], [place] [phone]   [facility] [place]  [address],  [phone]   [facility]/Dr. Osei-Bonsu  [address], [place] or [address], [place] [phone] Phone number for both [place] and [place] locations is the same.  [facility] [address], [place] [phone]   [facility] [address] [place] or [address] [phone]   [facility] [address] [place] [phone], phone; [phone], fax Sees patients 1st and 3rd Saturday of every month.  Must not qualify for public or private insurance (i.e. Medicaid, Medicare, [place] Health Choice, Veterans' Benefits)  Household income should be no more than 200% of the poverty level The clinic cannot treat you if you are pregnant or think you are pregnant  Sexually transmitted diseases are not treated at the clinic.    Dental Care: Organization         Address  Phone  Notes  [facility] [address] [place], [place] [phone] Accepts children up to age 32 who are enrolled in [place] or [place] Health Choice; pregnant women with a Medicaid card; and children who have applied for Medicaid or [place] Health Choice, but were declined, whose parents can pay a reduced fee at time of service.  [facility]  [address], [place] [phone] Accepts children up to age 36 who are enrolled in [place] or [place] Health Choice; pregnant women with a Medicaid card; and children  who have applied for Medicaid or [place] Health Choice, but were declined, whose parents can pay a reduced fee at time of service.  [facility]  [address] [place], [place] [phone] Patients are seen by appointment only. Walk-ins are not accepted. [facility] will see patients 43 years of age and older. Monday - Tuesday (8am-5pm) Most Wednesdays (8:30-5pm) $30 per visit, cash only  [facility]  [address], [facility] [phone] Patients are seen by appointment only. Walk-ins are not accepted. [facility] will  see patients 52 years of age and older. One Wednesday Evening (Monthly: Volunteer Based).  $30 per visit, cash only  [employer] [facility]  [phone] for adults; Children under age 134, call Graduate Pediatric Dentistry at [phone]. Children aged 634-14, please call [phone] to request a pediatric application.  Dental services are provided in all areas of dental care including fillings, crowns and bridges, complete and partial dentures, implants, gum treatment, root canals, and extractions. Preventive care is also provided. Treatment is provided to both adults and children. Patients are selected via a lottery and there is often a waiting list.   [facility] [address], [place]  [phone] [email or web address]   [facility] [address], [place], [place] [phone], Ext. [phone] Second and Fourth Thursday of each month, opens at 6:30 AM; Clinic ends at 9 AM.  Patients are seen on a first-come first-served basis, and a limited number are seen during each clinic.   [facility]  [address], [place], [place] [phone]   Eligibility Requirements You must have lived in [place], [place], or [place] counties for at least the last three months.   You cannot be eligible for state or federal sponsored [place] Cityhealthcare insurance, including [employer]eter[employer]  Administration, [place]icaid, or [employer] EntertainmentMedicare.   You generally cannot be eligible for healthcare insurance through your employer.    How to apply: Eligibility screenings are held every Tuesday and Wednesday afternoon from 1:00 pm until 4:00 pm. You do not need an appointment for the interview!  [facility] [address], [place], [place] [phone]   [facility]  [phone]   [facility]  [phone]   [facility]  [phone]    Behavioral Health Resources in the Community: Intensive Outpatient Programs Organization         Address  Phone  Notes  [facility] [address], [place], [place] [phone]   [facility] [address], [place], [place] [phone]   [facility] [address], [place], [place]  [phone]   [facility] [address],  [place], [place] [phone] or [phone]   Substance Abuse Resources Organization         Address  Phone  Notes  [facility]  [phone]   [facility]  [phone]   [facility]  [phone]   [facility]  [phone]   Residential & Outpatient Substance Abuse Program  [phone]   Psychological Services Organization         Address  Phone  Notes  [facility]  [phone]- [phone]   [facility]  [phone]- [phone]   [facility] [address], [place] [phone] or [phone]    Mobile Crisis Teams Organization         Address  Phone  Notes  [facility], Mobile Crisis Care Unit  [phone]   [facility]  [address] [place], [place] [phone]   Doristine LocksSharon DeEsch [address] [place] [phone]    Self-Help/Support Groups Organization         Address  Phone             Notes  [facility] [place] -  variety of support groups  [phone] Call for more information  Narcotics Anonymous (NA), [facility] [address],  [place]  2 meetings at this location   Residential Treatment Programs Organization         Address  Phone  Notes  [facility] [address]    [place]  [phone]   [facility]  [address], [place] [address], [place], [place] [phone]   [facility] [address] [place], [place] [phone] Admissions: 8am-3pm M-F  [facility] [address],    [place], [place] [phone]   [facility] [address] [place], [place], [place] [phone]   [facility] [address],  [place], [place] [phone]   [facility] - Intensive Outpatient [address] Laurell Josephs [address], [place], [place] [phone]   [facility] ([facility] [address]  [place], [place] [phone] or [phone]   [facility] ([facility]) [address], [place], [place] [phone] Accepts Medicaid  [facility] [address],  [place] [phone] Substance Abuse/Addiction Treatment   [facility] Organization         Address  Phone  Notes  [facility]  [phone]   Angie Fava, PhD [address] [name] Knack [place], [place]   [phone] or [phone]   [facility]   [address] [place], [place] [phone]   [facility] [address], [place], [place] [phone] Insurance/Medicaid/sponsorship through [facility] [address]                                    [place], [place] [phone] Therapy/tele-psych/case  [facility] [address], [place] [phone]    Dr. Lolly Mustache  [phone]   [facility]  [employer] [facility] Dept. 1) [address], [place] 2) [address], [place] 3)  [address], [place] [phone]  [phone]   [facility] Child Abuse Hotline [phone] or [phone] (After Hours)

## 2014-09-20 IMAGING — CT CT HEAD W/O CM
2 series · 16 of 30 positions shown, 20 images · non-contrast
Comparison: 02/09/2013.

CLINICAL DATA: New onset seizure.

CT HEAD WITHOUT CONTRAST
TECHNIQUE: Contiguous axial images were obtained from the base of
the skull through the vertex without contrast.

[Series 2: head w/o · axial · non-contrast · 0.49mm/px · z∈[+95,+230]mm · 13 of 33 slices shown, 17 images]
[im 3/33  brain]
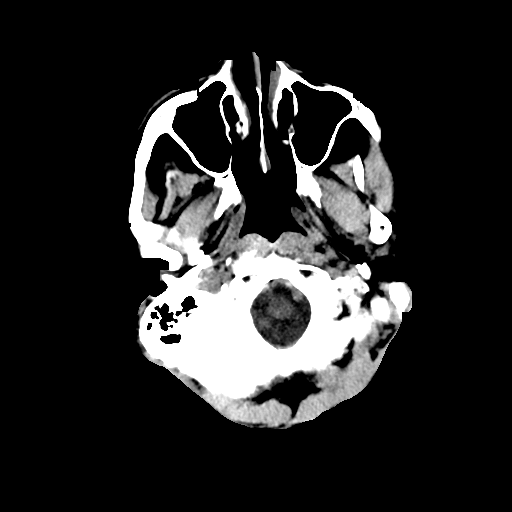
[im 3/33  bone]
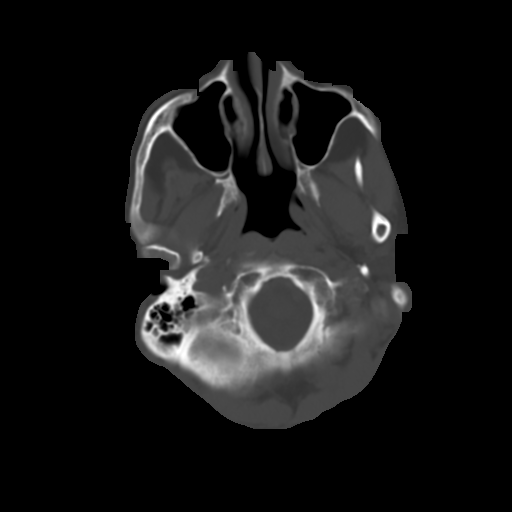
[im 5/33  brain]
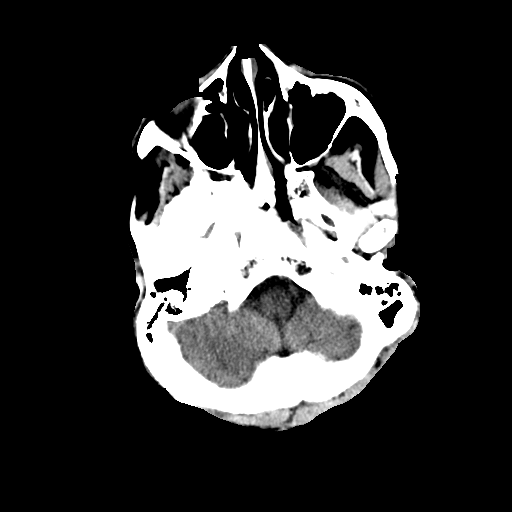
[im 7/33  brain]
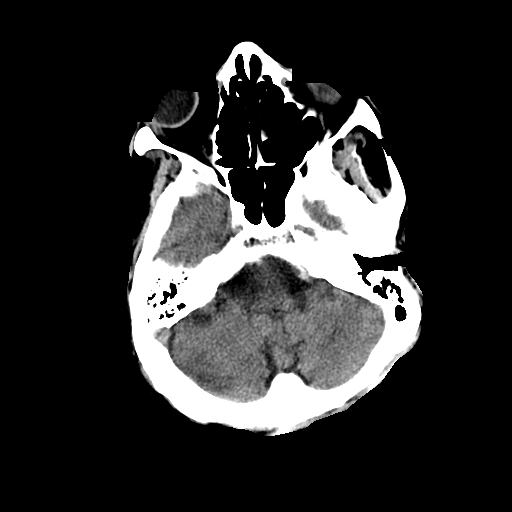
[im 10/33  brain]
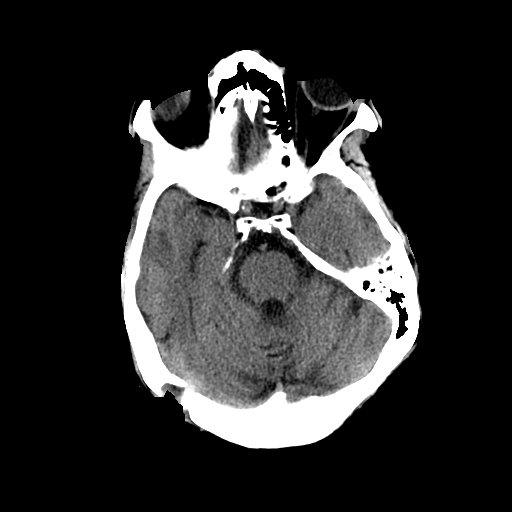
[im 12/33  brain]
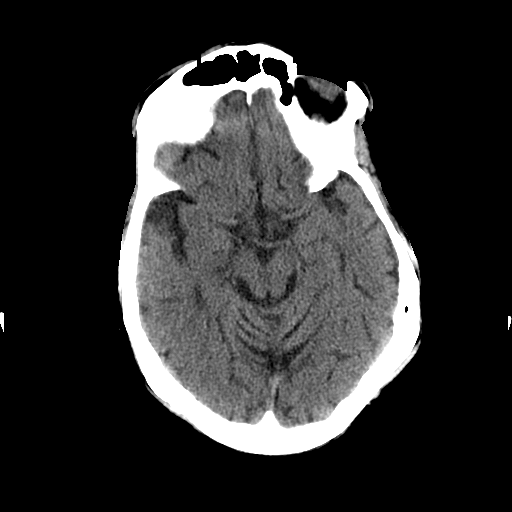
[im 12/33  bone]
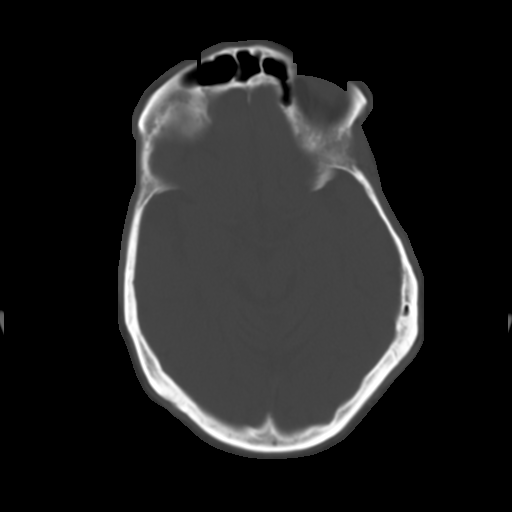
[im 14/33  brain]
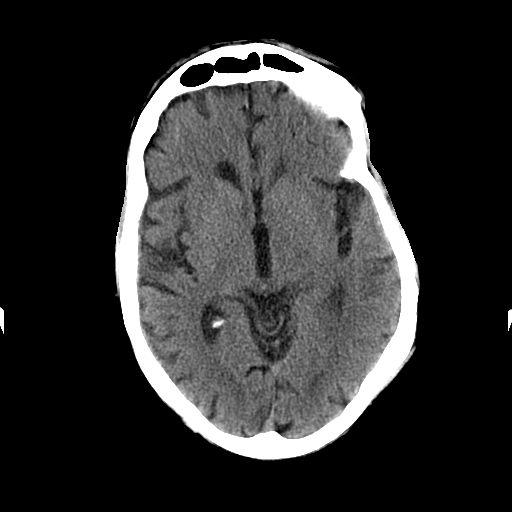
[im 17/33  brain]
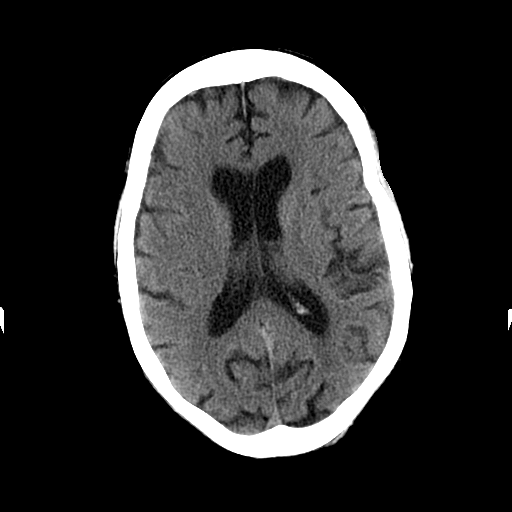
[im 19/33  brain]
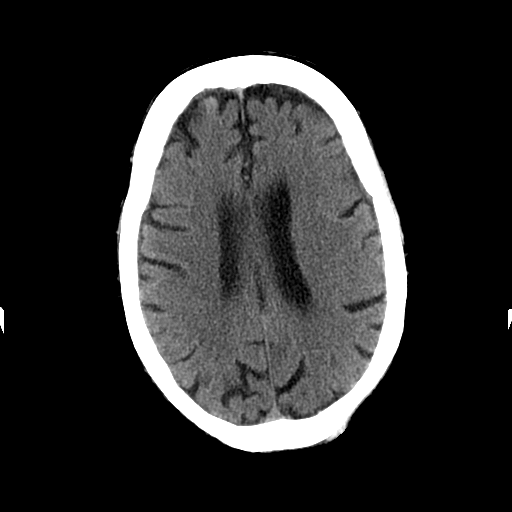
[im 21/33  brain]
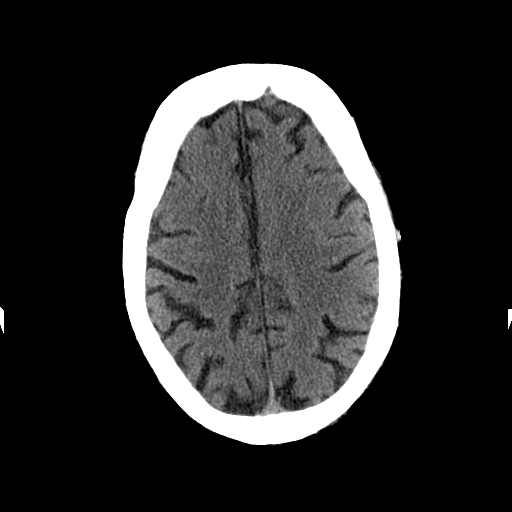
[im 21/33  bone]
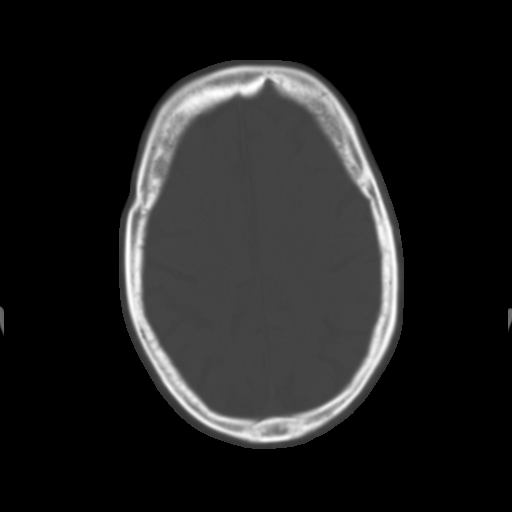
[im 23/33  brain]
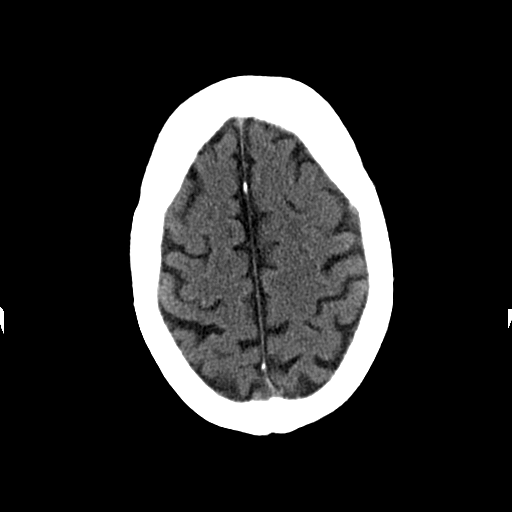
[im 26/33  brain]
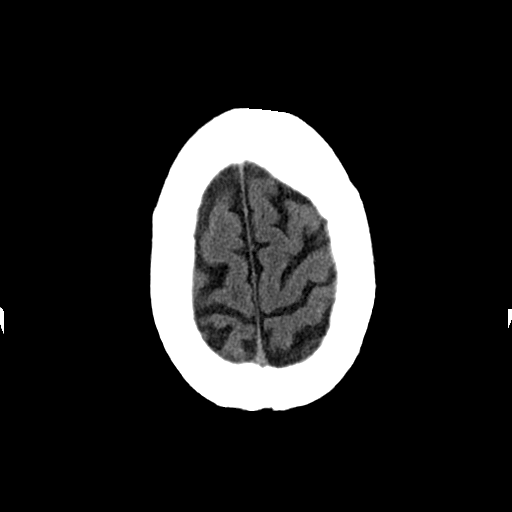
[im 28/33  brain]
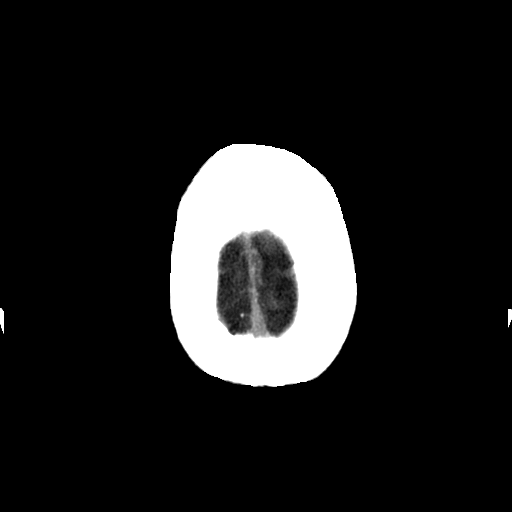
[im 30/33  brain]
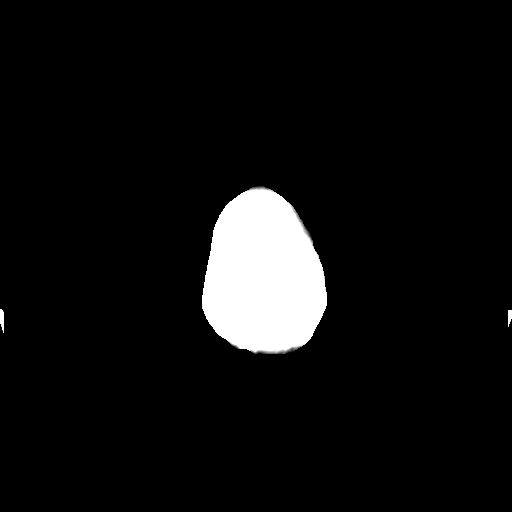
[im 30/33  bone]
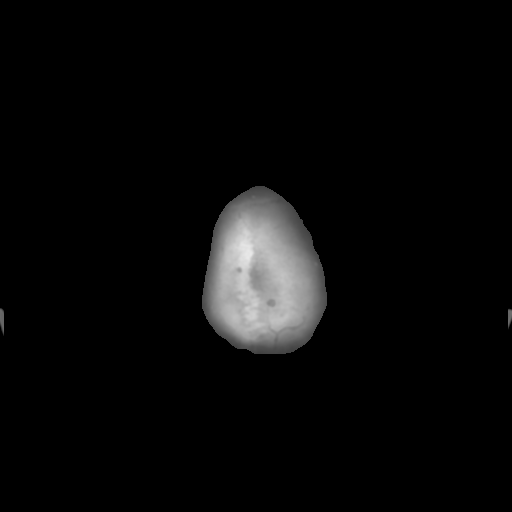

[Series 3: head w/o bone · axial · non-contrast · 0.49mm/px · z∈[+95,+140]mm · 3 of 33 slices shown]
[im 3/33  bone]
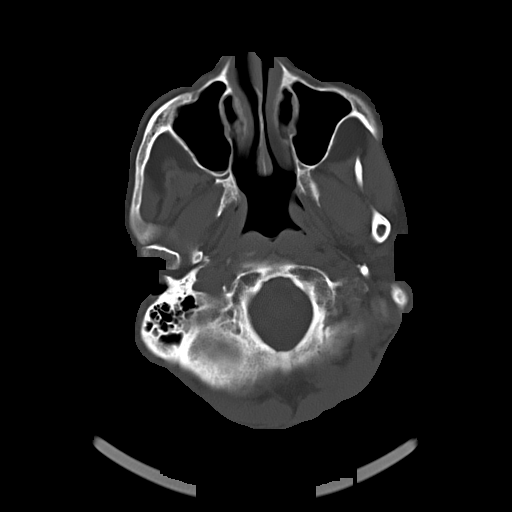
[im 7/33  bone]
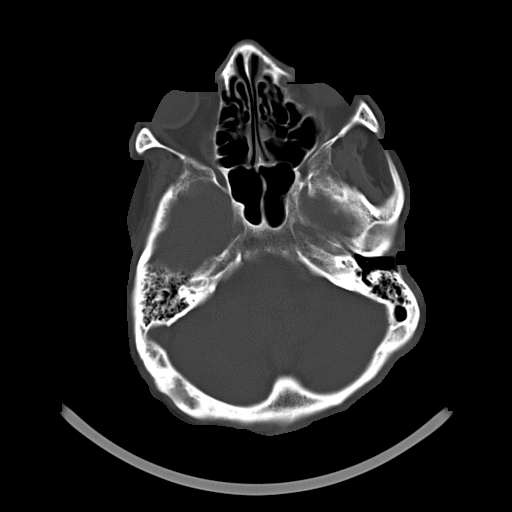
[im 12/33  bone]
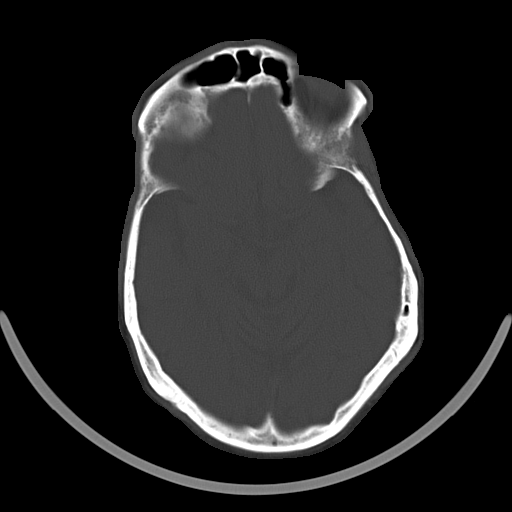

[16 of 30 positions shown; findings below may reference images not displayed]

FINDINGS: Skull:Long standing, static lucent lesion in the diploic space of
the right lateral occipital bone, 2.8 cm in length by 1.2 cm in
thickness.  There is smooth erosion of the inner and outer tables.
Favor inclusion cyst (such as epidermoid) or large arachnoid
granulation.

Orbits: Disconjugate gaze, has been reported previously.

Brain: No evidence of acute abnormality, such as acute infarction,
hemorrhage, hydrocephalus, or mass lesion/mass effect.Cerebral and
cerebellar atrophy.
IMPRESSION: 1.  No evidence of acute intracranial disease.

2.  Brain atrophy.

## 2014-11-11 ENCOUNTER — Encounter (HOSPITAL_COMMUNITY): Payer: Self-pay | Admitting: Family Medicine

## 2014-11-11 ENCOUNTER — Emergency Department (HOSPITAL_COMMUNITY): Payer: Self-pay

## 2014-11-11 ENCOUNTER — Emergency Department (HOSPITAL_COMMUNITY)
Admission: EM | Admit: 2014-11-11 | Discharge: 2014-11-11 | Disposition: A | Discharge status: Home / Self Care | Payer: Self-pay | Attending: Emergency Medicine | Admitting: Emergency Medicine

## 2014-11-11 DIAGNOSIS — G40909 Epilepsy, unspecified, not intractable, without status epilepticus: Secondary | ICD-10-CM | POA: Insufficient documentation

## 2014-11-11 DIAGNOSIS — F1010 Alcohol abuse, uncomplicated: Secondary | ICD-10-CM | POA: Insufficient documentation

## 2014-11-11 DIAGNOSIS — I10 Essential (primary) hypertension: Secondary | ICD-10-CM | POA: Insufficient documentation

## 2014-11-11 DIAGNOSIS — Z720 Tobacco use: Secondary | ICD-10-CM | POA: Insufficient documentation

## 2014-11-11 DIAGNOSIS — Z87720 Personal history of (corrected) congenital malformations of eye: Secondary | ICD-10-CM | POA: Insufficient documentation

## 2014-11-11 LAB — CBC WITH DIFFERENTIAL/PLATELET
BASOS ABS: 0 10*3/uL (ref 0.0–0.1)
Basophils Relative: 1 % (ref 0–1)
EOS ABS: 0.2 10*3/uL (ref 0.0–0.7)
EOS PCT: 4 % (ref 0–5)
HCT: 46.1 % (ref 39.0–52.0)
Hemoglobin: 15.6 g/dL (ref 13.0–17.0)
LYMPHS ABS: 2.2 10*3/uL (ref 0.7–4.0)
LYMPHS PCT: 41 % (ref 12–46)
MCH: 31.5 pg (ref 26.0–34.0)
MCHC: 33.8 g/dL (ref 30.0–36.0)
MCV: 92.9 fL (ref 78.0–100.0)
MONO ABS: 0.4 10*3/uL (ref 0.1–1.0)
MONOS PCT: 7 % (ref 3–12)
Neutro Abs: 2.5 10*3/uL (ref 1.7–7.7)
Neutrophils Relative %: 47 % (ref 43–77)
PLATELETS: 120 10*3/uL — AB (ref 150–400)
RBC: 4.96 MIL/uL (ref 4.22–5.81)
RDW: 14.7 % (ref 11.5–15.5)
WBC: 5.3 10*3/uL (ref 4.0–10.5)

## 2014-11-11 LAB — COMPREHENSIVE METABOLIC PANEL
ALT: 50 U/L (ref 0–53)
AST: 111 U/L — AB (ref 0–37)
Albumin: 4.1 g/dL (ref 3.5–5.2)
Alkaline Phosphatase: 103 U/L (ref 39–117)
Anion gap: 11 (ref 5–15)
BILIRUBIN TOTAL: 0.6 mg/dL (ref 0.3–1.2)
CHLORIDE: 100 mmol/L (ref 96–112)
CO2: 30 mmol/L (ref 19–32)
CREATININE: 0.82 mg/dL (ref 0.50–1.35)
Calcium: 9.4 mg/dL (ref 8.4–10.5)
GFR calc Af Amer: >90 mL/min (ref >90)
GFR calc non Af Amer: >90 mL/min (ref >90)
Glucose, Bld: 87 mg/dL (ref 70–99)
Potassium: 3.8 mmol/L (ref 3.5–5.1)
Sodium: 141 mmol/L (ref 135–145)
TOTAL PROTEIN: 8.3 g/dL (ref 6.0–8.3)

## 2014-11-11 LAB — ETHANOL: ALCOHOL ETHYL (B): 458 mg/dL — AB (ref 0–9)

## 2014-11-11 MED ORDER — SODIUM CHLORIDE 0.9 % IV BOLUS (SEPSIS)
2000.0000 mL | Freq: Once | INTRAVENOUS | Status: AC
  Administered 2014-11-11: 2000 mL via INTRAVENOUS

## 2014-11-11 MED ORDER — SODIUM CHLORIDE 0.9 % IV SOLN
1000.0000 mg | Freq: Once | INTRAVENOUS | Status: AC
  Administered 2014-11-11: 1000 mg via INTRAVENOUS
  Filled 2014-11-11: qty 20

## 2014-11-11 MED ORDER — PHENYTOIN SODIUM EXTENDED 100 MG PO CAPS: 300.0000 mg | ORAL_CAPSULE | Freq: Every day | ORAL | Status: DC

## 2014-11-11 NOTE — ED Notes (Signed)
Family contacted and informed of pt. Discharge. Family trying to find someone to come and get pt.

## 2014-11-11 NOTE — ED Provider Notes (Signed)
CSN: [id number]     Arrival date & time 11/11/14  1600 History   First MD Initiated Contact with Patient 11/11/14 1600     Chief Complaint  Patient presents with  . Seizures     (Consider location/radiation/quality/duration/timing/severity/associated sxs/prior Treatment) Patient is a 54 y.o. male presenting with seizures. The history is provided by the patient.  Seizures Seizure activity on arrival: no   Seizure type:  Grand mal Preceding symptoms: no headache and no numbness   Initial focality:  Diffuse Episode characteristics: disorientation, generalized shaking and unresponsiveness   Episode characteristics: no abnormal movements   Postictal symptoms: confusion   Return to baseline: yes   Severity:  Moderate Duration:  2 minutes Timing:  Once Progression:  Resolved Recent head injury:  No recent head injuries PTA treatment:  None History of seizures: yes     Past Medical History  Diagnosis Date  . ETOH abuse   . 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 Hypertension   . Alcohol related seizure     h/o withdrawal sz/notes 04/17/2013   Past Surgical History  Procedure Laterality Date  . No past surgeries     No family history on file. History  Substance Use Topics  . Smoking status: Current Every Day Smoker -- 0.50 packs/day for 30 years    Types: Cigarettes    Last Attempt to Quit: 01/22/2013  . Smokeless tobacco: Never Used  . Alcohol Use: Yes     Comment: 04/17/2013 "2 1/5th/day shared w/4-5 people"    Review of Systems  Neurological: Positive for seizures.  All other systems reviewed and are negative.     Allergies  Review of patient's allergies indicates no known allergies.  Home Medications   Prior to Admission medications   Medication Sig Start Date End Date Taking? Authorizing Provider  phenytoin (DILANTIN) 100 MG ER capsule Take 3 capsules (300 mg total) by mouth at bedtime.  11/11/14   Dorna LeitzAlex Samari Gorby, MD   BP 139/95 mmHg  Pulse 94  Temp(Src) 97.8 F (36.6 C) (Oral)  Resp 24  SpO2 98% Physical Exam  Constitutional: He is oriented to person, place, and time. He appears well-developed and well-nourished. No distress.  Moderately intoxicated  HENT:  Head: Normocephalic.  Mouth/Throat: Oropharynx is clear and moist. No oropharyngeal exudate.  Small abrasion to parietal scalp  Eyes: Conjunctivae and EOM are normal. Pupils are equal, round, and reactive to light.  Neck: Normal range of motion. Neck supple.  Cardiovascular: Normal rate, regular rhythm, normal heart sounds and intact distal pulses.  Exam reveals no gallop and no friction rub.   No murmur heard. Pulmonary/Chest: Effort normal and breath sounds normal. No respiratory distress. He has no wheezes. He has no rales.  Abdominal: Soft. He exhibits no distension and no mass. There is no tenderness. There is no rebound and no guarding.  Musculoskeletal: Normal range of motion. He exhibits no edema or tenderness.  Lymphadenopathy:    He has no cervical adenopathy.  Neurological: He is alert and oriented to person, place, and time. He has normal strength. No cranial nerve deficit or sensory deficit.  Skin: Skin is warm and dry. No rash noted. He is not diaphoretic.  Psychiatric: He has a normal mood and affect. His behavior is normal. Judgment and thought content normal.  Nursing note and vitals reviewed.   ED Course  Procedures (including critical care time) Labs  Review Labs Reviewed  CBC WITH DIFFERENTIAL/PLATELET - Abnormal; Notable for the following:    Platelets 120 (*)    All other components within normal limits  COMPREHENSIVE METABOLIC PANEL - Abnormal; Notable for the following:    BUN <5 (*)    AST 111 (*)    All other components within normal limits  ETHANOL - Abnormal; Notable for the following:    Alcohol, Ethyl (B) 458 (*)    All other components within normal limits    Imaging  Review Dg Cervical Spine Complete  11/11/2014   CLINICAL DATA:  Head injury secondary to a seizure and subsequent fall.  EXAM: CERVICAL SPINE  4+ VIEWS  COMPARISON:  None.  FINDINGS: There is no fracture or subluxation or prevertebral soft tissue swelling. Slight degenerative changes at the C5-6 disc space. Extensive arterial calcification is noted in the neck. No foraminal stenosis or facet arthritis.  IMPRESSION: No significant abnormality of the cervical spine.   Electronically Signed   By: Francene Boyers M.D.   On: 11/11/2014 17:35   Ct Head Wo Contrast  11/11/2014   CLINICAL DATA:  Witnessed seizure, EtOH  EXAM: CT HEAD WITHOUT CONTRAST  TECHNIQUE: Contiguous axial images were obtained from the base of the skull through the vertex without intravenous contrast.  COMPARISON:  11/05/2013  FINDINGS: No evidence of parenchymal hemorrhage or extra-axial fluid collection. No mass lesion, mass effect, or midline shift.  No CT evidence of acute infarction.  Mild cortical atrophy.  No ventriculomegaly.  Partial opacification of the bilateral ethmoid sinuses. Mild mucosal thickening of the right maxillary sinus. Mastoid air cells are clear.  Stable lytic lesion in the right occipital bone (series 202/ image 24). No associated extraosseous mass.  No evidence of calvarial fracture.  IMPRESSION: No evidence of acute intracranial abnormality.  Mild cortical atrophy.   Electronically Signed   By: Charline Bills M.D.   On: 11/11/2014 17:37     EKG Interpretation   Date/Time:   year old male with history of alcohol abuse and seizure disorder presents with seizure. Witnesses seizure with generalized tonic-clonic activity. He has not been taking Dilantin since his last hospital visit in November. He endorses drinking but will not quantify how much, just that it was "a lot".  Moderately intoxicated but protecting his airway and without focal pain. Fluids, basic labs, CT head given trauma above the head noted. Will load with Dilantin and DC with prescription for the same when sober.  I'll call significantly elevated but clinically it is continuing to metabolize. CT scan was negative. Discussed need for continued eyewitnesses as well as cessation of alcohol use. PCP follow-up. Repeat Dilantin prescription written.  Dorna Leitz, MD 11/11/14 2109  Richardean Canal, MD 11/11/14 [phone]

## 2014-11-11 NOTE — ED Notes (Signed)
Attempted to contact family about patient discharge. No answer message left

## 2014-11-11 NOTE — ED Notes (Addendum)
Pt presents from home via [facility] with c/o seizure that lasted approx 2-3 minutes - witnessed by a friend. Pt has ETOH on board - is unsure of how much he drank today and states is not taking medication for his seizures. Pt A&Ox4 and in NAD.   CBG 109 Dr. Durward FortesNickle at bedside.

## 2014-11-11 NOTE — ED Notes (Signed)
Critical ETOH From Lab: 458 Dr. Durward FortesNickle made aware.

## 2014-11-11 NOTE — ED Notes (Signed)
Pt. Left with all belongings 

## 2014-11-11 NOTE — ED Notes (Signed)
Phlebotomy at bedside.

## 2014-11-11 NOTE — ED Notes (Signed)
While assisting patient, patient began shaking body and extremities and saying "Todd Tran Help Me", pt's brother Todd John(Brian) and girlfriend at bedside stating patient is having a seizure. Pt continuing to say "Starling MannsBrian, Brian, Help me" and shaking his arms.  Pt's girlfriend stated "He threw up in his mouth". Upon suctioning mouth, clear saliva only noted.  Vital signs remained stable.  Dr. Silverio LayYao and Dr. Durward FortesNickle aware of episode.

## 2014-11-11 NOTE — Discharge Instructions (Signed)
Alcohol Use Disorder °Alcohol use disorder is a mental disorder. It is not a one-time incident of heavy drinking. Alcohol use disorder is the excessive and uncontrollable use of alcohol over time that leads to problems with functioning in one or more areas of daily living. People with this disorder risk harming themselves and others when they drink to excess. Alcohol use disorder also can cause other mental disorders, such as mood and anxiety disorders, and serious physical problems. People with alcohol use disorder often misuse other drugs.  °Alcohol use disorder is common and widespread. Some people with this disorder drink alcohol to cope with or escape from negative life events. Others drink to relieve chronic pain or symptoms of mental illness. People with a family history of alcohol use disorder are at higher risk of losing control and using alcohol to excess.  °SYMPTOMS  °Signs and symptoms of alcohol use disorder may include the following:  °· Consumption of alcohol in larger amounts or over a longer period of time than intended. °· Multiple unsuccessful attempts to cut down or control alcohol use.   °· A great deal of time spent obtaining alcohol, using alcohol, or recovering from the effects of alcohol (hangover). °· A strong desire or urge to use alcohol (cravings).   °· Continued use of alcohol despite problems at work, school, or home because of alcohol use.   °· Continued use of alcohol despite problems in relationships because of alcohol use. °· Continued use of alcohol in situations when it is physically hazardous, such as driving a car. °· Continued use of alcohol despite awareness of a physical or psychological problem that is likely related to alcohol use. Physical problems related to alcohol use can involve the brain, heart, liver, stomach, and intestines. Psychological problems related to alcohol use include intoxication, depression, anxiety, psychosis, delirium, and dementia.   °· The need for  increased amounts of alcohol to achieve the same desired effect, or a decreased effect from the consumption of the same amount of alcohol (tolerance). °· Withdrawal symptoms upon reducing or stopping alcohol use, or alcohol use to reduce or avoid withdrawal symptoms. Withdrawal symptoms include: °· Racing heart. °· Hand tremor. °· Difficulty sleeping. °· Nausea. °· Vomiting. °· Hallucinations. °· Restlessness. °· Seizures. °DIAGNOSIS °Alcohol use disorder is diagnosed through an assessment by your health care provider. Your health care provider may start by asking three or four questions to screen for excessive or problematic alcohol use. To confirm a diagnosis of alcohol use disorder, at least two symptoms must be present within a 12-month period. The severity of alcohol use disorder depends on the number of symptoms: °· Mild--two or three. °· Moderate--four or five. °· Severe--six or more. °Your health care provider may perform a physical exam or use results from lab tests to see if you have physical problems resulting from alcohol use. Your health care provider may refer you to a mental health professional for evaluation. °TREATMENT  °Some people with alcohol use disorder are able to reduce their alcohol use to low-risk levels. Some people with alcohol use disorder need to quit drinking alcohol. When necessary, mental health professionals with specialized training in substance use treatment can help. Your health care provider can help you decide how severe your alcohol use disorder is and what type of treatment you need. The following forms of treatment are available:  °· Detoxification. Detoxification involves the use of prescription medicines to prevent alcohol withdrawal symptoms in the first week after quitting. This is important for people with a history of symptoms   of withdrawal and for heavy drinkers who are likely to have withdrawal symptoms. Alcohol withdrawal can be dangerous and, in severe cases, cause  death. Detoxification is usually provided in a hospital or in-patient substance use treatment facility.  Counseling or talk therapy. Talk therapy is provided by substance use treatment counselors. It addresses the reasons people use alcohol and ways to keep them from drinking again. The goals of talk therapy are to help people with alcohol use disorder find healthy activities and ways to cope with life stress, to identify and avoid triggers for alcohol use, and to handle cravings, which can cause relapse.  Medicines.Different medicines can help treat alcohol use disorder through the following actions:  Decrease alcohol cravings.  Decrease the positive reward response felt from alcohol use.  Produce an uncomfortable physical reaction when alcohol is used (aversion therapy).  Support groups. Support groups are run by people who have quit drinking. They provide emotional support, advice, and guidance. These forms of treatment are often combined. Some people with alcohol use disorder benefit from intensive combination treatment provided by specialized substance use treatment centers. Both inpatient and outpatient treatment programs are available. Document Released: 09/17/2004 Document Revised: 12/25/2013 Document Reviewed: 11/17/2012 [facility] Patient Information 2015 [place], [place]. This information is not intended to replace advice given to you by your health care provider. Make sure you discuss any questions you have with your health care provider.  Seizure, Adult A seizure is abnormal electrical activity in the brain. Seizures usually last from 30 seconds to 2 minutes. There are various types of seizures. Before a seizure, you may have a warning sensation (aura) that a seizure is about to occur. An aura may include the following symptoms:   Fear or anxiety.  Nausea.  Feeling like the room is spinning (vertigo).  Vision changes, such as seeing flashing lights or spots. Common symptoms  during a seizure include:  A change in attention or behavior (altered mental status).  Convulsions with rhythmic jerking movements.  Drooling.  Rapid eye movements.  Grunting.  Loss of bladder and bowel control.  Bitter taste in the mouth.  Tongue biting. After a seizure, you may feel confused and sleepy. You may also have an injury resulting from convulsions during the seizure. HOME CARE INSTRUCTIONS   If you are given medicines, take them exactly as prescribed by your health care provider.  Keep all follow-up appointments as directed by your health care provider.  Do not swim or drive or engage in risky activity during which a seizure could cause further injury to you or others until your health care provider says it is OK.  Get adequate rest.  Teach friends and family what to do if you have a seizure. They should:  Lay you on the ground to prevent a fall.  Put a cushion under your head.  Loosen any tight clothing around your neck.  Turn you on your side. If vomiting occurs, this helps keep your airway clear.  Stay with you until you recover.  Know whether or not you need emergency care. SEEK IMMEDIATE MEDICAL CARE IF:  The seizure lasts longer than 5 minutes.  The seizure is severe or you do not wake up immediately after the seizure.  You have an altered mental status after the seizure.  You are having more frequent or worsening seizures. Someone should drive you to the emergency department or call local emergency services (911 in [place] MAKE SURE YOU:  Understand these instructions.  Will watch your condition.  Will get help right away if you are not doing well or get worse. Document Released: 08/07/2000 Document Revised: 05/31/2013 Document Reviewed: 03/22/2013 [facility] Patient Information 2015 [place], [place]. This information is not intended to replace advice given to you by your health care provider. Make sure you discuss any questions you have with  your health care provider.

## 2014-11-11 NOTE — ED Notes (Signed)
Family out to nurse's station to request assistance. Patient c/o left arm pain. Assessed arm and IV tubing. Arm appears WDL, IV site flushes without issue. Pt now denying arm pain.

## 2014-11-25 ENCOUNTER — Encounter (HOSPITAL_COMMUNITY): Payer: Self-pay

## 2014-11-25 ENCOUNTER — Emergency Department (HOSPITAL_COMMUNITY)
Admission: EM | Admit: 2014-11-25 | Discharge: 2014-11-26 | Disposition: A | Discharge status: Home / Self Care | Payer: Self-pay | Attending: Emergency Medicine | Admitting: Emergency Medicine

## 2014-11-25 ENCOUNTER — Emergency Department (HOSPITAL_COMMUNITY): Payer: Self-pay

## 2014-11-25 DIAGNOSIS — R569 Unspecified convulsions: Secondary | ICD-10-CM | POA: Insufficient documentation

## 2014-11-25 DIAGNOSIS — Z720 Tobacco use: Secondary | ICD-10-CM | POA: Insufficient documentation

## 2014-11-25 DIAGNOSIS — F10120 Alcohol abuse with intoxication, uncomplicated: Secondary | ICD-10-CM | POA: Insufficient documentation

## 2014-11-25 DIAGNOSIS — I10 Essential (primary) hypertension: Secondary | ICD-10-CM | POA: Insufficient documentation

## 2014-11-25 DIAGNOSIS — Z8669 Personal history of other diseases of the nervous system and sense organs: Secondary | ICD-10-CM | POA: Insufficient documentation

## 2014-11-25 DIAGNOSIS — F10920 Alcohol use, unspecified with intoxication, uncomplicated: Secondary | ICD-10-CM

## 2014-11-25 DIAGNOSIS — M25562 Pain in left knee: Secondary | ICD-10-CM | POA: Insufficient documentation

## 2014-11-25 LAB — COMPREHENSIVE METABOLIC PANEL
ALBUMIN: 3.9 g/dL (ref 3.5–5.2)
ALT: 77 U/L — AB (ref 0–53)
ANION GAP: 15 (ref 5–15)
AST: 163 U/L — AB (ref 0–37)
Alkaline Phosphatase: 123 U/L — ABNORMAL HIGH (ref 39–117)
BILIRUBIN TOTAL: 0.5 mg/dL (ref 0.3–1.2)
BUN: <5 mg/dL — ABNORMAL LOW (ref 6–23)
CO2: 23 mmol/L (ref 19–32)
CREATININE: 0.82 mg/dL (ref 0.50–1.35)
Calcium: 8.4 mg/dL (ref 8.4–10.5)
Chloride: 102 mmol/L (ref 96–112)
GFR calc non Af Amer: >90 mL/min (ref >90)
Glucose, Bld: 94 mg/dL (ref 70–99)
POTASSIUM: 4 mmol/L (ref 3.5–5.1)
SODIUM: 140 mmol/L (ref 135–145)
TOTAL PROTEIN: 8.1 g/dL (ref 6.0–8.3)

## 2014-11-25 LAB — CBC WITH DIFFERENTIAL/PLATELET
Basophils Absolute: 0.1 10*3/uL (ref 0.0–0.1)
Basophils Relative: 1 % (ref 0–1)
Eosinophils Absolute: 0.2 10*3/uL (ref 0.0–0.7)
Eosinophils Relative: 4 % (ref 0–5)
HEMATOCRIT: 43.3 % (ref 39.0–52.0)
Hemoglobin: 14.4 g/dL (ref 13.0–17.0)
LYMPHS ABS: 2.6 10*3/uL (ref 0.7–4.0)
LYMPHS PCT: 44 % (ref 12–46)
MCH: 30.8 pg (ref 26.0–34.0)
MCHC: 33.3 g/dL (ref 30.0–36.0)
MCV: 92.7 fL (ref 78.0–100.0)
Monocytes Absolute: 0.4 10*3/uL (ref 0.1–1.0)
Monocytes Relative: 6 % (ref 3–12)
NEUTROS ABS: 2.7 10*3/uL (ref 1.7–7.7)
Neutrophils Relative %: 45 % (ref 43–77)
Platelets: 207 10*3/uL (ref 150–400)
RBC: 4.67 MIL/uL (ref 4.22–5.81)
RDW: 15.2 % (ref 11.5–15.5)
WBC: 5.9 10*3/uL (ref 4.0–10.5)

## 2014-11-25 LAB — ETHANOL: ALCOHOL ETHYL (B): 457 mg/dL — AB (ref 0–9)

## 2014-11-25 LAB — CBG MONITORING, ED: Glucose-Capillary: 93 mg/dL (ref 70–99)

## 2014-11-25 MED ORDER — SODIUM CHLORIDE 0.9 % IV SOLN
INTRAVENOUS | Status: DC
  Administered 2014-11-25: 23:00:00 via INTRAVENOUS

## 2014-11-25 NOTE — ED Notes (Signed)
To triage via EMS.  Per EMS wife witnessed pt having seizure today.  Pt had beer today, shared 3 - 40's with some friends.  Pt c/o left knee pain.  Has chronic knee pian and gets injections last injection 2 weeks ago.

## 2014-11-25 NOTE — ED Notes (Signed)
Pt reports being out of Dilantin x 2 weeks.

## 2014-11-25 NOTE — Discharge Instructions (Signed)
Arthralgia Arthralgia is joint pain. A joint is a place where two bones meet. Joint pain can happen for many reasons. The joint can be bruised, stiff, infected, or weak from aging. Pain usually goes away after resting and taking medicine for soreness.  HOME CARE  Rest the joint as told by your doctor.  Keep the sore joint raised (elevated) for the first 24 hours.  Put ice on the joint area.  Put ice in a plastic bag.  Place a towel between your skin and the bag.  Leave the ice on for 15-20 minutes, 03-04 times a day.  Wear your splint, casting, elastic bandage, or sling as told by your doctor.  Only take medicine as told by your doctor. Do not take aspirin.  Use crutches as told by your doctor. Do not put weight on the joint until told to by your doctor. GET HELP RIGHT AWAY IF:   You have bruising, puffiness (swelling), or more pain.  Your fingers or toes turn blue or start to lose feeling (numb).  Your medicine does not lessen the pain.  Your pain becomes severe.  You have a temperature by mouth above 102 F (38.9 C), not controlled by medicine.  You cannot move or use the joint. MAKE SURE YOU:   Understand these instructions.  Will watch your condition.  Will get help right away if you are not doing well or get worse. Document Released: 07/29/2009 Document Revised: 11/02/2011 Document Reviewed: 07/29/2009 [facility] Patient Information 2015 [place], [place]. This information is not intended to replace advice given to you by your health care provider. Make sure you discuss any questions you have with your health care provider.  Knee Pain Knee pain can be a result of an injury or other medical conditions. Treatment will depend on the cause of your pain. HOME CARE  Only take medicine as told by your doctor.  Keep a healthy weight. Being overweight can make the knee hurt more.  Stretch before exercising or playing sports.  If there is constant knee pain, change the  way you exercise. Ask your doctor for advice.  Make sure shoes fit well. Choose the right shoe for the sport or activity.  Protect your knees. Wear kneepads if needed.  Rest when you are tired. GET HELP RIGHT AWAY IF:   Your knee pain does not stop.  Your knee pain does not get better.  Your knee joint feels hot to the touch.  You have a fever. MAKE SURE YOU:   Understand these instructions.  Will watch this condition.  Will get help right away if you are not doing well or get worse. Document Released: 11/06/2008 Document Revised: 11/02/2011 Document Reviewed: 11/06/2008 [facility] Patient Information 2015 [place], [place]. This information is not intended to replace advice given to you by your health care provider. Make sure you discuss any questions you have with your health care provider.  Seizure, Adult A seizure means there is unusual activity in the brain. A seizure can cause changes in attention or behavior. Seizures often cause shaking (convulsions). Seizures often last from 30 seconds to 2 minutes. HOME CARE   If you are given medicines, take them exactly as told by your doctor.  Keep all doctor visits as told.  Do not swim or drive until your doctor says it is okay.  Teach others what to do if you have a seizure. They should:  Lay you on the ground.  Put a cushion under your head.  Loosen any  tight clothing around your neck.  Turn you on your side.  Stay with you until you get better. GET HELP RIGHT AWAY IF:   The seizure lasts longer than 2 to 5 minutes.  The seizure is very bad.  The person does not wake up after the seizure.  The person's attention or behavior changes. Drive the person to the emergency room or call your local emergency services (911 in [place] MAKE SURE YOU:   Understand these instructions.  Will watch your condition.  Will get help right away if you are not doing well or get worse. Document Released: 01/27/2008 Document  Revised: 11/02/2011 Document Reviewed: 07/29/2011 [facility] Patient Information 2015 [place], [place]. This information is not intended to replace advice given to you by your health care provider. Make sure you discuss any questions you have with your health care provider.

## 2014-11-25 NOTE — ED Provider Notes (Signed)
CSN: [id number]     Arrival date & time 11/25/14  2044 History   First MD Initiated Contact with Patient 11/25/14 2107     Chief Complaint  Patient presents with  . Seizures  . Knee Pain     (Consider location/radiation/quality/duration/timing/severity/associated sxs/prior Treatment) HPI    PCP: No PCP Per Patient Blood pressure 134/95, pulse 94, temperature 98.2 F (36.8 C), temperature source Oral, resp. rate 17, height  (1.803 m), weight 178 lb 8 oz (80.967 kg), SpO2 100 %.  Todd Tran is a 54 y.o.male with a significant PMH of ETOH abuse, hypertension, alcohol related withdrawal seizure presents to the ER with complaints of seizure. Brought in by EMS accompanied by his wife and brother. THe patient smells of alcohol but is awake, alert and answering questions appropriately. His brother is also intoxicated and keeps asking questions loudly and answering for the patient. He was asked to please let the patient answer for himself. The patient reports drinking 40 oz beer's x 3 which he shared. He also complains of left knee pain for 3 years. The patient reports not taking his Dilantin because he can not afford it. He reports he has prescriptions for the medication but is unable to get this medication filled. He denies having any headache, neck pain, chest pain, back pain or any other complaints. He does not remember the seizure. No urine/bowel incontinence, no tongue bir. In triage the patient is afebrile with normal temp, pulse, rep rate and BP.   Past Medical History  Diagnosis Date  . ETOH abuse   . 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 Hypertension   . Alcohol related seizure     h/o withdrawal sz/notes 04/17/2013   Past Surgical History  Procedure Laterality Date  . No past surgeries     History reviewed. No pertinent family history. History  Substance Use Topics  . Smoking status: Current Every Day  Smoker -- 0.50 packs/day for 30 years    Types: Cigarettes    Last Attempt to Quit: 01/22/2013  . Smokeless tobacco: Never Used  . Alcohol Use: Yes     Comment: 04/17/2013 "2 1/5th/day shared w/4-5 people"    Review of Systems  10 Systems reviewed and are negative for acute change except as noted in the HPI.     Allergies  Review of patient's allergies indicates no known allergies.  Home Medications   Prior to Admission medications   Medication Sig Start Date End Date Taking? Authorizing Provider  phenytoin (DILANTIN) 100 MG ER capsule Take 3 capsules (300 mg total) by mouth at bedtime. Patient not taking: Reported on 11/25/2014 11/11/14   Dorna Leitz, MD   BP 134/95 mmHg  Pulse 94  Temp(Src) 98.2 F (36.8 C) (Oral)  Resp 17  Ht  (1.803 m)  Wt 178 lb 8 oz (80.967 kg)  BMI 24.91 kg/m2  SpO2 100% Physical Exam  Constitutional: He is oriented to person, place, and time. He appears well-developed and well-nourished. No distress.  HENT:  Head: Normocephalic and atraumatic.  Eyes: Pupils are equal, round, and reactive to light.  Neck: Normal range of motion. Neck supple.  Cardiovascular: Normal rate and regular rhythm.   Pulmonary/Chest: Effort normal. No respiratory distress. He has no wheezes.  Abdominal: Soft.  Musculoskeletal:       Left knee: He exhibits normal range of motion, no swelling, no ecchymosis  and no laceration. Tenderness found. Medial joint line, lateral joint line and patellar tendon tenderness noted.  Neurological: He is alert and oriented to person, place, and time.  Cranial nerves II-VIII and X-XII evaluated and show no deficits. Pt alert and oriented x 3 Upper and lower extremity strength is symmetrical and physiologic Normal muscular tone No facial droop Coordination intact, no limb ataxia,    Skin: Skin is warm and dry.  Nursing note and vitals reviewed.   ED Course  Procedures (including critical care time) Labs Review Labs Reviewed   COMPREHENSIVE METABOLIC PANEL - Abnormal; Notable for the following:    BUN <5 (*)    AST 163 (*)    ALT 77 (*)    Alkaline Phosphatase 123 (*)    All other components within normal limits  ETHANOL - Abnormal; Notable for the following:    Alcohol, Ethyl (B) 457 (*)    All other components within normal limits  CBC WITH DIFFERENTIAL/PLATELET  CBG MONITORING, ED    Imaging Review No results found.   EKG Interpretation   Date/Time:  Sunday November 25 2014 22:00:50 EDT Ventricular Rate:  94 PR Interval:  110 QRS Duration: 88 QT Interval:  347 QTC Calculation: 434 R Axis:   67 Text Interpretation:  Ectopic atrial rhythm Borderline short PR interval  No significant change since last tracing Confirmed by [facility]  MD, MARTHA  [phone]) on 11/25/2014 10:10:40 PM      MDM   Final diagnoses:  Left knee pain  Alcohol intoxication, uncomplicated  Seizure   The patient is clinically sober, able to ambulate, is awake and alert/ not slurring his words. From the minute I walked into the room the patient was asking to leave the hospital. He did not want to stay and have the work up done. The nurse convinced him to stay for work-up.   His ETOH is 457, this is his baseline ETOH as when he comes to the ED it is typically between 400-500.  CMP shows elevated ;iver enzymes bumped from his previous visit two weeks prior- likely due to alcohol intake.  Normal CBC, normal, CBG, non acute xray of the knee, EKG showed no significant change from previous EKG.  The patient after receiving his knee sleeve wanted to leave the ED again the second time. I did not get a Dilantin level since he admits to not taking his Dilantin.  I spoke with Dr. Cyril Mourningamillo regarding changing seizure medications to something more affordable but he says Dilantin is atcually the cheapest seizure medication. [facility].  I spoke with case management Burna Mortimer(Wanda) regarding  his medications and she says Dilantin is on the $4 list, [facility] if he is unable to pay this. I discussed with the patient the need for him to get his first dose of Dilantin here but his family members and the patient were trying to leave and did not want to wait for me to order this medication since i had not already ordered it.  Patient was discharged, ambulatory with steady gait and  with normal vitals signs and in no acute distress.  54 y.o.Todd Tran's evaluation in the Emergency Department is complete. It has been determined that no acute conditions requiring further emergency intervention are present at this time. The patient/guardian have been advised of the diagnosis and plan. We have discussed signs and symptoms that warrant return to  the ED, such as changes or worsening in symptoms.  Vital signs are stable at discharge. Filed Vitals:   11/25/14 2351  BP: 134/95  Pulse: 94  Temp: 98.2 F (36.8 C)  Resp: 17    Patient/guardian has voiced understanding and agreed to follow-up with the PCP or specialist.     Marlon Pel, PA-C 11/28/14 1858  Marlon Pel, PA-C 11/28/14 1858  Jerelyn Scott, MD 11/29/14 [phone]

## 2014-11-26 NOTE — ED Notes (Signed)
Pt. Left with all belongings 

## 2016-04-24 ENCOUNTER — Ambulatory Visit (INDEPENDENT_AMBULATORY_CARE_PROVIDER_SITE_OTHER): Payer: Self-pay | Admitting: Internal Medicine

## 2016-04-24 VITALS — BP 116/75 | HR 86 | Temp 98.4°F | Ht 70.5 in | Wt 157.9 lb

## 2016-04-24 DIAGNOSIS — Z7689 Persons encountering health services in other specified circumstances: Secondary | ICD-10-CM | POA: Insufficient documentation

## 2016-04-24 DIAGNOSIS — F1020 Alcohol dependence, uncomplicated: Secondary | ICD-10-CM

## 2016-04-24 DIAGNOSIS — F10220 Alcohol dependence with intoxication, uncomplicated: Secondary | ICD-10-CM

## 2016-04-24 DIAGNOSIS — R569 Unspecified convulsions: Secondary | ICD-10-CM

## 2016-04-24 DIAGNOSIS — Z0000 Encounter for general adult medical examination without abnormal findings: Secondary | ICD-10-CM

## 2016-04-24 DIAGNOSIS — Z8669 Personal history of other diseases of the nervous system and sense organs: Secondary | ICD-10-CM

## 2016-04-24 NOTE — Progress Notes (Signed)
CC: establish care  HPI:  ToddTodd Tran is a 55 y.o. with a PMH of alcohol dependence and seizures associated with alcohol withdrawal presenting to the clinic to establish care.   Todd Tran states he has not been seen by a doctor in a couple of year, and has never had a regular primary care physician who he follows up with. His last encounter with a physician was in the ED for alcohol intoxication. He denies current medical problems, denies any surgeries, he denies taking any medications (though was told to take dilantin for his seizures), and denies known drug allergies. He states that he currently drinks about three 40oz beers a day if he can afford them and states that he is trying to decrease this amount. When asked how much he used to drink, he states that it was a lot more and involved liquor, wine and beer concurrently.   He has been unemployed for several years now, due to pain in his knee and back. 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 He is currently in the process of applying for disability insurance. Since he is unemployed he states he cannot afford lab tests at this time. He is interested in pursuing obtaining the orange card.  Patient endorses chronic L knee pain and lower back pain. 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 He reports having his vision checked last month - will bring in copy of report next visit. He denies recurrent seizures after last ED visit.   Past Medical History:  Diagnosis Date  . Alcohol related seizure (HCC)    h/o withdrawal sz/notes 04/17/2013  . ETOH abuse   . 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 Hypertension     Review of Systems:   Review of Systems  Constitutional: Negative for chills, fever and weight loss.  HENT: Negative for hearing  loss.   Eyes: Positive for blurred vision. Negative for double vision and pain.  Respiratory: Negative for cough, hemoptysis, sputum production, shortness of breath and wheezing.   Cardiovascular: Negative for chest pain, palpitations and leg swelling.  Gastrointestinal: Negative for abdominal pain, blood in stool, constipation, diarrhea, heartburn, melena, nausea and vomiting.  Genitourinary: Negative for dysuria, frequency, hematuria and urgency.  Musculoskeletal: Positive for back pain and joint pain.  Skin: Negative for itching and rash.       Cuts on L leg from fall  Neurological: Positive for tremors and seizures. Negative for dizziness, tingling, sensory change and headaches.  Psychiatric/Behavioral: Positive for substance abuse.    Physical Exam:  Vitals:   04/24/16 1520  BP: 116/75  Pulse: 86  Temp: 98.4 F (36.9 C)  TempSrc: Oral  SpO2: 100%  Weight: 157 lb 14.4 oz (71.6 kg)  Height: 5' 10.5" (1.791 m)   Physical Exam  Constitutional: Vital signs are normal. He appears well-developed and well-nourished. He is cooperative. No distress.  Eyes: Right eye exhibits chemosis. Right eye exhibits no discharge. Left eye exhibits chemosis. Left eye exhibits no discharge. Right conjunctiva is injected. Left conjunctiva is injected. No scleral icterus. Right eye exhibits normal extraocular motion. Left eye exhibits normal extraocular motion. Right pupil is round and reactive. Left pupil is round and reactive. Pupils are equal.  Exophthalmos bilaterally. Exotropia on  the R.  Neck: Trachea normal and normal range of motion. No JVD present. No thyromegaly present.  Cardiovascular: Normal rate, regular rhythm and intact distal pulses.  PMI is not displaced.  Exam reveals gallop and S4.   Pulmonary/Chest: Effort normal and breath sounds normal. No respiratory distress. He has no wheezes. He has no rhonchi. He has no rales.  Abdominal: Soft. Normal appearance and bowel sounds are normal. He  exhibits distension. There is hepatomegaly. There is no tenderness.  Neurological: He is alert. He has normal strength. No cranial nerve deficit or sensory deficit.  Skin: Skin is warm and dry. Abrasion (small well healing on lateral aspect of LLE) noted. No bruising and no rash noted. He is not diaphoretic. No cyanosis. Nails show no clubbing.    Assessment & Plan:   See Encounters Tab for problem based charting.   Patient seen with Dr. Fredrich RomansMullen   Niomi Valent, MD Internal Medicine PGY1

## 2016-04-24 NOTE — Patient Instructions (Signed)
-  Please make an appointment to see our financial counselor, Todd Tran, for getting an orange card. Please bring all the required documents listed on the blue sheet to that appointment, as they are required for processing your application.  -Please make an appointment with our clinic to see your new primary care provider in about one month.  -It would be in your best interest to stop smoking; we have resources to help if you would like them.

## 2016-04-26 NOTE — Assessment & Plan Note (Signed)
Patient states he drinks about three 40oz beers a day, which is a decreased amount from what it used to be (he could not quantify past amount). He is actively trying to cut down on the amount of alcohol he consumes.  Plan --encouraged alcohol cessation --will discuss medication assisted cessation once patient obtains orange card

## 2016-04-26 NOTE — Assessment & Plan Note (Signed)
Patient has a history of alcohol withdrawal seizures with multiple visits to the ED in the past years; last visit was April 2016. He denies any seizures in the interim. He has been prescribed dilantin for seizure prevention but states he doesn't take it unless he's in the hospital due to not being able to afford it.   Plan: --Patient has been seizure free for over a year without therapy --Patient not able to afford dilantin --on f/u visit, revisit need for continued seizure prophylaxis and cost-effective options for him [facility]

## 2016-04-26 NOTE — Assessment & Plan Note (Signed)
Patient has not been seen by physician outside of ED for several years. Denies any regular primary care follow up in the past He reports he has not had any vaccinations outside the ones received in school. Denies any blood transfusions. He does not have insurance, but is interested in applying for orange card. Chart review reveals he received TDAP and pneuomococcal 23 valent vaccines in 2014. Patient states he cannot afford labwork at this time.  Plan: --Supplied patient with documents needed for orange card applications --set up appointment with financial counselor --will need HIV, HepC testing --will need CBC, CMP at the least - have standing order for when he obtains orange card --f/u in one month with PCP

## 2016-04-30 ENCOUNTER — Telehealth: Payer: Self-pay | Admitting: Internal Medicine

## 2016-04-30 NOTE — Progress Notes (Signed)
Internal Medicine Clinic Attending  I saw and evaluated the patient.  I personally confirmed the key portions of the history and exam documented by Dr. Svalina and I reviewed pertinent patient test results.  The assessment, diagnosis, and plan were formulated together and I agree with the documentation in the resident's note.  

## 2016-04-30 NOTE — Telephone Encounter (Signed)
APT. REMINDER CALL, NO ANSWER NO VOICEMAIL, TRIED BOTH NUMBERS ON ACCOUNT

## 2016-05-01 ENCOUNTER — Ambulatory Visit: Payer: Self-pay

## 2016-05-01 ENCOUNTER — Telehealth: Payer: Self-pay | Admitting: Internal Medicine

## 2016-05-01 NOTE — Telephone Encounter (Signed)
PT WAS NO SHOW, CALLED BOTH NUMBERS NO ANSWER NO VOICEMAIL

## 2016-05-04 ENCOUNTER — Ambulatory Visit: Payer: Self-pay

## 2016-05-28 ENCOUNTER — Telehealth: Payer: Self-pay | Admitting: Internal Medicine

## 2016-05-28 NOTE — Telephone Encounter (Signed)
APT. REMINDER CALL, NO ANSWER, NO VOICEMAIL °

## 2016-05-29 ENCOUNTER — Encounter: Payer: Self-pay | Admitting: Internal Medicine

## 2016-06-11 ENCOUNTER — Telehealth: Payer: Self-pay | Admitting: Internal Medicine

## 2016-06-11 NOTE — Telephone Encounter (Signed)
APT. REMINDER CALL, NO ANSWER, NO VOICEMAIL °

## 2016-06-12 ENCOUNTER — Ambulatory Visit: Payer: Self-pay

## 2016-06-15 ENCOUNTER — Telehealth: Payer: Self-pay | Admitting: Internal Medicine

## 2016-06-15 NOTE — Telephone Encounter (Signed)
APT. REMINDER CALL, NO ANSWER, NO VOICEMAIL °

## 2016-06-16 ENCOUNTER — Ambulatory Visit: Payer: Self-pay

## 2016-06-19 ENCOUNTER — Ambulatory Visit: Payer: Self-pay

## 2016-07-02 ENCOUNTER — Telehealth: Payer: Self-pay | Admitting: Internal Medicine

## 2016-07-02 NOTE — Telephone Encounter (Signed)
APT. REMINDER CALL, LMTCB °

## 2016-07-03 ENCOUNTER — Ambulatory Visit: Payer: Self-pay

## 2016-07-06 ENCOUNTER — Ambulatory Visit: Payer: Self-pay

## 2016-07-13 ENCOUNTER — Ambulatory Visit: Payer: Self-pay

## 2016-07-30 ENCOUNTER — Telehealth: Payer: Self-pay | Admitting: Internal Medicine

## 2016-07-30 NOTE — Telephone Encounter (Signed)
APT. REMINDER CALL, LMTCB °

## 2016-07-31 ENCOUNTER — Encounter: Payer: Self-pay | Admitting: Internal Medicine

## 2016-08-06 ENCOUNTER — Telehealth: Payer: Self-pay | Admitting: Internal Medicine

## 2016-08-06 NOTE — Telephone Encounter (Signed)
APT. REMINDER CALL, NO ANSWER, NO VOICEMAIL °

## 2016-08-07 ENCOUNTER — Ambulatory Visit (INDEPENDENT_AMBULATORY_CARE_PROVIDER_SITE_OTHER): Payer: Self-pay | Admitting: Internal Medicine

## 2016-08-07 ENCOUNTER — Encounter: Payer: Self-pay | Admitting: Internal Medicine

## 2016-08-07 VITALS — BP 140/75 | HR 84 | Temp 97.8°F | Wt 170.8 lb

## 2016-08-07 DIAGNOSIS — G8929 Other chronic pain: Secondary | ICD-10-CM

## 2016-08-07 DIAGNOSIS — F17210 Nicotine dependence, cigarettes, uncomplicated: Secondary | ICD-10-CM

## 2016-08-07 DIAGNOSIS — Z23 Encounter for immunization: Secondary | ICD-10-CM

## 2016-08-07 DIAGNOSIS — G629 Polyneuropathy, unspecified: Secondary | ICD-10-CM | POA: Insufficient documentation

## 2016-08-07 DIAGNOSIS — Z0000 Encounter for general adult medical examination without abnormal findings: Secondary | ICD-10-CM

## 2016-08-07 DIAGNOSIS — I10 Essential (primary) hypertension: Secondary | ICD-10-CM | POA: Insufficient documentation

## 2016-08-07 DIAGNOSIS — F1020 Alcohol dependence, uncomplicated: Secondary | ICD-10-CM

## 2016-08-07 DIAGNOSIS — F10220 Alcohol dependence with intoxication, uncomplicated: Secondary | ICD-10-CM

## 2016-08-07 DIAGNOSIS — M5416 Radiculopathy, lumbar region: Secondary | ICD-10-CM

## 2016-08-07 DIAGNOSIS — M1712 Unilateral primary osteoarthritis, left knee: Secondary | ICD-10-CM | POA: Insufficient documentation

## 2016-08-07 MED ORDER — HYDROCHLOROTHIAZIDE 12.5 MG PO CAPS: 12.5000 mg | ORAL_CAPSULE | Freq: Every day | ORAL | 2 refills | Status: DC

## 2016-08-07 MED ORDER — ACETAMINOPHEN 500 MG PO TABS: 500.0000 mg | ORAL_TABLET | Freq: Four times a day (QID) | ORAL | 0 refills | Status: DC | PRN

## 2016-08-07 MED ORDER — IBUPROFEN 400 MG PO TABS: 400.0000 mg | ORAL_TABLET | Freq: Four times a day (QID) | ORAL | 0 refills | Status: DC | PRN

## 2016-08-07 NOTE — Progress Notes (Signed)
   CC: Healthcare maintenance visit, knee and back pain  HPI:  [name] is a 55 y.o. male with PMHx detailed below presenting for healthcare maintenance visit after recently acquiring his orange card. He reports chronic left knee pain and low back pain that shoots down his legs.  See problem based assessment and plan below for additional details.  Past Medical History:  Diagnosis Date  . Alcohol related seizure (HCC)    h/o withdrawal sz/notes 04/17/2013  . ETOH abuse   . 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 Hypertension     Review of Systems: Review of Systems  Constitutional: Negative for chills, fever, malaise/fatigue and weight loss.  Respiratory: Negative for cough and shortness of breath.   Cardiovascular: Negative for chest pain and palpitations.  Gastrointestinal: Negative for abdominal pain, diarrhea, nausea and vomiting.  Musculoskeletal: Positive for back pain and joint pain.  All other systems reviewed and are negative.  Physical Exam: Vitals:   08/07/16 1541  BP: 140/75  Pulse: 84  Temp: 97.8 F (36.6 C)  TempSrc: Oral  SpO2: 98%  Weight: 170 lb 12.8 oz (77.5 kg)   Body mass index is 24.16 kg/m. 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 RESP- Clear to ascultation bilaterally, no wheezing or crackles, normal work of breathing ABDOMEN- Soft, nontender, nondistended BACK- Normal curvature, mild lumbar paraspinal tenderness NEURO- Alert and oriented, strength and sensation intact, coarse tremor of hands EXTREMITIES- Normal bulk and range of motion, no edema, 2+ peripheral pulses, pain elicited with passive and active left knee movement SKIN- Warm, dry, intact,  without visible rash PSYCH- Appropriate affect, clear speech, thoughts linear and goal-directed  Assessment & Plan:   See encounters tab for problem based medical decision making.  Patient discussed with Dr. Cyndie ChimeGranfortuna

## 2016-08-09 NOTE — Assessment & Plan Note (Signed)
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 Reports he has not tried any medications for this and that he "drinks alcohol for that." Left knee x-ray from 11/2014 reports mild degenerative changes. Pain with passive and active range of motion of left knee on exam, no palpable crepitus or deformity.   Plan: - Tylenol and/or Advil Q6h prn - Heat or ice packs

## 2016-08-09 NOTE — Assessment & Plan Note (Addendum)
Reports that he has cut down somewhat on his drinking - now 2x 40 oz beers 2-3 days each week. He has been a heavy regular drinker for about 35 years. He plans to enter an alcohol cessation assistance program next week ([facility]). States that is has been years since he had alcohol withdrawal seizures. States that he sometimes gets "the shakes" when he goes without drinking for a few days. Last drink was 30 minutes clinic visit. Coarse tremor of hands on exam.  Plan: - Encouraged continued gradual alcohol cessation

## 2016-08-09 NOTE — Assessment & Plan Note (Addendum)
Reports chronic low back pain that shoots down both legs, burning pain, tingling and numbness in his legs. No saddle anesthesia or bowel/bladder incontinence. Positive leg raise test on physical exam. Lumbar XR in 12/2012 showed mild multilevel lower lumbar degenerative disc disease and facet arthropathy.  Plan: - Trial Tylenol / Advil Q6h prn - Consider Gabapentin, Amitriptyline, or Duloxetine in future

## 2016-08-09 NOTE — Assessment & Plan Note (Signed)
Provided flu vaccine. Refused lab draw today but needs lipid panel, CBC, CMP, HCV at next visit.

## 2016-08-09 NOTE — Assessment & Plan Note (Signed)
BP 140/75, on manual recheck in high 140s/90s. Not on any medications.  Plan: - Advised to adhere to low sodium diet - Prescribed HCTZ 12.5 mg daily

## 2016-08-10 NOTE — Progress Notes (Signed)
Medicine attending: I reviewed the clinical history and physical findings of this patient on the day of the patient visit  with resident physician Dr. Althia FortsAdam Johnson and we discussed a management plan. Main issues are hypertension and alcohol abuse. Past hx of ETOH related seizures. Not currently on anticonvulsants. The patient left the office while the resident was presenting his case to me so I did not have the chance to examine him. A short interval follow up visit was scheduled.

## 2016-08-16 ENCOUNTER — Telehealth: Payer: Self-pay | Admitting: Internal Medicine

## 2016-08-16 ENCOUNTER — Encounter: Payer: Self-pay | Admitting: Internal Medicine

## 2016-08-16 DIAGNOSIS — M5416 Radiculopathy, lumbar region: Secondary | ICD-10-CM

## 2016-08-16 MED ORDER — GABAPENTIN 300 MG PO CAPS: 300.0000 mg | ORAL_CAPSULE | Freq: Three times a day (TID) | ORAL | 1 refills | Status: DC

## 2016-08-17 ENCOUNTER — Encounter: Payer: Self-pay | Admitting: Internal Medicine

## 2016-08-17 NOTE — Telephone Encounter (Signed)
   Reason for call:   I received a call from [name] at 955 PM 08/16/16 indicating he had increased lower back and leg pain and requesting new medication for treatment.   Pertinent Data:   This pain is chronic but has been worse for the past few days.  There is sometimes tingling in his feet but never persistent numbness and no weakness. He has no symptoms of saddle anesthesia or bowel or bladder control.  He is walking around less due to pain but not falling.  Advil and tylenol have not improved this pain much.  [name] was most recently seen by his PCP Todd Tran about 2 weeks ago and discussed his back and leg pains at that time. He was felt to have a component due to lumbar radiculopathy.   Assessment / Plan / Recommendations:   Prescribed gabapentin 300mg  for leg pain based on symptoms and review of recent clinic encounter. [facility] and patient plans to pick up medicine after Christmas.  As always, pt is advised that if symptoms worsen or new symptoms arise, they should go to an urgent care facility or to to ER for further evaluation.   Todd Planhristopher W Neko Mcgeehan, MD   08/17/2016, 6:46 AM

## 2016-10-20 ENCOUNTER — Other Ambulatory Visit: Payer: Self-pay | Admitting: Internal Medicine

## 2016-10-23 ENCOUNTER — Ambulatory Visit (INDEPENDENT_AMBULATORY_CARE_PROVIDER_SITE_OTHER): Payer: Self-pay | Admitting: Internal Medicine

## 2016-10-23 ENCOUNTER — Encounter: Payer: Self-pay | Admitting: Internal Medicine

## 2016-10-23 VITALS — BP 127/76 | HR 91 | Temp 98.3°F | Wt 167.5 lb

## 2016-10-23 DIAGNOSIS — M79605 Pain in left leg: Secondary | ICD-10-CM

## 2016-10-23 DIAGNOSIS — M5410 Radiculopathy, site unspecified: Secondary | ICD-10-CM

## 2016-10-23 DIAGNOSIS — Z598 Other problems related to housing and economic circumstances: Secondary | ICD-10-CM

## 2016-10-23 DIAGNOSIS — F1020 Alcohol dependence, uncomplicated: Secondary | ICD-10-CM

## 2016-10-23 DIAGNOSIS — M79604 Pain in right leg: Secondary | ICD-10-CM

## 2016-10-23 DIAGNOSIS — F10220 Alcohol dependence with intoxication, uncomplicated: Secondary | ICD-10-CM

## 2016-10-23 DIAGNOSIS — R296 Repeated falls: Secondary | ICD-10-CM

## 2016-10-23 DIAGNOSIS — I10 Essential (primary) hypertension: Secondary | ICD-10-CM

## 2016-10-23 DIAGNOSIS — Z9181 History of falling: Secondary | ICD-10-CM

## 2016-10-23 DIAGNOSIS — R202 Paresthesia of skin: Secondary | ICD-10-CM

## 2016-10-23 MED ORDER — ACETAMINOPHEN 500 MG PO TABS: 500.0000 mg | ORAL_TABLET | Freq: Four times a day (QID) | ORAL | 0 refills | Status: DC | PRN

## 2016-10-23 MED ORDER — GABAPENTIN 300 MG PO CAPS: 300.0000 mg | ORAL_CAPSULE | Freq: Every day | ORAL | 1 refills | Status: DC

## 2016-10-23 MED ORDER — IBUPROFEN 400 MG PO TABS: 400.0000 mg | ORAL_TABLET | Freq: Four times a day (QID) | ORAL | 0 refills | Status: DC | PRN

## 2016-10-23 NOTE — Patient Instructions (Addendum)
Thank you for visiting us today.  We have sent medications to [facility] ([id number][phone]). Please take your orange card with you to receive these medications at a reduced price:  - Gabapentin (Neurontin) 300 mg, take once daily before bedtime. If this does not control your pain and is not making you feel drowsy you can increase to twice daily. - Ibuprofen (Advil) 400 mg that you can take 2-3 times daily as needed for pain - Acetaminophen (Tylenol) 500 mg that you can take up to 4 times daily as needed for pain  We have checked some blood tests today. We will call if any of the results are abnormal.  If you have any questions or concerns, please call the clinic at [phone] or if it is the weekend or after hours, you may call [phone] and ask for the internal medicine resident on call.  It would be worthwhile for you to invest in a cane since you have such difficulty walking. You can find affordable canes at most pharmacies.

## 2016-10-23 NOTE — Assessment & Plan Note (Addendum)
Reports that he is cutting down his drinking, currently drinks one 40 ounce beer daily. Endorses drinking 30 minutes prior to attending clinic visit today but states this was to control his pain with ambulation. Reports that he is starting the [facility] program soon.  Plan: - Encouraged continued alcohol cessation and utilizing community addiction resources - Checking CBC, CMP, folate, vitamin B12

## 2016-10-23 NOTE — Assessment & Plan Note (Signed)
Was prescribed HCTZ 12.5 mg at previous visit, however was unable to acquire this medication due to cost. Has been normotensive without medication in the past. Normotensive today at 127/76 without medication.  Plan: - Discontinued previously prescribed HCTZ

## 2016-10-23 NOTE — Progress Notes (Signed)
   CC: Leg pain follow-up  HPI:  [name] is a 56 y.o. male with PMHx detailed below presenting for follow-up of his bilateral burning/tingling in his legs. He reports persistent pain, difficulties walking, instability, and has been unable to obtain any of the previously prescribed medications due to cost. He has since been able to secure the orange card has not tried using this at the pharmacy.   See problem based assessment and plan below for additional details.  Past Medical History:  Diagnosis Date  . Alcohol related seizure (HCC)    h/o withdrawal sz/notes 04/17/2013  . ETOH abuse   . 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 Hypertension     Review of Systems: Review of Systems  Constitutional: Negative for chills, fever, malaise/fatigue and weight loss.  Eyes: Positive for blurred vision and redness.  Respiratory: Negative for cough and shortness of breath.   Cardiovascular: Negative for chest pain, palpitations and leg swelling.  Gastrointestinal: Negative for abdominal pain, constipation, diarrhea, nausea and vomiting.  Genitourinary: Negative for dysuria.  Musculoskeletal: Positive for back pain, falls and joint pain. Negative for neck pain.  Neurological: Positive for tingling and sensory change (Numbness in feet). Negative for dizziness and weakness.  Psychiatric/Behavioral: Positive for substance abuse (Alcohol).  All other systems reviewed and are negative.    Physical Exam: Vitals:   10/23/16 1552  BP: 127/76  Pulse: 91  Temp: 98.3 F (36.8 C)  TempSrc: Oral  SpO2: 99%  Weight: 167 lb 8 oz (76 kg)   Body mass index is 23.69 kg/m.   GENERAL- Casually dressed thin man sitting comfortably in exam room chair, alert, in no distress, polite and conversational, appears older than stated age HEENT- Atraumatic, right eye with fixed exotropia and proptosis, conjunctival erythema bilaterally,  moist mucous membranes, poor dentition dentition CARDIAC- Regular rate and rhythm, no murmurs, rubs or gallops. RESP- Clear to ascultation bilaterally, no wheezing or crackles, normal work of breathing ABDOMEN- Normoactive bowel sounds, soft, nontender, nondistended BACK- Normal curvature, no paraspinal tenderness NEURO- Alert and oriented, strength and sensation grossly intact EXTREMITIES- Normal bulk and range of motion, no edema, 2+ peripheral pulses, left knee tender to palpation SKIN- Warm, dry, intact, without visible rash PSYCH- Appropriate affect, clear speech, thoughts linear and goal-directed  Assessment & Plan:   See encounters tab for problem based medical decision making.  Patient discussed with Dr. Cyndie ChimeGranfortuna

## 2016-10-23 NOTE — Assessment & Plan Note (Signed)
Reports that his bilateral burning leg pain and instability with walking has worsened over the last few weeks. He denies any saddle anesthesia or bowel/bladder retention or incontinence. He reports some numbness and tingling in his toes. He was not able to acquire progressive prescribed Tylenol, Advil, but Gabapentin due to financial barriers. He reports continued alcohol use to treat this pain. Since previous visit he has acquired the orange card, but has not tried using this at the pharmacy. He has difficulty ambulating and feels unstable his feet, he reports several falls but no injuries, most recently 2 weeks ago.   Plan: - Advised to reattempt acquiring medications from pharmacy with orange card and hand - Represcribed prescription strength Tylenol, Advil and advised to utilize these medications up to Q6H PRN - Represcribed Gabapentin 300 mg daily at bedtime initially, and advised on up to 3 times a day dosing for neuropathic pain control - Checking CBC, vitamin B12, folate given chronic alcoholism and possible nutritional deficiency - Advised to acquire a cane for support with ambulation

## 2016-10-24 LAB — CBC
HEMOGLOBIN: 14 g/dL (ref 13.0–17.7)
Hematocrit: 41.6 % (ref 37.5–51.0)
MCH: 31.3 pg (ref 26.6–33.0)
MCHC: 33.7 g/dL (ref 31.5–35.7)
MCV: 93 fL (ref 79–97)
PLATELETS: 170 10*3/uL (ref 150–379)
RBC: 4.48 x10E6/uL (ref 4.14–5.80)
RDW: 13.8 % (ref 12.3–15.4)
WBC: 4.9 10*3/uL (ref 3.4–10.8)

## 2016-10-24 LAB — CMP14 + ANION GAP
ALT: 38 IU/L (ref 0–44)
ANION GAP: 16 mmol/L (ref 10.0–18.0)
AST: 102 IU/L — AB (ref 0–40)
Albumin/Globulin Ratio: 1 — ABNORMAL LOW (ref 1.2–2.2)
Albumin: 4.1 g/dL (ref 3.5–5.5)
Alkaline Phosphatase: 98 IU/L (ref 39–117)
BUN/Creatinine Ratio: 6 — ABNORMAL LOW (ref 9–20)
BUN: 5 mg/dL — ABNORMAL LOW (ref 6–24)
Bilirubin Total: 0.5 mg/dL (ref 0.0–1.2)
CALCIUM: 9.3 mg/dL (ref 8.7–10.2)
CO2: 26 mmol/L (ref 18–29)
CREATININE: 0.83 mg/dL (ref 0.76–1.27)
Chloride: 97 mmol/L (ref 96–106)
GFR calc Af Amer: 115 mL/min/{1.73_m2} (ref >59)
GFR, EST NON AFRICAN AMERICAN: 100 mL/min/{1.73_m2} (ref >59)
Globulin, Total: 4.1 g/dL (ref 1.5–4.5)
Glucose: 99 mg/dL (ref 65–99)
Potassium: 4.3 mmol/L (ref 3.5–5.2)
Sodium: 139 mmol/L (ref 134–144)
TOTAL PROTEIN: 8.2 g/dL (ref 6.0–8.5)

## 2016-10-24 LAB — VITAMIN B12: Vitamin B-12: 368 pg/mL (ref 232–1245)

## 2016-10-26 LAB — FOLATE RBC
FOLATE, HEMOLYSATE: 252.9 ng/mL
Folate, RBC: 608 ng/mL (ref >498)

## 2016-10-26 NOTE — Progress Notes (Signed)
Medicine attending: Medical history, presenting problems, physical findings, and medications, reviewed with resident physician Dr Adam Johnson on the day of the patient visit and I concur with his evaluation and management plan. 

## 2016-12-11 ENCOUNTER — Encounter: Payer: Self-pay | Admitting: Internal Medicine

## 2016-12-18 ENCOUNTER — Telehealth: Payer: Self-pay | Admitting: Internal Medicine

## 2016-12-18 NOTE — Telephone Encounter (Signed)
APT. REMINDER CALL, NO ANSWER AND NO VOICEMAIL °

## 2016-12-21 ENCOUNTER — Ambulatory Visit: Payer: Self-pay

## 2016-12-23 ENCOUNTER — Telehealth: Payer: Self-pay | Admitting: Internal Medicine

## 2016-12-23 NOTE — Telephone Encounter (Signed)
APT. REMINDER CALL, LMTCB °

## 2016-12-24 ENCOUNTER — Ambulatory Visit: Payer: Self-pay

## 2017-01-11 ENCOUNTER — Telehealth: Payer: Self-pay | Admitting: Internal Medicine

## 2017-01-11 NOTE — Telephone Encounter (Signed)
APT. REMINDER CALL, LMTCB °

## 2017-01-12 ENCOUNTER — Ambulatory Visit: Payer: Self-pay

## 2017-01-14 ENCOUNTER — Ambulatory Visit: Payer: Self-pay

## 2017-01-14 ENCOUNTER — Encounter: Payer: Self-pay | Admitting: Internal Medicine

## 2017-02-01 ENCOUNTER — Encounter: Payer: Self-pay | Admitting: *Deleted

## 2017-02-10 ENCOUNTER — Ambulatory Visit: Payer: Self-pay

## 2017-02-10 ENCOUNTER — Ambulatory Visit (INDEPENDENT_AMBULATORY_CARE_PROVIDER_SITE_OTHER): Payer: Self-pay | Admitting: Internal Medicine

## 2017-02-10 VITALS — BP 120/65 | HR 75 | Temp 98.2°F | Wt 159.9 lb

## 2017-02-10 DIAGNOSIS — Z79899 Other long term (current) drug therapy: Secondary | ICD-10-CM

## 2017-02-10 DIAGNOSIS — R569 Unspecified convulsions: Secondary | ICD-10-CM

## 2017-02-10 DIAGNOSIS — M79605 Pain in left leg: Secondary | ICD-10-CM

## 2017-02-10 DIAGNOSIS — F1020 Alcohol dependence, uncomplicated: Secondary | ICD-10-CM

## 2017-02-10 DIAGNOSIS — IMO0002 Reserved for concepts with insufficient information to code with codable children: Secondary | ICD-10-CM

## 2017-02-10 DIAGNOSIS — M1712 Unilateral primary osteoarthritis, left knee: Secondary | ICD-10-CM

## 2017-02-10 DIAGNOSIS — G40909 Epilepsy, unspecified, not intractable, without status epilepticus: Secondary | ICD-10-CM

## 2017-02-10 DIAGNOSIS — M5410 Radiculopathy, site unspecified: Secondary | ICD-10-CM

## 2017-02-10 DIAGNOSIS — M79604 Pain in right leg: Secondary | ICD-10-CM

## 2017-02-10 LAB — GLUCOSE, CAPILLARY: GLUCOSE-CAPILLARY: 95 mg/dL (ref 65–99)

## 2017-02-10 LAB — POCT GLYCOSYLATED HEMOGLOBIN (HGB A1C): Hemoglobin A1C: 5.3

## 2017-02-10 MED ORDER — GABAPENTIN 300 MG PO CAPS: 300.0000 mg | ORAL_CAPSULE | Freq: Every day | ORAL | 1 refills | Status: DC

## 2017-02-10 MED ORDER — PHENYTOIN SODIUM EXTENDED 300 MG PO CAPS: 300.0000 mg | ORAL_CAPSULE | Freq: Every day | ORAL | 1 refills | Status: DC

## 2017-02-10 NOTE — Progress Notes (Signed)
   CC: Here for leg pain and seizures  HPI:  Todd Tran is a 56 y.o. man who is here complaining of increased bilateral lower leg pains for the past 1-2 weeks, and also for refill of his seizure medication after another seizure about 2 months ago.   See problem based assessment and plan below for additional details  Past Medical History:  Diagnosis Date  . Alcohol related seizure (HCC)    h/o withdrawal sz/notes 04/17/2013  . ETOH abuse   . 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 Hypertension     Review of Systems:  Review of Systems  Constitutional: Negative for weight loss.  Cardiovascular: Negative for leg swelling.  Gastrointestinal: Negative for nausea and vomiting.  Musculoskeletal: Positive for falls and joint pain. Negative for back pain.  Neurological: Positive for tremors. Negative for sensory change.  Psychiatric/Behavioral: Positive for substance abuse.    Physical Exam: Physical Exam  Constitutional:  Chronically ill appearing man in no acute distress  HENT:  Thrush present on palette and posterior oropharynx  Eyes:  Conjunctivae injected bilaterally, left eye laterally deviated, arcus senilis present  Cardiovascular: Normal rate and regular rhythm.   Pulmonary/Chest: Effort normal and breath sounds normal.  Musculoskeletal:  5/5 strength in all extremities, no pedal edema, left knee has prominence of lateral edge of patella with patellar crepitus on ROM, joint laxity of the medial collateral ligament allowing deformity with valgus movement  Lymphadenopathy:    He has no cervical adenopathy.  Neurological: He has normal reflexes.  Skin: Skin is warm and dry. No rash noted.  Feet are hairless but normal skin integrity, callous present at inferomedial aspect of left 1st MTP    Vitals:   02/10/17 0935  BP: 120/65  Pulse: 75  Temp: 98.2 F (36.8 C)  TempSrc: Oral  SpO2: 100%  Weight:  159 lb 14.4 oz (72.5 kg)    Assessment & Plan:   See Encounters Tab for problem based charting.  Patient discussed with Dr. Oswaldo DoneVincent

## 2017-02-10 NOTE — Patient Instructions (Signed)
It was a pleasure to see you today Todd Tran.  I recommend you to start taking the phenytoin 300mg  at night as soon as possible. It is very important you take this medicine if you are going to reduce your alcohol use.  I also sent prescription for the gabapentin which might be helpful for your leg pain.  For your left knee, a knee brace would probably help reduce your pain and instability.  Please call us if you start having any difficulties getting your medicine or symptoms are worse.

## 2017-02-11 ENCOUNTER — Telehealth: Payer: Self-pay

## 2017-02-11 LAB — HIV ANTIBODY (ROUTINE TESTING W REFLEX): HIV SCREEN 4TH GENERATION: NONREACTIVE

## 2017-02-11 LAB — VITAMIN B12: VITAMIN B 12: 457 pg/mL (ref 232–1245)

## 2017-02-11 LAB — RPR: RPR: NONREACTIVE

## 2017-02-11 NOTE — Telephone Encounter (Signed)
Initial call to inform patient of normal lab results from yesterday was unsuccessful in reaching him. We can try again later.  Negative for any chronic infection, vitamin deficiency, or diabetes as a cause of his leg pain. It is still most likely nerve problems from continued heavy alcohol use or possibly lower back disease.

## 2017-02-11 NOTE — Telephone Encounter (Signed)
Requesting lab result. Please call back. 

## 2017-02-11 NOTE — Progress Notes (Signed)
Internal Medicine Clinic Attending  Case discussed with Dr. Rice at the time of the visit.  We reviewed the resident's history and exam and pertinent patient test results.  I agree with the assessment, diagnosis, and plan of care documented in the resident's note.  

## 2017-02-11 NOTE — Assessment & Plan Note (Signed)
Last seizure reported by the patient in April of this year. He says this was because he tried to reduce his alcohol use at the time. He was not taking dilantin as prescribed due to cost.  Dr. Selena BattenKim assisted him with understanding the paper application and I will [facility]

## 2017-02-11 NOTE — Assessment & Plan Note (Signed)
HPI: He has longstanding pain in the left knee with some instability and infrequent falls. He used to wears a knee brace for this but has not done so for over a year.  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

## 2017-02-11 NOTE — Assessment & Plan Note (Addendum)
HPI: He continues to drink daily, estimating about "a 40 of malt liquor, and a beer in the morning to wake up." He thinks he has made some progress decreasing his alcohol consumption at home which led to a seizure 2 months ago. Despite this he overall feels good about making some progress. He acknowledges that his alcohol use is harmful to his health and making his other physical complaints worse.  A: Long term alcohol use disorder He may be suffering peripheral neuropathy secondary to this. He fair insight regarding his alcohol use being excessive and harmful. He does not want to be admitted for a inpatient detoxification today because he wants to try on his own first. He has a well established history of alcohol withdrawal seizures so any acute cessation would need to be supervised with medical assistance.  P: Alcohol use disorder, ongoing He is contemplative about use reduction, but with a questionable commitment to achieving full cessation at this time  I stated very clearly that we would welcome a request for help quitting and could admit him for detox if he ever decides to do this.

## 2017-02-11 NOTE — Assessment & Plan Note (Signed)
HPI: He has been suffering bilateral distal leg pains for several months. This pain bothers him when standing or walking for prolonged times. It also often hurts when lying in bed at night. He has not noticed any large changes in his sensation or strength. He has fallen due to the left knee "giving out" but this knee has been problematic much longer than the current bilateral leg pain complaint. He was previously prescribed gabapentin for peripheral nerve pain but is off all medications due to cost.  A: Bilateral distal leg pain I was initially concerned for possible vascular claudication given the pain distribution and provocation with prolonged use but ABIs were checked in the clinic revealing triphasic waveforms with 1.20-1.21 ABIs bilaterally. The pain is not reported as radiating from his back and there is no back pain or tenderness to movement or direct palpation today so I think lumbar radiculopathy is less likely the cause. He is actively drinking heavily and follows up with healthcare infrequently so we will need to exclude nutritional deficiency or chronic infection causing peripheral neuropathy today as well.  P: Reorder gabapentin 300mg  through [facility] pharmacy Check RPR, HIV, B12, A1c today - all labs negative/WNL

## 2017-02-17 ENCOUNTER — Ambulatory Visit: Payer: Self-pay

## 2017-02-25 ENCOUNTER — Other Ambulatory Visit: Payer: Self-pay | Admitting: Internal Medicine

## 2017-03-10 ENCOUNTER — Encounter: Payer: Self-pay | Admitting: Internal Medicine

## 2017-03-10 ENCOUNTER — Ambulatory Visit (INDEPENDENT_AMBULATORY_CARE_PROVIDER_SITE_OTHER): Payer: Self-pay | Admitting: Internal Medicine

## 2017-03-10 VITALS — BP 138/87 | HR 100 | Temp 98.2°F | Wt 161.4 lb

## 2017-03-10 DIAGNOSIS — Z79899 Other long term (current) drug therapy: Secondary | ICD-10-CM

## 2017-03-10 DIAGNOSIS — I10 Essential (primary) hypertension: Secondary | ICD-10-CM

## 2017-03-10 DIAGNOSIS — F17210 Nicotine dependence, cigarettes, uncomplicated: Secondary | ICD-10-CM

## 2017-03-10 DIAGNOSIS — F1090 Alcohol use, unspecified, uncomplicated: Secondary | ICD-10-CM

## 2017-03-10 DIAGNOSIS — R569 Unspecified convulsions: Secondary | ICD-10-CM

## 2017-03-10 DIAGNOSIS — F10239 Alcohol dependence with withdrawal, unspecified: Secondary | ICD-10-CM

## 2017-03-10 DIAGNOSIS — M1712 Unilateral primary osteoarthritis, left knee: Secondary | ICD-10-CM

## 2017-03-10 DIAGNOSIS — M5410 Radiculopathy, site unspecified: Secondary | ICD-10-CM

## 2017-03-10 DIAGNOSIS — IMO0002 Reserved for concepts with insufficient information to code with codable children: Secondary | ICD-10-CM

## 2017-03-10 DIAGNOSIS — M79605 Pain in left leg: Secondary | ICD-10-CM

## 2017-03-10 DIAGNOSIS — M79604 Pain in right leg: Secondary | ICD-10-CM

## 2017-03-10 MED ORDER — GABAPENTIN 300 MG PO CAPS: 300.0000 mg | ORAL_CAPSULE | Freq: Every day | ORAL | 1 refills | Status: DC

## 2017-03-10 MED ORDER — IBUPROFEN 400 MG PO TABS: 400.0000 mg | ORAL_TABLET | Freq: Four times a day (QID) | ORAL | 3 refills | Status: DC | PRN

## 2017-03-10 MED ORDER — PHENYTOIN SODIUM EXTENDED 300 MG PO CAPS: 300.0000 mg | ORAL_CAPSULE | Freq: Every day | ORAL | 1 refills | Status: DC

## 2017-03-10 MED ORDER — ACETAMINOPHEN 500 MG PO TABS: 500.0000 mg | ORAL_TABLET | Freq: Four times a day (QID) | ORAL | 3 refills | Status: DC | PRN

## 2017-03-10 NOTE — Assessment & Plan Note (Addendum)
The patient was previously prescribed Tylenol and ibuprofen for PRN use but [facility] 2[facility]2 financial limitations. [facility] was contacted and it was confirmed that the patient will receive benefits here through 01/2018. He was prescribed Tylenol 500 mg and ibuprofen 400 mg to use as needed for pain management. Once he obtains his orange card consider referral to sports medicine for evaluation and possible knee brace recommendation.  Plan: -Start taking Tylenol 500 mg q6 hours PRN and ibuprofen 400 mg q6 hours PRN -Consider PT/Sports medicine evaluation once orange card is obtained -Follow up with PCP in 2-3 months

## 2017-03-10 NOTE — Patient Instructions (Addendum)
Thank you for seeing us today!  You should start taking 1 tablet of phenytoin (300mg ) at night as soon as you receive it in the mail. It is very important you take this medicine if you are going to reduce your alcohol use.  You should start taking 1 tablet of gabapentin (300mg ) at night as soon as you receive it in the mail.    You can also take Tylenol and/or ibuprofen for leg, back, and knee pain. You should also receive these medications in the mail. You can take 1 of these tablets every 6 hours as needed for pain. You do not have to take these every day, only when your pain limits you from walking around.  Please call us if you start having any difficulties getting your medicine or symptoms are worse. Otherwise, please follow up with PCP Dr. Evelene CroonSantos in 2-3 months.

## 2017-03-10 NOTE — Assessment & Plan Note (Signed)
Patient cut back to 2, 40 oz malt beverages per day. Per chart review the patient has a history of alcohol withdrawal seizures and was to start phenytoin after last visit. The patient expressed interest in cutting back even more and was told this could be dangerous given his history of seizures. He was given information about local programs for uninsured patients who can safely help patients abstain from alcohol if they want. He was told to seek help if he decides to quit before his next visit with his PCP.

## 2017-03-10 NOTE — Assessment & Plan Note (Addendum)
The patient was previously prescribed gabapentin 300 mg to take each night but [facility] 2[facility]2 financial limitations. This is likely the best regimen for this pain, given that his description of radiating lower back pain seems to be neurologic in origin. He was re-prescribed gabapentin 300 mg at this visit and told to take it once he receives it in the mail via [facility].

## 2017-03-10 NOTE — Progress Notes (Signed)
   CC: Joint pain and withdrawal seizure follow up  HPI:  [name] is a 56 y.o. with a PMH of ETOH abuse with withdrawal seizures, HTN, and OA of left knee who is presenting today for follow up of his chronic medical conditions. Since his last visit, the patient has felt well and denies any acute complaints. He states that he was, however, unable to get approved for the orange card since his last visit because he is still waiting on two papers. He states that he was unable to get any of the previously prescribed medications filled because he doesn't have insurance.   He has tried cutting back on his alcohol since his last visit and is down to drinking 2 40-oz malt beverages per day. He denies any recent seizures. He states that he still has low back pain which radiates to both his legs and makes his legs feel like they are "on fire". The patient also complains of left knee pain and says that he has no cartilage in his joint. This causes him pain when he walks around. He states that his knee "locks up" on him when he sits for a long time and about 3 days ago he fell because his knee locked up. He denied hitting his head and LOC.  The patient expresses the desire to stop drinking entirely, but does not want to do so at this time. He states that he has two pending court cases that he has to take care of in the middle of August. After these court cases are settled, he would like to quit drinking entirely.     Past Medical History:  Diagnosis Date  . Alcohol related seizure (HCC)    h/o withdrawal sz/notes 04/17/2013  . ETOH abuse   . 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 Hypertension    Review of Systems:   Patient endorses bilateral radiating lower leg pain and back pain Patient denies chest pain, SOB, abdominal pain, nausea, vomiting and change in bowel/bladder habits.  Physical Exam:  Vitals:   03/10/17 1436  BP:  138/87  Pulse: 100  Temp: 98.2 F (36.8 C)  TempSrc: Oral  SpO2: 100%  Weight: 161 lb 6.4 oz (73.2 kg)   Physical Exam  Constitutional: He appears well-developed and well-nourished. No distress.  Eyes:  Left eye deviation when looking forward. Conjunctiva injected bilaterally without discharge.   Cardiovascular: Normal rate, regular rhythm, normal heart sounds and intact distal pulses.  Exam reveals no friction rub.   No murmur heard. Pulmonary/Chest: Effort normal and breath sounds normal. No respiratory distress. He has no wheezes.  Abdominal: Soft. He exhibits no distension.  Musculoskeletal: He exhibits tenderness (palpation of medial and lateral aspects of L Knee). He exhibits no edema (in bilateral lower extremities.).  Pain with extension and flexion of L knee, but full ROM. No crepitus, deformity, or effusion appreciated. Full ROM on R knee without pain.   Skin: Skin is warm and dry. Capillary refill takes less than 2 seconds. No erythema. No pallor.    Assessment & Plan:   See Encounters Tab for problem based charting.  Patient seen with Dr. Rogelia BogaButcher

## 2017-03-10 NOTE — Assessment & Plan Note (Addendum)
The patient was previously prescribed phenytoin 300 mg ER to take each night but did not get this medication through [facility] 2/2 financial limitations. He was re-prescribed phenytoin 300 mg ER at this visit and told to take it once he receives it in the mail via [facility].

## 2017-03-11 NOTE — Progress Notes (Signed)
Internal Medicine Clinic Attending  I saw and evaluated the patient.  I personally confirmed the key portions of the history and exam documented by Dr. Nedrud and I reviewed pertinent patient test results.  The assessment, diagnosis, and plan were formulated together and I agree with the documentation in the resident's note.  

## 2017-04-30 ENCOUNTER — Encounter: Payer: Self-pay | Admitting: Internal Medicine

## 2017-06-04 ENCOUNTER — Encounter: Payer: Self-pay | Admitting: Internal Medicine

## 2017-06-13 ENCOUNTER — Emergency Department (HOSPITAL_COMMUNITY)
Admission: EM | Admit: 2017-06-13 | Discharge: 2017-06-14 | Disposition: A | Discharge status: Home / Self Care | Payer: Self-pay | Attending: Emergency Medicine | Admitting: Emergency Medicine

## 2017-06-13 DIAGNOSIS — Z79899 Other long term (current) drug therapy: Secondary | ICD-10-CM | POA: Insufficient documentation

## 2017-06-13 DIAGNOSIS — I10 Essential (primary) hypertension: Secondary | ICD-10-CM | POA: Insufficient documentation

## 2017-06-13 DIAGNOSIS — M5442 Lumbago with sciatica, left side: Secondary | ICD-10-CM | POA: Insufficient documentation

## 2017-06-13 DIAGNOSIS — R000 Tachycardia, unspecified: Secondary | ICD-10-CM | POA: Insufficient documentation

## 2017-06-13 DIAGNOSIS — F1010 Alcohol abuse, uncomplicated: Secondary | ICD-10-CM | POA: Insufficient documentation

## 2017-06-13 DIAGNOSIS — F17210 Nicotine dependence, cigarettes, uncomplicated: Secondary | ICD-10-CM | POA: Insufficient documentation

## 2017-06-13 MED ORDER — METHOCARBAMOL 500 MG PO TABS
1000.0000 mg | ORAL_TABLET | Freq: Once | ORAL | Status: AC
  Administered 2017-06-13: 1000 mg via ORAL
  Filled 2017-06-13: qty 2

## 2017-06-13 MED ORDER — OXYCODONE HCL 5 MG PO TABS
10.0000 mg | ORAL_TABLET | Freq: Once | ORAL | Status: AC
  Administered 2017-06-13: 10 mg via ORAL
  Filled 2017-06-13: qty 2

## 2017-06-13 NOTE — ED Triage Notes (Signed)
Pt arrived from home via [facility] with a c/c of lower back and leg pain. Pt has chronic back pain but the leg pain is new and he states has gotten worse in the past few days. Pt denies any injury. Pt is alert and oriented x4.

## 2017-06-13 NOTE — ED Provider Notes (Signed)
[facility] EMERGENCY DEPARTMENT Provider Note   CSN: [id number] Arrival date & time: 06/13/17  2157     History   Chief Complaint Chief Complaint  Patient presents with  . Back Pain  . Leg Pain    HPI Todd Tran is a 56 y.o. male with a hx of withdrawal seizures, hypertension, osteoarthritis presents to the Emergency Department complaining of gradual, persistent, progressively worsening eft-sided back pain onset approximately 4 days ago. Patient reports the pain radiates down his left leg, lateral and anterior portion down to the knee but does not pass the knee. He reports he's had decreased ability to walk due to this pain. He states he's been taking ibuprofen at home without relief. He also reports taking 2 doses of gabapentin since the pain began. He reports associated hematuria but denies loss of bowel or bladder control, numbness, recent traumas, IV drug use, fevers.  ecord review shows that patient had similar complaints at his last primary care visit in June 2018. At that time he was prescribed Tylenol, ibuprofen and gabapentin. At that time patient was drinking approximately two 40 ounce beers per day. I'm questioning, he avoids this question somewhat but does state he is still drinking. He does report taking his seizure medication daily for the last 2 weeks since it arrived.  The history is provided by the patient and medical records. No language interpreter was used.    Past Medical History:  Diagnosis Date  . Alcohol related seizure (HCC)    h/o withdrawal sz/notes 04/17/2013  . ETOH abuse   . 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 Hypertension     Patient Active Problem List   Diagnosis Date Noted  . Osteoarthritis of left knee 08/07/2016  . Bilateral leg pain 08/07/2016  . Healthcare maintenance 04/24/2016  . Seizures (HCC) 04/24/2016  . Alcohol use disorder 12/01/2012     Past Surgical History:  Procedure Laterality Date  . NO PAST SURGERIES         Home Medications    Prior to Admission medications   Medication Sig Start Date End Date Taking? Authorizing Provider  ibuprofen (ADVIL,MOTRIN) 400 MG tablet Take 1 tablet (400 mg total) by mouth every 6 (six) hours as needed. 03/10/17  Yes NedrudJeanella Flattery, MD  phenytoin (DILANTIN) 300 MG ER capsule Take 1 capsule (300 mg total) by mouth at bedtime. 03/10/17  Yes Nedrud, Jeanella Flattery, MD  acetaminophen (TYLENOL) 500 MG tablet Take 1 tablet (500 mg total) by mouth every 6 (six) hours as needed. Patient not taking: Reported on 06/14/2017 03/10/17   Rozann Lesches, MD  gabapentin (NEURONTIN) 300 MG capsule Take 1 capsule (300 mg total) by mouth at bedtime. Patient not taking: Reported on 06/14/2017 03/10/17   Rozann Lesches, MD  methocarbamol (ROBAXIN) 500 MG tablet Take 1 tablet (500 mg total) by mouth 2 (two) times daily. 06/14/17   Grant Swager, Dahlia Client, PA-C    Family History Family History  Problem Relation Age of Onset  . Diabetes Mother   . Diabetes Sister     Social History Social History  Substance Use Topics  . Smoking status: Current Every Day Smoker    Packs/day: 0.25    Years: 30.00    Types: Cigarettes    Last attempt to quit: 01/22/2013  . Smokeless tobacco: Never Used  . Alcohol use Yes     Comment: currently 3x40oz beers per  day, used to be "a lot more"     Allergies   Patient has no known allergies.   Review of Systems Review of Systems  Constitutional: Negative for appetite change, diaphoresis, fatigue, fever and unexpected weight change.  HENT: Negative for mouth sores.   Eyes: Negative for visual disturbance.  Respiratory: Negative for cough, chest tightness, shortness of breath and wheezing.   Cardiovascular: Negative for chest pain.  Gastrointestinal: Negative for abdominal pain, constipation, diarrhea, nausea and vomiting.  Endocrine: Negative for polydipsia,  polyphagia and polyuria.  Genitourinary: Positive for hematuria. Negative for dysuria, frequency and urgency.  Musculoskeletal: Positive for arthralgias ( left leg pain) and back pain. Negative for neck stiffness.  Skin: Negative for rash.  Allergic/Immunologic: Negative for immunocompromised state.  Neurological: Negative for syncope, light-headedness and headaches.  Hematological: Does not bruise/bleed easily.  Psychiatric/Behavioral: Negative for sleep disturbance. The patient is not nervous/anxious.   All other systems reviewed and are negative.    Physical Exam Updated Vital Signs BP (!) 163/104 (BP Location: Right Arm)   Pulse 98   Temp 98.7 F (37.1 C) (Oral)   Resp 16   Ht 5\' 11"  (1.803 m)   Wt 77.1 kg (170 lb)   SpO2 99%   BMI 23.71 kg/m   Physical Exam  Constitutional: He appears well-developed. No distress.  Awake, alert, nontoxic appearance  HENT:  Head: Normocephalic and atraumatic.  Mouth/Throat: Oropharynx is clear and moist. No oropharyngeal exudate.  Eyes: Right conjunctiva is injected. Left conjunctiva is injected. No scleral icterus.  Neck: Normal range of motion. Neck supple.  Full ROM without pain  Cardiovascular: Regular rhythm and intact distal pulses.  Tachycardia present.   Pulses:      Radial pulses are 2+ on the right side, and 2+ on the left side.       Dorsalis pedis pulses are 2+ on the right side, and 2+ on the left side.       Posterior tibial pulses are 2+ on the right side, and 2+ on the left side.  Pulmonary/Chest: Effort normal and breath sounds normal. No respiratory distress. He has no wheezes.  Equal chest expansion  Abdominal: Soft. Bowel sounds are normal. He exhibits no distension and no mass. There is no tenderness. There is no rigidity, no rebound, no guarding and no CVA tenderness.  Musculoskeletal: Normal range of motion. He exhibits no edema.  Full range of motion of the T-spine and L-spine No midline tenderness to the   T-spine or L-spine Tenderness to palpation of the Left paraspinous muscles of the L-spine and over the left SI joint  Lymphadenopathy:    He has no cervical adenopathy.  Neurological: He is alert.  Speech is clear and goal oriented, follows commands Normal 5/5 strength in upper and lower extremities bilaterally including dorsiflexion and plantar flexion, strong and equal grip strength Sensation normal to light and sharp touch Moves extremities without ataxia, coordination intact Antalgic gait with significant limp on the left leg No Clonus  Skin: Skin is warm and dry. No rash noted. He is not diaphoretic. No erythema.  Psychiatric: He has a normal mood and affect. His behavior is normal.  Nursing note and vitals reviewed.    ED Treatments / Results  Labs (all labs ordered are listed, but only abnormal results are displayed) Labs Reviewed  URINALYSIS, ROUTINE W REFLEX MICROSCOPIC     Procedures Procedures (including critical care time)  Medications Ordered in ED Medications  methocarbamol (ROBAXIN) tablet 1,000 mg (  1,000 mg Oral Given 06/13/17 2243)  oxyCODONE (Oxy IR/ROXICODONE) immediate release tablet 10 mg (10 mg Oral Given 06/13/17 2243)  sodium chloride 0.9 % bolus 1,000 mL (0 mLs Intravenous Stopped 06/14/17 0331)     Initial Impression / Assessment and Plan / ED Course  I have reviewed the triage vital signs and the nursing notes.  Pertinent labs & imaging results that were available during my care of the patient were reviewed by me and considered in my medical decision making (see chart for details).  Clinical Course as of Jun 14 605  Mon Jun 14, 2017  0129 Pt reports his pain is significantly better.  HR now in the upper 90's.    [HM]    Clinical Course User Index [HM] Kysean Sweet, Dahlia ClientHannah, [place] JerseyPA-C    Patient with back pain.  No neurological deficits and normal neuro exam.  Pain control given.  Patient with improved gait but states is painful.  No loss of bowel  or bladder control.  No concern for cauda equina.  No fever, night sweats, weight loss, h/o cancer, IVDU.  RICE protocol, muscle relaxer and gabapentin (already prescribed) indicated and discussed with patient.    Pt with hx of EtOH abuse.  He is tachycardic on arrival, but this resolves with fluids and pain control.  No CP or SOB.  Pt does c/o intermittent hematuria.  Pt is not colicky to suggest renal colic, and UA is without hematuria today.    Patient noted to be hypertensive in the emergency department.  No clinical signs of hypertensive urgency.  Discussed with patient the need for close follow-up and management by their primary care physician.    Final Clinical Impressions(s) / ED Diagnoses   Final diagnoses:  Acute left-sided low back pain with left-sided sciatica  Essential hypertension  Tachycardia  ETOH abuse    New Prescriptions Discharge Medication List as of 06/14/2017  3:22 AM    START taking these medications   Details  methocarbamol (ROBAXIN) 500 MG tablet Take 1 tablet (500 mg total) by mouth 2 (two) times daily., Starting Mon 06/14/2017, Print         Regnald Bowens, [place] Valley[place], PA-C 06/14/17 [id number]    Maia PlanLong, Joshua G, MD 06/14/17 1055

## 2017-06-14 LAB — URINALYSIS, ROUTINE W REFLEX MICROSCOPIC
Bilirubin Urine: NEGATIVE
GLUCOSE, UA: NEGATIVE mg/dL
Hgb urine dipstick: NEGATIVE
KETONES UR: NEGATIVE mg/dL
LEUKOCYTES UA: NEGATIVE
NITRITE: NEGATIVE
PROTEIN: NEGATIVE mg/dL
Specific Gravity, Urine: 1.012 (ref 1.005–1.030)
pH: 5 (ref 5.0–8.0)

## 2017-06-14 MED ORDER — SODIUM CHLORIDE 0.9 % IV BOLUS (SEPSIS)
1000.0000 mL | Freq: Once | INTRAVENOUS | Status: AC
  Administered 2017-06-14: 1000 mL via INTRAVENOUS

## 2017-06-14 MED ORDER — METHOCARBAMOL 500 MG PO TABS: 500.0000 mg | ORAL_TABLET | Freq: Two times a day (BID) | ORAL | 0 refills | Status: DC

## 2017-06-14 NOTE — ED Notes (Signed)
Pt stated he is too sore to try to walk.

## 2017-06-14 NOTE — Discharge Instructions (Signed)
1. Medications: robaxin, usual home medications including your gabapentin as prescribed 2. Treatment: rest, drink plenty of fluids, gentle stretching as discussed, alternate ice and heat 3. Follow Up: Please followup with your primary doctor in 3 days for discussion of your diagnoses and further evaluation after today's visit; if you do not have a primary care doctor use the resource guide provided to find one;  Return to the ER for worsening back pain, difficulty walking, loss of bowel or bladder control or other concerning symptoms

## 2017-08-06 ENCOUNTER — Encounter: Payer: Self-pay | Admitting: Internal Medicine

## 2017-08-12 ENCOUNTER — Encounter: Payer: Self-pay | Admitting: Internal Medicine

## 2017-10-29 ENCOUNTER — Ambulatory Visit: Payer: Medicaid Other | Admitting: Family Medicine

## 2017-10-29 VITALS — BP 140/85 | HR 92 | Temp 97.8°F | Ht 71.0 in | Wt 167.2 lb

## 2017-10-29 DIAGNOSIS — G6289 Other specified polyneuropathies: Secondary | ICD-10-CM | POA: Diagnosis not present

## 2017-10-29 DIAGNOSIS — R569 Unspecified convulsions: Secondary | ICD-10-CM | POA: Diagnosis not present

## 2017-10-29 DIAGNOSIS — Z7689 Persons encountering health services in other specified circumstances: Secondary | ICD-10-CM

## 2017-10-29 NOTE — Patient Instructions (Signed)
It was great meeting you today [name]! I am glad that things are going well. I am glad that your seizures have been well-controlled on your seizure medication. I am glad that your pain has been doing well on the gabapentin. I think your main issue is cutting down on the alcohol. Unfortunately you had to go before I had a chance to introduce you to behavioral health. Please schedule an appointment when is convenient with you so that we can discuss this issue further. I gave you a list of free resources in the area to help with alcohol cessation.

## 2017-10-29 NOTE — Progress Notes (Signed)
  HPI:  Patient presents today for a new patient appointment to establish general primary care, also to discuss alcohol cessassion.  Patient seen at urgent care in past for pcp care. Found to have seizure disorder for which he takes dilantin. He also takes gabapentin for peripheral neuropathy.  Since starting these medications he has had no seizures and feels as though the gabapentin has dramatically helped his neuropathy.  His biggest issue is his alcohol abuse. He is interested in really cutting down. States that when he has friend over he can drink up to 3 40oz malt liquors per day. The least he ever drinks is a full 40oz. States that he starts drinking immediately when he wakes up and "sips" throughout the day. At this point in the interview he stated that unfortunately his daughter was waiting on him in the car as she was his ride. I was able to hand him a list of resources for alcohol cessation before he left. Implored him to please schedule another appointment with me ASAP to further discuss. Said he would and then walked out.  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  Past Surgical Hx:  -none  Family Hx: updated in Epic  Social Hx: lives at home alone - drinks up to 3 40 oz malt liquors per day - smokes marijuana occasionally  Health Maintenance:  -needs hep C screening - Needs coloscopy  PHYSICAL EXAM: BP 140/85 (BP Location: Left Arm, Patient Position: Sitting, Cuff Size: Normal)   Pulse 92   Temp 97.8 F (36.6 C) (Oral)   Ht 5\' 11"  (1.803 m)   Wt 167 lb 3.2 oz (75.8 kg)   SpO2 97%   BMI 23.32 kg/m  Gen: frail appearing african [place] male HEENT: noticeably bloodshot eyes bilaterally Gait: No abnormalities with gait  Unfortunately the rest of the exam was deferred as patient needed to leave  ASSESSMENT/PLAN:  # Health maintenance:  -needs hep C screening - Needs coloscopy  Encounter to  establish care Unfortunately not able to give level of etoh cessation counseling patient needs. He had to leave prior to any real advise on this issue. Asked for him to come back as soon as possible. Did give list of free resources for alcohol help. Would likely also benefit from behavioral health as well.  Seizures (HCC) Well controlled on gabapentin and dilantin. Has only been taking dilantin "occasionally". Gave counseling on importance of taking this medication as prescribed as well as importance of drawing blood levels of this medications.  Peripheral neuropathy Greatly improved on gabapentin.     FOLLOW UP: Follow up in 1 week for alcohol counseling  Myrene BuddyJacob Jamaurie Bernier MD PGY-1 Family Medicine Resident

## 2017-11-01 ENCOUNTER — Encounter: Payer: Self-pay | Admitting: Family Medicine

## 2017-11-01 NOTE — Assessment & Plan Note (Signed)
Unfortunately not able to give level of etoh cessation counseling patient needs. He had to leave prior to any real advise on this issue. Asked for him to come back as soon as possible. Did give list of free resources for alcohol help. Would likely also benefit from behavioral health as well.

## 2017-11-01 NOTE — Assessment & Plan Note (Signed)
Greatly improved on gabapentin.

## 2017-11-01 NOTE — Assessment & Plan Note (Signed)
Well controlled on gabapentin and dilantin. Has only been taking dilantin "occasionally". Gave counseling on importance of taking this medication as prescribed as well as importance of drawing blood levels of this medications.

## 2017-11-25 ENCOUNTER — Ambulatory Visit: Payer: Self-pay | Admitting: Family Medicine

## 2017-12-23 ENCOUNTER — Ambulatory Visit: Payer: Medicaid Other | Admitting: Family Medicine

## 2018-01-21 ENCOUNTER — Ambulatory Visit: Payer: Medicaid Other | Admitting: Family Medicine

## 2018-02-16 ENCOUNTER — Ambulatory Visit: Payer: Medicaid Other | Admitting: Family Medicine

## 2018-02-25 ENCOUNTER — Ambulatory Visit: Payer: Medicaid Other | Admitting: Family Medicine

## 2018-02-25 ENCOUNTER — Other Ambulatory Visit: Payer: Self-pay

## 2018-02-25 VITALS — BP 128/72 | HR 76 | Temp 99.0°F | Ht 71.0 in | Wt 168.8 lb

## 2018-02-25 DIAGNOSIS — R319 Hematuria, unspecified: Secondary | ICD-10-CM | POA: Diagnosis present

## 2018-02-25 LAB — POCT URINALYSIS DIP (MANUAL ENTRY)
BILIRUBIN UA: NEGATIVE
Blood, UA: NEGATIVE
Glucose, UA: NEGATIVE mg/dL
Ketones, POC UA: NEGATIVE mg/dL
Leukocytes, UA: NEGATIVE
NITRITE UA: NEGATIVE
PH UA: 5.5 (ref 5.0–8.0)
Spec Grav, UA: >=1.03 — AB (ref 1.010–1.025)
Urobilinogen, UA: 0.2 E.U./dL

## 2018-02-25 NOTE — Progress Notes (Signed)
    Subjective:  Todd Tran is a 57 y.o. male who [facility] today with a chief complaint of gross hematuria.   HPI:  Painless hematuria Patient endorses 1 month of painless hematuria.  He says that it has happened 3 times over the past month.  One time it was "frank blood" the other times it was scant.  Patient has no history of bladder, prostate cancer or renal disease.  Endorses drinking heavily during these times, usually drinks 2 40 ounce beers and 1 bottle of wine a day.  Endorses smoking since 20, pack-year history of a 20 years.  Endorses some increased urinary frequency, denies any dysuria.  He has no back pain, fevers, chills. He is not passed a kidney stone.  ROS: Per HPI. Smoking history review.    Objective:  Physical Exam: BP 128/72   Pulse 76   Temp 99 F (37.2 C) (Oral)   Ht 5\' 11"  (1.803 m)   Wt 168 lb 12.8 oz (76.6 kg)   SpO2 98%   BMI 23.54 kg/m   Gen: NAD, resting comfortably CV: RRR with no murmurs appreciated Pulm: NWOB, CTAB with no crackles, wheezes, or rhonchi GI: Normal bowel sounds present. Soft, Nontender, Nondistended. MSK: no edema, cyanosis, or clubbing noted Skin: warm, dry Neuro: grossly normal, moves all extremities Psych: Normal affect and thought content  Results for orders placed or performed in visit on 02/25/18 (from the past 72 hour(s))  POCT urinalysis dipstick     Status: Abnormal   Collection Time: 02/25/18  9:10 AM  Result Value Ref Range   Color, UA yellow yellow   Clarity, UA clear clear   Glucose, UA negative negative mg/dL   Bilirubin, UA negative negative   Ketones, POC UA negative negative mg/dL   Spec Grav, UA >=1.610>=1.030 (A) 1.010 - 1.025   Blood, UA negative negative   pH, UA 5.5 5.0 - 8.0   Protein Ur, POC trace (A) negative mg/dL   Urobilinogen, UA 0.2 0.2 or 1.0 E.U./dL   Nitrite, UA Negative Negative   Leukocytes, UA Negative Negative     Assessment/Plan:  Hematuria Episodes of gross hematuria.  UA  was normal.  Other than some increased frequency of urination, patient is a symptomatic.  Will culture urine to rule out UTI.  Patient has not had labs drawn in over a year, will get CBC, CMP to evaluate renal and platelet function.  This is especially important given his endorsed alcoholism.  Will refer to urology to further evaluate for anatomical, BPH, bladder malignancy causes.    Lab Orders     Urine Culture     CBC with Differential     Comprehensive metabolic panel     POCT urinalysis dipstick  No orders of the defined types were placed in this encounter.   Thomes DinningBrad Thompson, MD, MS FAMILY MEDICINE RESIDENT - PGY1 02/25/2018 10:58 AM

## 2018-02-25 NOTE — Assessment & Plan Note (Signed)
Episodes of gross hematuria.  UA was normal.  Other than some increased frequency of urination, patient is a symptomatic.  Will culture urine to rule out UTI.  Patient has not had labs drawn in over a year, will get CBC, CMP to evaluate renal and platelet function.  This is especially important given his endorsed alcoholism.  Will refer to urology to further evaluate for anatomical, BPH, bladder malignancy causes.

## 2018-02-25 NOTE — Patient Instructions (Addendum)
Orders Placed This Encounter  Procedures  . Urine Culture    Order Specific Question:   Source    Answer:   Add on  . CBC with Differential  . Comprehensive metabolic panel  . Ambulatory referral to Urology    Referral Priority:   Routine    Referral Type:   Consultation    Referral Reason:   Specialty Services Required    Requested Specialty:   Urology    Number of Visits Requested:   1  . POCT urinalysis dipstick    Hematuria, Adult Hematuria is blood in your urine. It can be caused by a bladder infection, kidney infection, prostate infection, kidney stone, or cancer of your urinary tract. Infections can usually be treated with medicine, and a kidney stone usually will pass through your urine. If neither of these is the cause of your hematuria, further workup to find out the reason may be needed. It is very important that you tell your health care provider about any blood you see in your urine, even if the blood stops without treatment or happens without causing pain. Blood in your urine that happens and then stops and then happens again can be a symptom of a very serious condition. Also, pain is not a symptom in the initial stages of many urinary cancers. Follow these instructions at home:  Drink lots of fluid, 3-4 quarts a day. If you have been diagnosed with an infection, cranberry juice is especially recommended, in addition to large amounts of water.  Avoid caffeine, tea, and carbonated beverages because they tend to irritate the bladder.  Avoid alcohol because it may irritate the prostate.  Take all medicines as directed by your health care provider.  If you were prescribed an antibiotic medicine, finish it all even if you start to feel better.  If you have been diagnosed with a kidney stone, follow your health care provider's instructions regarding straining your urine to catch the stone.  Empty your bladder often. Avoid holding urine for long periods of time.  After a  bowel movement, women should cleanse front to back. Use each tissue only once.  Empty your bladder before and after sexual intercourse if you are a male. Contact a health care provider if:  You develop back pain.  You have a fever.  You have a feeling of sickness in your stomach (nausea) or vomiting.  Your symptoms are not better in 3 days. Return sooner if you are getting worse. Get help right away if:  You develop severe vomiting and are unable to keep the medicine down.  You develop severe back or abdominal pain despite taking your medicines.  You begin passing a large amount of blood or clots in your urine.  You feel extremely weak or faint, or you pass out. This information is not intended to replace advice given to you by your health care provider. Make sure you discuss any questions you have with your health care provider. Document Released: 08/10/2005 Document Revised: 01/16/2016 Document Reviewed: 04/10/2013 Elsevier Interactive Patient Education  2017 [employer]

## 2018-02-26 LAB — COMPREHENSIVE METABOLIC PANEL
ALT: 32 IU/L (ref 0–44)
AST: 81 IU/L — ABNORMAL HIGH (ref 0–40)
Albumin/Globulin Ratio: 1 — ABNORMAL LOW (ref 1.2–2.2)
Albumin: 3.9 g/dL (ref 3.5–5.5)
Alkaline Phosphatase: 109 IU/L (ref 39–117)
BILIRUBIN TOTAL: 0.5 mg/dL (ref 0.0–1.2)
BUN/Creatinine Ratio: 6 — ABNORMAL LOW (ref 9–20)
BUN: 5 mg/dL — AB (ref 6–24)
CALCIUM: 9.5 mg/dL (ref 8.7–10.2)
CHLORIDE: 101 mmol/L (ref 96–106)
CO2: 26 mmol/L (ref 20–29)
CREATININE: 0.85 mg/dL (ref 0.76–1.27)
GFR calc Af Amer: 113 mL/min/{1.73_m2} (ref >59)
GFR calc non Af Amer: 97 mL/min/{1.73_m2} (ref >59)
GLUCOSE: 94 mg/dL (ref 65–99)
Globulin, Total: 4.1 g/dL (ref 1.5–4.5)
Potassium: 4.6 mmol/L (ref 3.5–5.2)
Sodium: 141 mmol/L (ref 134–144)
TOTAL PROTEIN: 8 g/dL (ref 6.0–8.5)

## 2018-02-26 LAB — CBC WITH DIFFERENTIAL/PLATELET
BASOS ABS: 0.1 10*3/uL (ref 0.0–0.2)
Basos: 1 %
EOS (ABSOLUTE): 0.2 10*3/uL (ref 0.0–0.4)
Eos: 4 %
Hematocrit: 40.5 % (ref 37.5–51.0)
Hemoglobin: 13.5 g/dL (ref 13.0–17.7)
IMMATURE GRANS (ABS): 0 10*3/uL (ref 0.0–0.1)
IMMATURE GRANULOCYTES: 0 %
LYMPHS: 43 %
Lymphocytes Absolute: 1.8 10*3/uL (ref 0.7–3.1)
MCH: 30.5 pg (ref 26.6–33.0)
MCHC: 33.3 g/dL (ref 31.5–35.7)
MCV: 91 fL (ref 79–97)
MONOCYTES: 11 %
Monocytes Absolute: 0.4 10*3/uL (ref 0.1–0.9)
NEUTROS PCT: 41 %
Neutrophils Absolute: 1.7 10*3/uL (ref 1.4–7.0)
PLATELETS: 134 10*3/uL — AB (ref 150–450)
RBC: 4.43 x10E6/uL (ref 4.14–5.80)
RDW: 13.7 % (ref 12.3–15.4)
WBC: 4.2 10*3/uL (ref 3.4–10.8)

## 2018-02-27 LAB — URINE CULTURE: ORGANISM ID, BACTERIA: NO GROWTH

## 2018-03-03 ENCOUNTER — Telehealth: Payer: Self-pay

## 2018-03-03 NOTE — Telephone Encounter (Signed)
Calling for lab results from 02/25/18.  Call back is [phone]  Ples SpecterAlisa Brake, RN [facility] RN)

## 2018-03-04 NOTE — Telephone Encounter (Signed)
Patient aware. Brittania Sudbeck, RN ([facility] RN)  

## 2018-03-04 NOTE — Telephone Encounter (Signed)
Number busy will try again later this afternoon.  Jazmin Hartsell,CMA

## 2018-03-09 ENCOUNTER — Ambulatory Visit (INDEPENDENT_AMBULATORY_CARE_PROVIDER_SITE_OTHER): Payer: Medicaid Other | Admitting: Family Medicine

## 2018-03-09 ENCOUNTER — Encounter: Payer: Self-pay | Admitting: Family Medicine

## 2018-03-09 VITALS — BP 140/80 | HR 80 | Temp 98.5°F | Wt 169.8 lb

## 2018-03-09 DIAGNOSIS — F1020 Alcohol dependence, uncomplicated: Secondary | ICD-10-CM | POA: Diagnosis not present

## 2018-03-09 DIAGNOSIS — R319 Hematuria, unspecified: Secondary | ICD-10-CM | POA: Diagnosis not present

## 2018-03-09 DIAGNOSIS — F4321 Adjustment disorder with depressed mood: Secondary | ICD-10-CM | POA: Diagnosis not present

## 2018-03-09 DIAGNOSIS — R569 Unspecified convulsions: Secondary | ICD-10-CM | POA: Diagnosis not present

## 2018-03-09 NOTE — Patient Instructions (Signed)
It was great seeing you today Todd Tran! As we discussed, we are still waiting on Urology to get back to you for further workup for the blood in your urine. I will check out the hard, painless nodule in your groin next time I see you. We can also get you set up with our behavioral health specialists at that time.

## 2018-03-10 ENCOUNTER — Encounter: Payer: Self-pay | Admitting: Family Medicine

## 2018-03-10 DIAGNOSIS — F1020 Alcohol dependence, uncomplicated: Secondary | ICD-10-CM | POA: Insufficient documentation

## 2018-03-10 DIAGNOSIS — F4321 Adjustment disorder with depressed mood: Secondary | ICD-10-CM | POA: Insufficient documentation

## 2018-03-10 NOTE — Progress Notes (Signed)
   HPI 57 year old male who presents with his niece. She she is interested in what his hematuria workup showed. Explained that he did not have any bleeding on his UA, and that he does not have a urinary tract infection by that same UA. Explained that at the last visit a referral was made to urology for continued workup. Patient is still waiting to hear back from that referral.  Patient states that he has been under a lot of stress recently as his uncle passed away and his brother was hospitalized all within the same time frame. He admitted that he has been drinking alcohol a lot recently as a coping mechanism. He says that fortunately his brother is doing well. He is having some trouble coping with the loss of his uncle but states that he is improving. Offered patient visit with behavioral health but stated he was in a hurry and declined. Is interested in behavioral health referral and would like to meet with them at next visit.   Has had no seizures since last appointment with me. Is not taking his dilantin.  CC: hematuria follow up  ROS:  Review of Systems See HPI for ROS.   CC, SH/smoking status, and VS noted  Objective: BP 140/80 (BP Location: Right Arm, Patient Position: Sitting, Cuff Size: Normal)   Pulse 80   Temp 98.5 F (36.9 C) (Oral)   Wt 169 lb 12.8 oz (77 kg)   SpO2 98%   BMI 23.68 kg/m  Gen: NAD, alert, cooperative, and pleasant. Well appearing AA male. Resting comfortably in chair. No distres HEENT: NCAT, EOMI, PERRL. Right eye noted to have lateral gaze. CV: RRR, no murmur Resp: CTAB, no wheezes, non-labored Abd: SNTND, BS present, no guarding or organomegaly Ext: No edema, warm Neuro: Alert and oriented, Speech clear, No gross deficits   Assessment and plan:  Seizures (HCC) No seizures. Not taking dilantin but is taking gabapentin. Counseled that alcohol will pre-dispose him to seizures and that he should take his dilantin as prescribed.  Hematuria Has had a  few more episodes of hematuria. Still awaiting urology referral. Given his smoking and alcoholism he is at high risk for possible urologic malignancy. - follow up urology referral - return to clinic as needed  Grief Has been using increased alcohol as coping mechanism for his grief. Discussed healthier coping mechanisms with patient. States the will cut back. Declined behavioral health referral but is interested at next visit.  Alcoholism (HCC) Increased etoh intake tied to grief. Gave guidance on more healthy coping mechanisms. Declined behavioral health referral. Also declined discussing in depth, stating that he had to leave.   No orders of the defined types were placed in this encounter.   No orders of the defined types were placed in this encounter.    Myrene BuddyJacob Jaqua Ching MD PGY-2 Family Medicine Resident  03/10/2018 12:41 PM

## 2018-03-10 NOTE — Assessment & Plan Note (Signed)
Has had a few more episodes of hematuria. Still awaiting urology referral. Given his smoking and alcoholism he is at high risk for possible urologic malignancy. - follow up urology referral - return to clinic as needed

## 2018-03-10 NOTE — Assessment & Plan Note (Signed)
Increased etoh intake tied to grief. Gave guidance on more healthy coping mechanisms. Declined behavioral health referral. Also declined discussing in depth, stating that he had to leave.

## 2018-03-10 NOTE — Assessment & Plan Note (Signed)
Has been using increased alcohol as coping mechanism for his grief. Discussed healthier coping mechanisms with patient. States the will cut back. Declined behavioral health referral but is interested at next visit.

## 2018-03-10 NOTE — Assessment & Plan Note (Signed)
No seizures. Not taking dilantin but is taking gabapentin. Counseled that alcohol will pre-dispose him to seizures and that he should take his dilantin as prescribed.

## 2018-03-31 ENCOUNTER — Ambulatory Visit: Payer: Self-pay | Admitting: Family Medicine

## 2018-09-02 ENCOUNTER — Ambulatory Visit: Payer: Medicaid Other | Admitting: Family Medicine

## 2018-09-16 ENCOUNTER — Ambulatory Visit: Payer: Medicaid Other | Admitting: Family Medicine

## 2018-10-07 ENCOUNTER — Ambulatory Visit: Payer: Medicaid Other | Admitting: Family Medicine

## 2018-10-21 ENCOUNTER — Encounter: Payer: Self-pay | Admitting: Family Medicine

## 2018-10-21 ENCOUNTER — Ambulatory Visit (INDEPENDENT_AMBULATORY_CARE_PROVIDER_SITE_OTHER): Payer: Medicaid Other | Admitting: Family Medicine

## 2018-10-21 ENCOUNTER — Other Ambulatory Visit: Payer: Self-pay

## 2018-10-21 VITALS — BP 126/72 | Temp 97.8°F | Ht 71.0 in | Wt 180.0 lb

## 2018-10-21 DIAGNOSIS — Z23 Encounter for immunization: Secondary | ICD-10-CM | POA: Diagnosis not present

## 2018-10-21 DIAGNOSIS — R319 Hematuria, unspecified: Secondary | ICD-10-CM

## 2018-10-21 DIAGNOSIS — K4090 Unilateral inguinal hernia, without obstruction or gangrene, not specified as recurrent: Secondary | ICD-10-CM

## 2018-10-21 DIAGNOSIS — Z0000 Encounter for general adult medical examination without abnormal findings: Secondary | ICD-10-CM

## 2018-10-21 MED ORDER — ZOSTER VAC RECOMB ADJUVANTED 50 MCG/0.5ML IM SUSR: 0.5000 mL | Freq: Once | INTRAMUSCULAR | 0 refills | Status: AC

## 2018-10-21 NOTE — Assessment & Plan Note (Signed)
Moderate sized right inguinal indirect hernia.  Moderate size defect, no symptoms alarming for incarceration or obstruction.  Patient aware supportive jockstrap to help slow rate of herniation.  Reviewed alarm signs with patient, reviewed return precautions.

## 2018-10-21 NOTE — Patient Instructions (Signed)
It was great seeing you again today!  While there is a fairly large hernia, I do not think it needs to be repaired by surgery at this point.  We reviewed the reasons why you need to be seen by Dr. for the hernia, medical is constipation and severe pain.  Continue wearing your jockstrap for support, and come back and see me if this also bothers you.  I gave you a prescription for the shingles vaccine.  That should prevent future shingles infections.  We also give you a flu shot today.  I am glad that you are blood in the urine has resolved.  Make sure to schedule an appointment with urology the future.

## 2018-10-21 NOTE — Progress Notes (Signed)
   HPI This 58 year old male who presents for right inguinal hernia.  States his hernia has been present for "a few months".  Patient states that he has been trying to see me for around a month, but he was recently incarcerated this prevented his visit.  States that the hernia has been slowly increasing in size.  Has not caused him any constipation or diarrhea.  He has had no pain in the site.  States that while he was incarcerated he was given a jock strap and this helped support the area some.  Patient states that he has not had any hematuria recently.  He had an appointment scheduled with urology but was unfortunately incarcerated which prevent him from going.  He needs to call and reschedule an appointment.  He states that while he was incarcerated he had shingles outbreak.  He states that it was at his T3 vertebral level.  He was given "some medication" while he was incarcerated and this helped to go away.  CC: Right hernia   ROS:   Review of Systems See HPI for ROS.   CC, SH/smoking status, and VS noted  Objective: BP 126/72 (BP Location: Right Arm, Patient Position: Sitting, Cuff Size: Normal)   Temp 97.8 F (36.6 C) (Oral)   Ht 5\' 11"  (1.803 m)   Wt 180 lb (81.6 kg)   BMI 25.10 kg/m  Gen: Pleasant 58 year old African-American male, no acute distress, resting comfortably CV: RRR, no murmur Resp: CTAB, no wheezes, non-labored Abd: SNTND, BS present, no guarding or organomegaly GU: Moderate sized indirect inguinal hernia, no pain on palpation, defect appreciated. Neuro: Alert and oriented, Speech clear, No gross deficits   Assessment and plan:  Right inguinal hernia Moderate sized right inguinal indirect hernia.  Moderate size defect, no symptoms alarming for incarceration or obstruction.  Patient aware supportive jockstrap to help slow rate of herniation.  Reviewed alarm signs with patient, reviewed return precautions.  Need for prophylactic vaccination and inoculation  against varicella Patient greater than 70 years old.  By history he had shingles outbreak while incarcerated in prison.  Gave printed prescription for Shingrix, should be covered Medicaid.  Hematuria Patient states this has stopped.  He did have an appoint with urology but due to his incarceration was unable to keep that appointment.  He is going to call and reschedule.  Need for immunization against influenza Flu vaccine given   Orders Placed This Encounter  Procedures  . Flu Vaccine QUAD 36+ mos IM    Meds ordered this encounter  Medications  . Zoster Vaccine Adjuvanted [facility]) injection    Sig: Inject 0.5 mLs into the muscle once for 1 dose.    Dispense:  0.5 mL    Refill:  0     Myrene Buddy MD PGY-2 Family Medicine Resident  10/21/2018 4:36 PM

## 2018-10-21 NOTE — Assessment & Plan Note (Signed)
Flu vaccine given.

## 2018-10-21 NOTE — Assessment & Plan Note (Signed)
Patient states this has stopped.  He did have an appoint with urology but due to his incarceration was unable to keep that appointment.  He is going to call and reschedule.

## 2018-10-21 NOTE — Assessment & Plan Note (Signed)
Patient greater than 57 years old.  By history he had shingles outbreak while incarcerated in prison.  Gave printed prescription for Shingrix, should be covered Medicaid.

## 2019-06-13 ENCOUNTER — Ambulatory Visit (INDEPENDENT_AMBULATORY_CARE_PROVIDER_SITE_OTHER): Payer: Medicaid Other | Admitting: Family Medicine

## 2019-06-13 ENCOUNTER — Other Ambulatory Visit: Payer: Self-pay

## 2019-06-13 ENCOUNTER — Encounter: Payer: Self-pay | Admitting: Family Medicine

## 2019-06-13 VITALS — BP 110/80 | HR 100 | Temp 97.8°F | Ht 71.0 in | Wt 143.0 lb

## 2019-06-13 DIAGNOSIS — R569 Unspecified convulsions: Secondary | ICD-10-CM

## 2019-06-13 DIAGNOSIS — Z1211 Encounter for screening for malignant neoplasm of colon: Secondary | ICD-10-CM

## 2019-06-13 DIAGNOSIS — R634 Abnormal weight loss: Secondary | ICD-10-CM

## 2019-06-13 DIAGNOSIS — E785 Hyperlipidemia, unspecified: Secondary | ICD-10-CM

## 2019-06-13 DIAGNOSIS — Z1159 Encounter for screening for other viral diseases: Secondary | ICD-10-CM

## 2019-06-13 DIAGNOSIS — R630 Anorexia: Secondary | ICD-10-CM

## 2019-06-13 DIAGNOSIS — Z23 Encounter for immunization: Secondary | ICD-10-CM | POA: Diagnosis not present

## 2019-06-13 DIAGNOSIS — G6289 Other specified polyneuropathies: Secondary | ICD-10-CM

## 2019-06-13 DIAGNOSIS — F1020 Alcohol dependence, uncomplicated: Secondary | ICD-10-CM

## 2019-06-13 NOTE — Patient Instructions (Addendum)
It is great seeing you again today!  I am sorry to hear about your lack of appetite and your weight loss.  Think it is absolutely imperative for you to get a colonoscopy better evaluate this.  Put in a referral for a GI specialist, they should give you a call get appointment scheduled soon.  Can also get some lab work to better evaluate this.  Received a flu vaccine today.  Some I will be in touch the results

## 2019-06-14 LAB — COMPREHENSIVE METABOLIC PANEL
ALT: 38 IU/L (ref 0–44)
AST: 78 IU/L — ABNORMAL HIGH (ref 0–40)
Albumin/Globulin Ratio: 0.9 — ABNORMAL LOW (ref 1.2–2.2)
Albumin: 3.8 g/dL (ref 3.8–4.9)
Alkaline Phosphatase: 149 IU/L — ABNORMAL HIGH (ref 39–117)
BUN/Creatinine Ratio: 10 (ref 9–20)
BUN: 8 mg/dL (ref 6–24)
Bilirubin Total: 2 mg/dL — ABNORMAL HIGH (ref 0.0–1.2)
CO2: 25 mmol/L (ref 20–29)
Calcium: 9.5 mg/dL (ref 8.7–10.2)
Chloride: 86 mmol/L — ABNORMAL LOW (ref 96–106)
Creatinine, Ser: 0.77 mg/dL (ref 0.76–1.27)
GFR calc Af Amer: 116 mL/min/{1.73_m2} (ref >59)
GFR calc non Af Amer: 101 mL/min/{1.73_m2} (ref >59)
Globulin, Total: 4.3 g/dL (ref 1.5–4.5)
Glucose: 124 mg/dL — ABNORMAL HIGH (ref 65–99)
Potassium: 3.4 mmol/L — ABNORMAL LOW (ref 3.5–5.2)
Sodium: 132 mmol/L — ABNORMAL LOW (ref 134–144)
Total Protein: 8.1 g/dL (ref 6.0–8.5)

## 2019-06-14 LAB — LIPID PANEL
Chol/HDL Ratio: 3.2 ratio (ref 0.0–5.0)
Cholesterol, Total: 139 mg/dL (ref 100–199)
HDL: 44 mg/dL (ref >39)
LDL Chol Calc (NIH): 82 mg/dL (ref 0–99)
Triglycerides: 65 mg/dL (ref 0–149)
VLDL Cholesterol Cal: 13 mg/dL (ref 5–40)

## 2019-06-14 LAB — B12 AND FOLATE PANEL
Folate: 2.3 ng/mL — ABNORMAL LOW (ref >3.0)
Vitamin B-12: 686 pg/mL (ref 232–1245)

## 2019-06-14 LAB — HEPATITIS C ANTIBODY: Hep C Virus Ab: 0.1 s/co ratio (ref 0.0–0.9)

## 2019-06-15 ENCOUNTER — Other Ambulatory Visit: Payer: Self-pay

## 2019-06-15 ENCOUNTER — Encounter (HOSPITAL_COMMUNITY): Payer: Self-pay | Admitting: Emergency Medicine

## 2019-06-15 ENCOUNTER — Emergency Department (HOSPITAL_COMMUNITY): Payer: Medicaid Other

## 2019-06-15 ENCOUNTER — Emergency Department (HOSPITAL_COMMUNITY)
Admission: EM | Admit: 2019-06-15 | Discharge: 2019-06-16 | Disposition: A | Discharge status: Home / Self Care | Payer: Medicaid Other | Attending: Emergency Medicine | Admitting: Emergency Medicine

## 2019-06-15 DIAGNOSIS — R630 Anorexia: Secondary | ICD-10-CM | POA: Insufficient documentation

## 2019-06-15 DIAGNOSIS — I10 Essential (primary) hypertension: Secondary | ICD-10-CM | POA: Insufficient documentation

## 2019-06-15 DIAGNOSIS — R066 Hiccough: Secondary | ICD-10-CM

## 2019-06-15 DIAGNOSIS — F17210 Nicotine dependence, cigarettes, uncomplicated: Secondary | ICD-10-CM | POA: Insufficient documentation

## 2019-06-15 DIAGNOSIS — E876 Hypokalemia: Secondary | ICD-10-CM | POA: Insufficient documentation

## 2019-06-15 DIAGNOSIS — F10230 Alcohol dependence with withdrawal, uncomplicated: Secondary | ICD-10-CM | POA: Insufficient documentation

## 2019-06-15 DIAGNOSIS — R634 Abnormal weight loss: Secondary | ICD-10-CM | POA: Insufficient documentation

## 2019-06-15 DIAGNOSIS — R112 Nausea with vomiting, unspecified: Secondary | ICD-10-CM | POA: Insufficient documentation

## 2019-06-15 DIAGNOSIS — R251 Tremor, unspecified: Secondary | ICD-10-CM | POA: Diagnosis not present

## 2019-06-15 LAB — COMPREHENSIVE METABOLIC PANEL
ALT: 36 U/L (ref 0–44)
AST: 66 U/L — ABNORMAL HIGH (ref 15–41)
Albumin: 3.1 g/dL — ABNORMAL LOW (ref 3.5–5.0)
Alkaline Phosphatase: 115 U/L (ref 38–126)
Anion gap: 16 — ABNORMAL HIGH (ref 5–15)
BUN: 8 mg/dL (ref 6–20)
CO2: 28 mmol/L (ref 22–32)
Calcium: 9.4 mg/dL (ref 8.9–10.3)
Chloride: 87 mmol/L — ABNORMAL LOW (ref 98–111)
Creatinine, Ser: 0.8 mg/dL (ref 0.61–1.24)
GFR calc Af Amer: >60 mL/min (ref >60)
GFR calc non Af Amer: >60 mL/min (ref >60)
Glucose, Bld: 129 mg/dL — ABNORMAL HIGH (ref 70–99)
Potassium: 2.7 mmol/L — CL (ref 3.5–5.1)
Sodium: 131 mmol/L — ABNORMAL LOW (ref 135–145)
Total Bilirubin: 2.7 mg/dL — ABNORMAL HIGH (ref 0.3–1.2)
Total Protein: 8.2 g/dL — ABNORMAL HIGH (ref 6.5–8.1)

## 2019-06-15 LAB — URINALYSIS, ROUTINE W REFLEX MICROSCOPIC
Bacteria, UA: NONE SEEN
Glucose, UA: NEGATIVE mg/dL
Ketones, ur: NEGATIVE mg/dL
Leukocytes,Ua: NEGATIVE
Nitrite: NEGATIVE
Protein, ur: 30 mg/dL — AB
Specific Gravity, Urine: 1.03 (ref 1.005–1.030)
pH: 5 (ref 5.0–8.0)

## 2019-06-15 LAB — CBC
HCT: 40.3 % (ref 39.0–52.0)
Hemoglobin: 13.8 g/dL (ref 13.0–17.0)
MCH: 32.8 pg (ref 26.0–34.0)
MCHC: 34.2 g/dL (ref 30.0–36.0)
MCV: 95.7 fL (ref 80.0–100.0)
Platelets: 149 10*3/uL — ABNORMAL LOW (ref 150–400)
RBC: 4.21 MIL/uL — ABNORMAL LOW (ref 4.22–5.81)
RDW: 13 % (ref 11.5–15.5)
WBC: 7.2 10*3/uL (ref 4.0–10.5)
nRBC: 0 % (ref 0.0–0.2)

## 2019-06-15 LAB — LIPASE, BLOOD: Lipase: 33 U/L (ref 11–51)

## 2019-06-15 LAB — MAGNESIUM: Magnesium: 1.2 mg/dL — ABNORMAL LOW (ref 1.7–2.4)

## 2019-06-15 MED ORDER — POTASSIUM CHLORIDE CRYS ER 20 MEQ PO TBCR
40.0000 meq | EXTENDED_RELEASE_TABLET | Freq: Once | ORAL | Status: AC
  Administered 2019-06-15: 40 meq via ORAL
  Filled 2019-06-15: qty 2

## 2019-06-15 MED ORDER — ONDANSETRON 4 MG PO TBDP: 4.0000 mg | ORAL_TABLET | Freq: Once | ORAL | Status: DC | PRN

## 2019-06-15 MED ORDER — ONDANSETRON 4 MG PO TBDP
ORAL_TABLET | ORAL | Status: AC
Start: 2019-06-15 — End: 2019-06-16
  Filled 2019-06-15: qty 1

## 2019-06-15 MED ORDER — METOCLOPRAMIDE HCL 5 MG/ML IJ SOLN
10.0000 mg | Freq: Once | INTRAMUSCULAR | Status: AC
  Administered 2019-06-15: 10 mg via INTRAVENOUS
  Filled 2019-06-15: qty 2

## 2019-06-15 MED ORDER — DIPHENHYDRAMINE HCL 50 MG/ML IJ SOLN
25.0000 mg | Freq: Once | INTRAMUSCULAR | Status: AC
  Administered 2019-06-15: 25 mg via INTRAVENOUS
  Filled 2019-06-15: qty 1

## 2019-06-15 MED ORDER — SODIUM CHLORIDE 0.9% FLUSH: 3.0000 mL | Freq: Once | INTRAVENOUS | Status: DC

## 2019-06-15 MED ORDER — MAGNESIUM SULFATE 2 GM/50ML IV SOLN
2.0000 g | Freq: Once | INTRAVENOUS | Status: AC
  Administered 2019-06-16: 2 g via INTRAVENOUS
  Filled 2019-06-15: qty 50

## 2019-06-15 MED ORDER — SODIUM CHLORIDE 0.9 % IV BOLUS
1000.0000 mL | Freq: Once | INTRAVENOUS | Status: AC
  Administered 2019-06-15: 1000 mL via INTRAVENOUS

## 2019-06-15 NOTE — Assessment & Plan Note (Signed)
Gave brief counseling on alcohol cessation.  Strongly suspect patient is using more alcohol than he led me to believe.  We will get B12 and folate to further characterize nutritional deficiencies.  I believe this is contributing somewhat to his lack of appetite and poor nutritional status

## 2019-06-15 NOTE — Assessment & Plan Note (Signed)
Likely somewhat due to chronic alcoholism.  Patient certainly has risk factors for gastrointestinal malignancy.  Has never had colonoscopy.  Was able to convince patient to undergo colonoscopy.  Referral placed.  Will get CMP to evaluate for any electrolyte abnormalities in the meantime.

## 2019-06-15 NOTE — Assessment & Plan Note (Signed)
Patient with approximately 40 pound weight loss over the last 10 months.  The weight loss is unintentional.  Will refer to GI for colonoscopy to better evaluate for gastrointestinal malignancy.  Gave brief alcohol counseling.

## 2019-06-15 NOTE — Progress Notes (Signed)
HPI 58 year old male who presents for checkup.  He states that his main complaint is a lack of appetite for the last couple of weeks and weight loss.  Upon chart review patient has lost about 40 pounds over the last 8 months.  He states he has been drinking 4-5 beers per day, along with liquor.  He describes his lack of appetite as being patient is of nausea whenever food he normally enjoyed his place in front of him.  He has not been eating much recently, but has been hydrating with the alcohol.  Patient denies any bleeding per rectum, hematemesis, or substantial nausea vomiting.  Patient has never had a colonoscopy before.  I did briefly discuss alcohol cessation with the patient, but he is not interested in talk about that.  Patient does describe a neuropathic type pain bilateral lower extremity.  He also states he sometimes "has trouble controlling his right leg".  CC: Checkup, lack of appetite   ROS:   Review of Systems See HPI for ROS.   CC, SH/smoking status, and VS noted  Objective: BP 110/80   Pulse 100   Temp 97.8 F (36.6 C) (Oral)   Ht 5\' 11"  (1.803 m)   Wt 143 lb (64.9 kg)   SpO2 99%   BMI 19.94 kg/m  Gen: 58 year old African-American male, no acute stress, resting comfortably HEENT: Moist mucous membranes, no cervical adenopathy CV: Regular rhythm, tachycardic, no M/R/G Resp: Lungs clear to auscultation bilaterally, no accessory muscle use Abd: Soft, nontender, nondistended Neuro: CN II through XII intact, wide-based gait, no abnormalities with finger-to-nose testing, no abnormality with rapid alternating movement testing, no focal neurologic deficit   Assessment and plan:  Alcoholism (HCC) Gave brief counseling on alcohol cessation.  Strongly suspect patient is using more alcohol than he led me to believe.  We will get B12 and folate to further characterize nutritional deficiencies.  I believe this is contributing somewhat to his lack of appetite and poor  nutritional status  Peripheral neuropathy Known peripheral neuropathy.  Will test folate and B12 given chronic alcoholism.  Can consider getting A1c check at next visit, normal A1c with tests around a year ago.  Poor appetite Likely somewhat due to chronic alcoholism.  Patient certainly has risk factors for gastrointestinal malignancy.  Has never had colonoscopy.  Was able to convince patient to undergo colonoscopy.  Referral placed.  Will get CMP to evaluate for any electrolyte abnormalities in the meantime.  Weight loss Patient with approximately 40 pound weight loss over the last 10 months.  The weight loss is unintentional.  Will refer to GI for colonoscopy to better evaluate for gastrointestinal malignancy.  Gave brief alcohol counseling.  Seizures (HCC) Well-controlled at this point.  Has not been taking any Dilantin.  Not had any seizures since 2018.  Seizure was felt due to be alcohol induced.   Orders Placed This Encounter  Procedures  . Flu Vaccine QUAD 36+ mos IM  . Comprehensive metabolic panel    Order Specific Question:   Has the patient fasted?    Answer:   No  . B12 and Folate Panel  . Lipid panel    Order Specific Question:   Has the patient fasted?    Answer:   No  . Hepatitis C antibody  . Ambulatory referral to Gastroenterology    Referral Priority:   Routine    Referral Type:   Consultation    Referral Reason:   Specialty Services Required  Number of Visits Requested:   1    No orders of the defined types were placed in this encounter.    Guadalupe Dawn MD PGY-3 Family Medicine Resident  06/15/2019 3:40 PM

## 2019-06-15 NOTE — Assessment & Plan Note (Signed)
Known peripheral neuropathy.  Will test folate and B12 given chronic alcoholism.  Can consider getting A1c check at next visit, normal A1c with tests around a year ago.

## 2019-06-15 NOTE — Assessment & Plan Note (Signed)
Well-controlled at this point.  Has not been taking any Dilantin.  Not had any seizures since 2018.  Seizure was felt due to be alcohol induced.

## 2019-06-15 NOTE — ED Triage Notes (Signed)
Pt here with c/o mid abd pain and nausea  and vomiting , pt was seen at family practice on Tuesday and is trying to get an appointment with GI

## 2019-06-15 NOTE — ED Notes (Signed)
Aspen pts niece please update

## 2019-06-16 MED ORDER — CHLORDIAZEPOXIDE HCL 25 MG PO CAPS: ORAL_CAPSULE | ORAL | 0 refills | Status: AC

## 2019-06-16 MED ORDER — POTASSIUM CHLORIDE CRYS ER 20 MEQ PO TBCR: 20.0000 meq | EXTENDED_RELEASE_TABLET | Freq: Two times a day (BID) | ORAL | 0 refills | Status: DC

## 2019-06-16 MED ORDER — CHLORDIAZEPOXIDE HCL 25 MG PO CAPS: ORAL_CAPSULE | ORAL | 0 refills | Status: DC

## 2019-06-16 MED ORDER — METOCLOPRAMIDE HCL 10 MG PO TABS: 10.0000 mg | ORAL_TABLET | Freq: Four times a day (QID) | ORAL | 0 refills | Status: DC

## 2019-06-16 MED ORDER — PANTOPRAZOLE SODIUM 20 MG PO TBEC: 40.0000 mg | DELAYED_RELEASE_TABLET | Freq: Every day | ORAL | 0 refills | Status: DC

## 2019-06-16 NOTE — ED Notes (Signed)
Pt called niece with this RN's phone to come pick him up. No WOW available for esignature. Pt wheeled to lobby to wait for ride. Patient verbalizes understanding of discharge instructions. Opportunity for questioning and answers were provided. Armband removed by staff, pt discharged from ED by wheelchair

## 2019-06-16 NOTE — ED Provider Notes (Signed)
[facility] EMERGENCY DEPARTMENT Provider Note   CSN: [id number] Arrival date & time: 06/15/19  1513     History   Chief Complaint No chief complaint on file.   HPI Todd Tran is a 58 y.o. male.      Illness Location:  Hiccups Quality:  Sporadic Severity:  Moderate Onset quality:  Gradual Duration:  10 days Timing:  Intermittent Progression:  Waxing and waning Chronicity:  Recurrent Context:  Patient states that he has been cutting back on his alcohol consumption and states that he has had hiccups related to alcohol withdrawal in the past Relieved by:  Nothing Worsened by:  Nothing Ineffective treatments:  None tried Associated symptoms: nausea   Associated symptoms: no abdominal pain, no chest pain, no ear pain, no fever, no loss of consciousness, no rash, no shortness of breath, no sore throat and no vomiting     Past Medical History:  Diagnosis Date  . Alcohol related seizure (HCC)    h/o withdrawal sz/notes 04/17/2013  . ETOH abuse   . 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 Hypertension     Patient Active Problem List   Diagnosis Date Noted  . Poor appetite 06/15/2019  . Weight loss 06/15/2019  . Right inguinal hernia 10/21/2018  . Need for prophylactic vaccination and inoculation against varicella 10/21/2018  . Need for immunization against influenza 10/21/2018  . Alcoholism (HCC) 03/10/2018  . Hematuria 02/25/2018  . Osteoarthritis of left knee 08/07/2016  . Peripheral neuropathy 08/07/2016  . Seizures (HCC) 04/24/2016  . Alcohol use disorder 12/01/2012    Past Surgical History:  Procedure Laterality Date  . NO PAST SURGERIES          Home Medications    Prior to Admission medications   Medication Sig Start Date End Date Taking? Authorizing Provider  chlordiazePOXIDE (LIBRIUM) 25 MG capsule Take 2 capsules (50 mg total) by mouth every 6 (six) hours for 1  day, THEN 1 capsule (25 mg total) every 6 (six) hours for 1 day, THEN 1 capsule (25 mg total) every 12 (twelve) hours for 1 day, THEN 1 capsule (25 mg total) at bedtime for 1 day. 06/16/19 06/20/19  Alvira Monday, MD  metoCLOPramide (REGLAN) 10 MG tablet Take 1 tablet (10 mg total) by mouth every 6 (six) hours. 06/16/19   Alvira Monday, MD  pantoprazole (PROTONIX) 20 MG tablet Take 2 tablets (40 mg total) by mouth daily for 14 days. 06/16/19 06/30/19  Alvira Monday, MD  potassium chloride SA (KLOR-CON) 20 MEQ tablet Take 1 tablet (20 mEq total) by mouth 2 (two) times daily for 5 days. 06/16/19 06/21/19  Alvira Monday, MD    Family History Family History  Problem Relation Age of Onset  . Diabetes Mother   . Diabetes Sister     Social History Social History   Tobacco Use  . Smoking status: Current Every Day Smoker    Packs/day: 0.25    Years: 30.00    Pack years: 7.50    Types: Cigarettes    Last attempt to quit: 01/22/2013    Years since quitting: 6.4  . Smokeless tobacco: Never Used  Substance Use Topics  . Alcohol use: Yes    Comment: currently 3x40oz beers per day, used to be "a lot more"  . Drug use: No     Allergies   Patient has no known allergies.   Review  of Systems Review of Systems  Constitutional: Negative for chills and fever.  HENT: Negative for ear pain and sore throat.   Eyes: Negative for pain and visual disturbance.  Respiratory: Negative for shortness of breath.        Patient's been having hiccups for the past 1.5 weeks intermittently  Cardiovascular: Negative for chest pain and palpitations.  Gastrointestinal: Positive for nausea. Negative for abdominal pain and vomiting.  Genitourinary: Negative for dysuria and hematuria.  Musculoskeletal: Negative for arthralgias and back pain.  Skin: Negative for color change and rash.  Neurological: Negative for seizures, loss of consciousness and syncope.  All other systems reviewed and are negative.     Physical Exam Updated Vital Signs BP 131/89   Pulse 98   Temp 98.7 F (37.1 C) (Oral)   Resp 18   SpO2 100%   Physical Exam Vitals signs and nursing note reviewed.  Constitutional:      Appearance: He is well-developed. He is not ill-appearing.  HENT:     Head: Normocephalic and atraumatic.  Eyes:     Conjunctiva/sclera: Conjunctivae normal.  Neck:     Musculoskeletal: Neck supple. No neck rigidity.  Cardiovascular:     Rate and Rhythm: Normal rate and regular rhythm.     Heart sounds: No murmur.  Pulmonary:     Effort: Pulmonary effort is normal. No respiratory distress.     Breath sounds: Normal breath sounds.  Abdominal:     Palpations: Abdomen is soft.     Tenderness: There is no abdominal tenderness. There is no right CVA tenderness or left CVA tenderness.  Musculoskeletal:        General: No tenderness.  Skin:    General: Skin is warm and dry.  Neurological:     General: No focal deficit present.     Mental Status: He is alert and oriented to person, place, and time.     Cranial Nerves: No cranial nerve deficit.     Sensory: No sensory deficit.     Motor: No weakness.     Coordination: Coordination normal.     Gait: Gait normal.     Deep Tendon Reflexes: Reflexes normal.  Psychiatric:        Mood and Affect: Mood normal.      ED Treatments / Results  Labs (all labs ordered are listed, but only abnormal results are displayed) Labs Reviewed  COMPREHENSIVE METABOLIC PANEL - Abnormal; Notable for the following components:      Result Value   Sodium 131 (*)    Potassium 2.7 (*)    Chloride 87 (*)    Glucose, Bld 129 (*)    Total Protein 8.2 (*)    Albumin 3.1 (*)    AST 66 (*)    Total Bilirubin 2.7 (*)    Anion gap 16 (*)    All other components within normal limits  CBC - Abnormal; Notable for the following components:   RBC 4.21 (*)    Platelets 149 (*)    All other components within normal limits  URINALYSIS, ROUTINE W REFLEX MICROSCOPIC -  Abnormal; Notable for the following components:   Color, Urine AMBER (*)    Hgb urine dipstick SMALL (*)    Bilirubin Urine MODERATE (*)    Protein, ur 30 (*)    All other components within normal limits  MAGNESIUM - Abnormal; Notable for the following components:   Magnesium 1.2 (*)    All other components within normal limits  LIPASE, BLOOD    EKG EKG Interpretation  Date/Time:  Thursday June 15 2019 22:24:33 EDT Ventricular Rate:  95 PR Interval:    QRS Duration: 91 QT Interval:  374 QTC Calculation: 471 R Axis:   85 Text Interpretation:  Sinus rhythm Ventricular premature complex Right atrial enlargement Repol abnrm suggests ischemia, diffuse leads No significant change since last tracing Confirmed by Alvira MondaySchlossman, Erin ([id number]) on 06/15/2019 11:19:43 PM   Radiology Dg Chest Portable 1 View  Result Date: 06/15/2019 CLINICAL DATA:  Cough abdominal pain vomiting EXAM: PORTABLE CHEST 1 VIEW COMPARISON:  01/20/2013 FINDINGS: The heart size and mediastinal contours are within normal limits. Both lungs are clear. The visualized skeletal structures are unremarkable. IMPRESSION: No active disease. Electronically Signed   By: Jasmine PangKim  Fujinaga M.D.   On: 06/15/2019 22:52    Procedures Procedures (including critical care time)  Medications Ordered in ED Medications  sodium chloride flush (NS) 0.9 % injection 3 mL (has no administration in time range)  ondansetron (ZOFRAN-ODT) 4 MG disintegrating tablet (has no administration in time range)  potassium chloride SA (KLOR-CON) CR tablet 40 mEq (40 mEq Oral Given 06/15/19 2257)  sodium chloride 0.9 % bolus 1,000 mL (0 mLs Intravenous Stopped 06/16/19 0100)  metoCLOPramide (REGLAN) injection 10 mg (10 mg Intravenous Given 06/15/19 2255)  diphenhydrAMINE (BENADRYL) injection 25 mg (25 mg Intravenous Given 06/15/19 2255)  magnesium sulfate IVPB 2 g 50 mL (0 g Intravenous Stopped 06/16/19 0115)     Initial Impression / Assessment and Plan  / ED Course  I have reviewed the triage vital signs and the nursing notes.  Pertinent labs & imaging results that were available during my care of the patient were reviewed by me and considered in my medical decision making (see chart for details).        Patient is a 58 year old male with history of physical exam as above presents to emergency department for evaluation of hiccups and nausea for the past approximately 1.5 weeks intermittently.  Patient reports that he has had similar symptoms previously when he was withdrawing from alcohol.  Patient states that he does wish to quit drinking alcohol and states that his last drink was at approximately 1 this afternoon.  Patient is hemodynamically stable at this time.  Labs were obtained which demonstrated hypokalemia as well as hypomagnesemia.  Patient was given repletion of these in the emergency department as well as prescriptions for Reglan, potassium, Protonix, and Librium taper to assist with alcohol withdrawal.  Patient verbalized understanding of the symptomatic management.  Stated that he would contact his primary care physician today for further outpatient management.  Patient was discharged in stable condition.  I saw this patient in conjunction with the attending physician of record for this patient.  Final Clinical Impressions(s) / ED Diagnoses   Final diagnoses:  Hypokalemia  Nausea and vomiting, intractability of vomiting not specified, unspecified vomiting type  Hiccups  Alcohol dependence with uncomplicated withdrawal [facility](HCC)    ED Discharge Orders         Ordered    chlordiazePOXIDE (LIBRIUM) 25 MG capsule  Status:  Discontinued     06/16/19 0010    chlordiazePOXIDE (LIBRIUM) 25 MG capsule     06/16/19 0014    metoCLOPramide (REGLAN) 10 MG tablet  Every 6 hours     06/16/19 0112    potassium chloride SA (KLOR-CON) 20 MEQ tablet  2 times daily     06/16/19 0112    pantoprazole (PROTONIX) 20 MG tablet  Daily     06/16/19  0112           Romona Curls, MD 06/16/19 1443    Gareth Morgan, MD 06/17/19 1239

## 2019-06-27 ENCOUNTER — Other Ambulatory Visit: Payer: Self-pay

## 2019-06-27 ENCOUNTER — Ambulatory Visit (AMBULATORY_SURGERY_CENTER): Payer: Medicaid Other | Admitting: *Deleted

## 2019-06-27 VITALS — Temp 97.7°F | Ht 71.0 in | Wt 160.0 lb

## 2019-06-27 DIAGNOSIS — Z1211 Encounter for screening for malignant neoplasm of colon: Secondary | ICD-10-CM

## 2019-06-27 DIAGNOSIS — Z1159 Encounter for screening for other viral diseases: Secondary | ICD-10-CM

## 2019-06-27 MED ORDER — SUPREP BOWEL PREP KIT 17.5-3.13-1.6 GM/177ML PO SOLN: 1.0000 | Freq: Once | ORAL | 0 refills | Status: AC

## 2019-06-27 NOTE — Progress Notes (Signed)
No egg or soy allergy known to patient  No issues with past sedation with any surgeries  or procedures, no intubation problems  No diet pills per patient No home 02 use per patient  No blood thinners per patient  Pt denies issues with constipation  No A fib or A flutter  EMMI video sent to pt's e mail   Tywanda Niece in Pv with pt today - her temp 98.2   Per Niece Juanda Chance, she will be with pt to help with prep Monday and Tuesday   Pt is a fall risk- he lives at home with his girl friend- niece and pt state he can transfer self with minimal assistance   Due to the COVID-19 pandemic we are asking patients to follow these guidelines. Please only bring one care partner. Please be aware that your care partner may wait in the car in the parking lot or if they feel like they will be too hot to wait in the car, they may wait in the lobby on the 4th floor. All care partners are required to wear a mask the entire time (we do not have any that we can provide them), they need to practice social distancing, and we will do a Covid check for all patient's and care partners when you arrive. Also we will check their temperature and your temperature. If the care partner waits in their car they need to stay in the parking lot the entire time and we will call them on their cell phone when the patient is ready for discharge so they can bring the car to the front of the building. Also all patient's will need to wear a mask into building.  Covid test 11-5 at 935 am for colon 11-10 Tuesday

## 2019-06-29 LAB — SARS CORONAVIRUS 2 (TAT 6-24 HRS): SARS Coronavirus 2: NEGATIVE

## 2019-07-04 ENCOUNTER — Telehealth: Payer: Self-pay | Admitting: Gastroenterology

## 2019-07-04 ENCOUNTER — Encounter: Payer: Medicaid Other | Admitting: Gastroenterology

## 2019-07-04 NOTE — Telephone Encounter (Signed)
Forwarded this note to Dr. Kris Mouton per Dr. Woodward Ku order.

## 2019-07-04 NOTE — Telephone Encounter (Signed)
Late cancellation, ok to charge.  Unfortunately we cannot proceed with exam without complete bowel prep. Please let the referring provider so they can discuss with patient. Thanks

## 2019-11-09 ENCOUNTER — Ambulatory Visit: Payer: Medicaid Other | Attending: Family

## 2019-11-09 DIAGNOSIS — Z23 Encounter for immunization: Secondary | ICD-10-CM

## 2019-11-14 NOTE — Progress Notes (Signed)
   Covid-19 Vaccination Clinic  Name:  Todd Tran    MRN: [id number] DOB: 1961/08/13  11/14/2019  [name] was observed post Covid-19 immunization for 15 minutes without incident. He was provided with Vaccine Information Sheet and instruction to access the V-Safe system.   [name] was instructed to call 911 with any severe reactions post vaccine: Marland Kitchen Difficulty breathing  . Swelling of face and throat  . A fast heartbeat  . A bad rash all over body  . Dizziness and weakness   Immunizations Administered    Name Date Dose VIS Date Route   Moderna COVID-19 Vaccine 11/09/2019  5:00 PM 0.5 mL 07/25/2019 Intramuscular   Manufacturer: Moderna   Lot: [id number]   NDC: 16384-536-46

## 2019-12-12 ENCOUNTER — Ambulatory Visit: Payer: Self-pay

## 2019-12-12 ENCOUNTER — Ambulatory Visit: Payer: Medicaid Other | Attending: Family

## 2019-12-12 DIAGNOSIS — Z23 Encounter for immunization: Secondary | ICD-10-CM

## 2019-12-14 ENCOUNTER — Other Ambulatory Visit: Payer: Self-pay

## 2019-12-14 NOTE — Progress Notes (Signed)
   Covid-19 Vaccination Clinic  Name:  Todd Tran    MRN: [id number] DOB: June 12, 1961  12/14/2019  Todd Tran was observed post Covid-19 immunization for 15 minutes without incident. He was provided with Vaccine Information Sheet and instruction to access the V-Safe system.   Todd Tran was instructed to call 911 with any severe reactions post vaccine: Marland Kitchen Difficulty breathing  . Swelling of face and throat  . A fast heartbeat  . A bad rash all over body  . Dizziness and weakness   Immunizations Administered    Name Date Dose VIS Date Route   Moderna COVID-19 Vaccine 12/12/2019 12:45 PM 0.5 mL 07/2019 Intramuscular   Manufacturer: Moderna   Lot: [id number]   NDC: 33007-622-63

## 2019-12-29 ENCOUNTER — Ambulatory Visit (HOSPITAL_COMMUNITY)
Admission: RE | Admit: 2019-12-29 | Discharge: 2019-12-29 | Disposition: A | Discharge status: Home / Self Care | Payer: Medicaid Other | Source: Ambulatory Visit | Attending: Family Medicine | Admitting: Family Medicine

## 2019-12-29 ENCOUNTER — Ambulatory Visit (INDEPENDENT_AMBULATORY_CARE_PROVIDER_SITE_OTHER): Payer: Medicaid Other | Admitting: Student in an Organized Health Care Education/Training Program

## 2019-12-29 ENCOUNTER — Other Ambulatory Visit: Payer: Self-pay

## 2019-12-29 VITALS — BP 130/70 | HR 97 | Ht 71.0 in | Wt 152.4 lb

## 2019-12-29 DIAGNOSIS — M7989 Other specified soft tissue disorders: Secondary | ICD-10-CM | POA: Diagnosis present

## 2019-12-29 DIAGNOSIS — K4091 Unilateral inguinal hernia, without obstruction or gangrene, recurrent: Secondary | ICD-10-CM | POA: Diagnosis not present

## 2019-12-29 DIAGNOSIS — F1020 Alcohol dependence, uncomplicated: Secondary | ICD-10-CM | POA: Diagnosis not present

## 2019-12-29 DIAGNOSIS — R600 Localized edema: Secondary | ICD-10-CM | POA: Insufficient documentation

## 2019-12-29 DIAGNOSIS — K4090 Unilateral inguinal hernia, without obstruction or gangrene, not specified as recurrent: Secondary | ICD-10-CM | POA: Diagnosis not present

## 2019-12-29 NOTE — Progress Notes (Signed)
Patient given PHQ2 and 9.   Provider aware.  .Joy Reiger R Dorothee Napierkowski, CMA  

## 2019-12-29 NOTE — Assessment & Plan Note (Signed)
Referred to gen surg as he has increasing symptoms. Will need to stabilize current condition first unless becomes incarcerated.

## 2019-12-29 NOTE — Patient Instructions (Signed)
It was a pleasure to see you today!  To summarize our discussion for this visit:  There are several causes for leg swelling. I am concerned that this could be caused by kidney or liver disease in you so we will get some blood work today to get more information. Your EKG did not show signs of a cardiac cause but did suggest that there may be some electrolyte imbalances which we will see on the blood work. Please expect a call over the weekend if anything is dangerously abnormal. Otherwise, I will let you know next week.  I have sent a referral to general surgery for the hernia but they will likely not treat until other medical conditions have been stabilized.   Some additional health maintenance measures we should update are: Health Maintenance Due  Topic Date Due  . COLONOSCOPY  Never done  .    Please return to our clinic to see [place] next week.  Call the clinic at [phone] if your symptoms worsen or you have any concerns.   Thank you for allowing me to take part in your care,  Dr. Jamelle Rushing

## 2019-12-29 NOTE — Assessment & Plan Note (Signed)
counseled

## 2019-12-29 NOTE — Assessment & Plan Note (Addendum)
Broad differential including venous insufficiency however, high suspicion for liver/kidney pathology.  Will evaluate for cardiac involvement as well. EKG negative for ST changes but signs of possible hypokalemia and low voltage with prolonged QTc.  - CMP, CBC, PT/INR, hepatitis panel, bilirubin, B12, folate, magnesium today If labs are suspicious for liver as main contributor to symptoms, would recommend RUQ [place].  Told patient and girlfriend

## 2019-12-29 NOTE — Progress Notes (Signed)
    SUBJECTIVE:   CHIEF COMPLAINT / HPI: LE swelling, hernia  Hernia-chronic and worsening.  States that for the past week it has been protruding and appearing swollen more than usual.  Denies any pain at the site or in the abdomen.  Continues to have normal bowel movements daily including one today which was of normal consistency and without blood.  Has had associated nausea and vomiting daily since worsening of symptoms.  This has decreased his appetite.  Denies any fevers.  Lower extremity edema-bilateral acute onset edema started approximately a week ago.  Right side slightly worse than left.  Swelling is slightly painful but does not restrict ambulation.  Has not started any new medications.  Denies any known history of heart disease, liver disease, kidney disease.  Endorses drinking approximately a 40 ounce beer per day as well as at least 2 hard alcohol drinks per day.  Denies orthopnea, shortness of breath, chest pain, exertional dyspnea  Alcohol use disorder- About 40oz beer per day as well as 2 hard alcohol drinks per day.  Drinks when wakes up in the am.  Has withdrawal tremors frequently when not drinking  PERTINENT  PMH / PSH: Alcohol use disorder, hypokalemia  OBJECTIVE:   BP 130/70   Pulse 97   Ht 5\' 11"  (1.803 m)   Wt 152 lb 6.4 oz (69.1 kg)   SpO2 99%   BMI 21.26 kg/m   General: NAD, able to participate in exam HEENT: scleral icterus. Negative JVD.  Cardiac: RRR, normal heart sounds, no murmurs. 2+ radial and PT pulses bilaterally Respiratory: CTAB, normal effort, No wheezes, rales or rhonchi Abdomen: soft, nontender, non distended, liver edge palpable at costal margin. - right reducible inguinal hernia Extremities: bilateral lower extremity edema to mid shin. 2+ pitting on right, 3+ on left. WWP. Unsteady gait.  Skin: warm and dry, no rashes noted Neuro: alert and oriented x4, no focal deficits. Follows simple commands, struggles with more complex questions or  instructions Psych: flat affect and mood.   ASSESSMENT/PLAN:   Bilateral lower extremity edema Broad differential including venous insufficiency however, high suspicion for liver/kidney pathology.  Will evaluate for cardiac involvement as well. EKG negative for ST changes but signs of possible hypokalemia and low voltage with prolonged QTc.  - CMP, CBC, PT/INR, hepatitis panel, bilirubin, B12, folate, magnesium today If labs are suspicious for liver as main contributor to symptoms, would recommend RUQ .   Alcohol use disorder (HCC) counseled  Right inguinal hernia Referred to gen surg as he has increasing symptoms. Will need to stabilize current condition first unless becomes incarcerated.      [place], DO [facility]

## 2019-12-30 LAB — HEPATITIS PANEL, ACUTE
Hep A IgM: NEGATIVE
Hep B C IgM: NEGATIVE
Hep C Virus Ab: <0.1 s/co ratio (ref 0.0–0.9)
Hepatitis B Surface Ag: NEGATIVE

## 2019-12-30 LAB — COMPREHENSIVE METABOLIC PANEL
ALT: 27 IU/L (ref 0–44)
AST: 79 IU/L — ABNORMAL HIGH (ref 0–40)
Albumin/Globulin Ratio: 0.6 — ABNORMAL LOW (ref 1.2–2.2)
Albumin: 2.6 g/dL — ABNORMAL LOW (ref 3.8–4.9)
Alkaline Phosphatase: 150 IU/L — ABNORMAL HIGH (ref 39–117)
BUN/Creatinine Ratio: 10 (ref 9–20)
BUN: 9 mg/dL (ref 6–24)
Bilirubin Total: 9.7 mg/dL — ABNORMAL HIGH (ref 0.0–1.2)
CO2: 30 mmol/L — ABNORMAL HIGH (ref 20–29)
Calcium: 8.5 mg/dL — ABNORMAL LOW (ref 8.7–10.2)
Chloride: 80 mmol/L — ABNORMAL LOW (ref 96–106)
Creatinine, Ser: 0.9 mg/dL (ref 0.76–1.27)
GFR calc Af Amer: 108 mL/min/{1.73_m2} (ref >59)
GFR calc non Af Amer: 94 mL/min/{1.73_m2} (ref >59)
Globulin, Total: 4.1 g/dL (ref 1.5–4.5)
Glucose: 89 mg/dL (ref 65–99)
Potassium: 2.7 mmol/L — ABNORMAL LOW (ref 3.5–5.2)
Sodium: 125 mmol/L — ABNORMAL LOW (ref 134–144)
Total Protein: 6.7 g/dL (ref 6.0–8.5)

## 2019-12-30 LAB — FOLATE: Folate: 2.4 ng/mL — ABNORMAL LOW (ref >3.0)

## 2019-12-30 LAB — PROTIME-INR
INR: 1.6 — ABNORMAL HIGH (ref 0.9–1.2)
Prothrombin Time: 17.1 s — ABNORMAL HIGH (ref 9.1–12.0)

## 2019-12-30 LAB — CBC
Hematocrit: 32 % — ABNORMAL LOW (ref 37.5–51.0)
Hemoglobin: 11.9 g/dL — ABNORMAL LOW (ref 13.0–17.7)
MCH: 33.1 pg — ABNORMAL HIGH (ref 26.6–33.0)
MCHC: 37.2 g/dL — ABNORMAL HIGH (ref 31.5–35.7)
MCV: 89 fL (ref 79–97)
Platelets: 196 10*3/uL (ref 150–450)
RBC: 3.6 x10E6/uL — ABNORMAL LOW (ref 4.14–5.80)
RDW: 13.2 % (ref 11.6–15.4)
WBC: 10.9 10*3/uL — ABNORMAL HIGH (ref 3.4–10.8)

## 2019-12-30 LAB — BILIRUBIN, FRACTIONATED(TOT/DIR/INDIR)
Bilirubin, Direct: 6.22 mg/dL — ABNORMAL HIGH (ref 0.00–0.40)
Bilirubin, Indirect: 3.48 mg/dL (ref 0.10–0.80)

## 2019-12-30 LAB — VITAMIN B12: Vitamin B-12: 1531 pg/mL — ABNORMAL HIGH (ref 232–1245)

## 2019-12-30 LAB — MAGNESIUM: Magnesium: 1.3 mg/dL — ABNORMAL LOW (ref 1.6–2.3)

## 2020-01-01 ENCOUNTER — Telehealth: Payer: Self-pay

## 2020-01-01 NOTE — Telephone Encounter (Signed)
Patient's significant other, Lupita Leash, calls nurse line requesting lab results from visit on Friday, 12/29/19. Unable to find letter or result note. Forwarding to provider that evaluated patient on Friday.   To Dr. Elita Quick, RN

## 2020-01-03 ENCOUNTER — Other Ambulatory Visit: Payer: Self-pay | Admitting: Student in an Organized Health Care Education/Training Program

## 2020-01-03 DIAGNOSIS — F1020 Alcohol dependence, uncomplicated: Secondary | ICD-10-CM

## 2020-01-03 MED ORDER — MAGNESIUM GLUCONATE 30 MG PO TABS: 30.0000 mg | ORAL_TABLET | Freq: Two times a day (BID) | ORAL | 0 refills | Status: DC

## 2020-01-03 MED ORDER — FOLIC ACID 1 MG PO TABS: 1.0000 mg | ORAL_TABLET | Freq: Every day | ORAL | 0 refills | Status: DC

## 2020-01-03 MED ORDER — THIAMINE HCL 100 MG PO TABS: 100.0000 mg | ORAL_TABLET | Freq: Every day | ORAL | 0 refills | Status: DC

## 2020-01-03 MED ORDER — POTASSIUM CHLORIDE ER 20 MEQ PO TBCR: 40.0000 meq | EXTENDED_RELEASE_TABLET | Freq: Every day | ORAL | 0 refills | Status: DC

## 2020-01-03 NOTE — Progress Notes (Signed)
Attempted to call patient multiple times and left voicemail in regard to abnormal labs. Got ahold of him today and he states he feels that he has a cold. When I asked him to describe more, he asked me if I was his dentist. I again introduced myself as Dr Dareen Piano from [facility] family medicine and that I was calling to discuss his lab results. He then passed the phone to his significant other to discuss his labs. Provided her with many lab abnormalities and my concerns for his health at this time. She said she knew it would be something like this. Wanted them to consider presenting to ED but they were satisfied with appointment this Friday for follow up.  Based on his history and labs, concerned for cirrhosis. Hepatitis screening negative.  - ordered RUQ [place] stat.  - referral to GI - prescribed K+, thiamine - prescribed PO Mg++ which may help with elevated Bili as well. - discussed cutting back on alcohol consumption but not stopping abruptly.  - follow up on Friday with precautions to go to ED for AMS.  - would recommend repeat labs at f/u appointment and consider hospital admission.

## 2020-01-04 ENCOUNTER — Telehealth: Payer: Self-pay

## 2020-01-04 NOTE — Telephone Encounter (Signed)
Called x 3 to get in touch with patient concerning Ultrasound. All numbers in chart were not answered.  Patient needs to be NPO for 6 to 8 hours prior to exam.  Have not been able to get in touch with patient in order to see if he has eaten.  Ultrasound has been pre authorized by [employer] [facility] [id number].  Patient has appointment 01/05/2020 with Dr. Linwood Dibbles at 1330.  If patient has eaten, the ultrasound can be be scheduled for early 01/08/2020.  Glennie Hawk, CMA

## 2020-01-05 ENCOUNTER — Ambulatory Visit (HOSPITAL_COMMUNITY)
Admission: RE | Admit: 2020-01-05 | Discharge: 2020-01-05 | Disposition: A | Discharge status: Home / Self Care | Payer: Medicaid Other | Source: Ambulatory Visit | Attending: Family Medicine | Admitting: Family Medicine

## 2020-01-05 ENCOUNTER — Ambulatory Visit (INDEPENDENT_AMBULATORY_CARE_PROVIDER_SITE_OTHER): Payer: Medicaid Other | Admitting: Family Medicine

## 2020-01-05 ENCOUNTER — Other Ambulatory Visit: Payer: Self-pay

## 2020-01-05 ENCOUNTER — Encounter: Payer: Self-pay | Admitting: Family Medicine

## 2020-01-05 VITALS — BP 132/70 | HR 99 | Ht 71.0 in

## 2020-01-05 DIAGNOSIS — K7031 Alcoholic cirrhosis of liver with ascites: Secondary | ICD-10-CM

## 2020-01-05 DIAGNOSIS — F1020 Alcohol dependence, uncomplicated: Secondary | ICD-10-CM

## 2020-01-05 DIAGNOSIS — K7460 Unspecified cirrhosis of liver: Secondary | ICD-10-CM | POA: Insufficient documentation

## 2020-01-05 MED ORDER — LACTULOSE 20 GM/30ML PO SOLN: 10.0000 mL | Freq: Three times a day (TID) | ORAL | 1 refills | Status: DC

## 2020-01-05 MED ORDER — FUROSEMIDE 20 MG PO TABS: 20.0000 mg | ORAL_TABLET | Freq: Every day | ORAL | 0 refills | Status: DC

## 2020-01-05 MED ORDER — SPIRONOLACTONE 50 MG PO TABS: 50.0000 mg | ORAL_TABLET | Freq: Every day | ORAL | 0 refills | Status: DC

## 2020-01-05 NOTE — Progress Notes (Signed)
    SUBJECTIVE:   CHIEF COMPLAINT / HPI: f/u  Seen 5/7 for bilateral acute lower extremity edema x1 week.  EKG only notable for slightly low voltage with prolonged QTC at that time.  Labs concerning for liver failure with negative hep panel.  Low folate, hemoglobin, mag, potassium likely related to extensive alcohol use.  RUQ [place] ordered but not yet obtained.  Presents with girlfriend today who provides history. Reports progressively declining functional status with increasing confusion over the past month. States swelling is a little better. Still with no appetite but girlfriend tries to get him to eat. Yesterday he only ate a banana and drank some ensure. Hasn't eaten today. He sleeps a lot. He has been vomiting after eating, this has been ongoing for the past month. Denies seizures, last seizure 2 years ago related to alcohol withdrawal. Last drink 1 week ago. He drinks mostly wine, ~1L daily and occasional 40oz beer. Occasionally will have tremors. Denies swelling anywhere else, no SOB, chest pain. Thinks his abdomen is slightly enlarged. Normally has BM daily.  PERTINENT  PMH / PSH: Peripheral neuropathy, alcohol use disorder, seizures  OBJECTIVE:   BP 132/70   Pulse 99   Ht 5\' 11"  (1.803 m)   SpO2 100%   BMI 21.26 kg/m  Gen: gaunt, chronically ill appearing, in NAD HEENT: scleral icterus, palatal jaundice Cardiac: RRR, no murmur, no LE edema Resp: CTAB, no rales/wheezes, normal WOB on RA Abdomen: slightly distended abdomen, unable to palpate liver edge. NonTTP. Minimal BS. Neuro: asterixis present. Oriented to person, year, president (said " " for place, did not know city).  Bedside [facility] with moderate abdominal ascites present.  ASSESSMENT/PLAN:   Cirrhosis of liver (HCC) Likely alcoholic etiology given history and elevated AST. Minimally interactive on today's exam with asterixis, suspect ongoing hepatic encephalopathy present though oriented and maintaining vitals. Will  obtain RUQ [place] to confirm cirrhosis and more fully assess presence of ascites (appreciable on exam and bedside US). MELD-Na score 28 with Child Pugh class C. Will repeat CMP, CBC, mag today as well as obtain ammonia level. Start lactulose and spiro/lasix. Refer to GI for evaluation of varices. Prognosis of disease discussed extensively with girlfriend today, referral placed for outpatient palliative care. F/u in one week.   [place], DO [facility]

## 2020-01-05 NOTE — Assessment & Plan Note (Signed)
Likely alcoholic etiology given history and elevated AST. Minimally interactive on today's exam with asterixis, suspect ongoing hepatic encephalopathy present though oriented and maintaining vitals. Will obtain RUQ [place] to confirm cirrhosis and more fully assess presence of ascites (appreciable on exam and bedside US). MELD-Na score 28 with Child Pugh class C. Will repeat CMP, CBC, mag today as well as obtain ammonia level. Start lactulose and spiro/lasix. Refer to GI for evaluation of varices. Prognosis of disease discussed extensively with girlfriend today, referral placed for outpatient palliative care. F/u in one week.

## 2020-01-06 ENCOUNTER — Inpatient Hospital Stay (HOSPITAL_COMMUNITY)
Admission: EM | Admit: 2020-01-06 | Discharge: 2020-01-10 | DRG: 871 | Disposition: A | Discharge status: Hospice | Payer: Medicaid Other | Attending: Family Medicine | Admitting: Family Medicine

## 2020-01-06 ENCOUNTER — Emergency Department (HOSPITAL_COMMUNITY): Payer: Medicaid Other

## 2020-01-06 ENCOUNTER — Other Ambulatory Visit: Payer: Self-pay

## 2020-01-06 DIAGNOSIS — R4182 Altered mental status, unspecified: Secondary | ICD-10-CM

## 2020-01-06 DIAGNOSIS — G92 Toxic encephalopathy: Secondary | ICD-10-CM | POA: Diagnosis present

## 2020-01-06 DIAGNOSIS — N179 Acute kidney failure, unspecified: Secondary | ICD-10-CM | POA: Diagnosis present

## 2020-01-06 DIAGNOSIS — R400 Somnolence: Secondary | ICD-10-CM | POA: Diagnosis not present

## 2020-01-06 DIAGNOSIS — G629 Polyneuropathy, unspecified: Secondary | ICD-10-CM | POA: Diagnosis present

## 2020-01-06 DIAGNOSIS — R569 Unspecified convulsions: Secondary | ICD-10-CM | POA: Diagnosis present

## 2020-01-06 DIAGNOSIS — K219 Gastro-esophageal reflux disease without esophagitis: Secondary | ICD-10-CM | POA: Diagnosis present

## 2020-01-06 DIAGNOSIS — R17 Unspecified jaundice: Secondary | ICD-10-CM | POA: Diagnosis not present

## 2020-01-06 DIAGNOSIS — F17210 Nicotine dependence, cigarettes, uncomplicated: Secondary | ICD-10-CM | POA: Diagnosis present

## 2020-01-06 DIAGNOSIS — Z66 Do not resuscitate: Secondary | ICD-10-CM | POA: Diagnosis present

## 2020-01-06 DIAGNOSIS — K7031 Alcoholic cirrhosis of liver with ascites: Secondary | ICD-10-CM | POA: Diagnosis present

## 2020-01-06 DIAGNOSIS — R0682 Tachypnea, not elsewhere classified: Secondary | ICD-10-CM | POA: Diagnosis present

## 2020-01-06 DIAGNOSIS — F10239 Alcohol dependence with withdrawal, unspecified: Secondary | ICD-10-CM | POA: Diagnosis present

## 2020-01-06 DIAGNOSIS — I10 Essential (primary) hypertension: Secondary | ICD-10-CM | POA: Diagnosis present

## 2020-01-06 DIAGNOSIS — K7040 Alcoholic hepatic failure without coma: Secondary | ICD-10-CM | POA: Diagnosis present

## 2020-01-06 DIAGNOSIS — K7030 Alcoholic cirrhosis of liver without ascites: Secondary | ICD-10-CM | POA: Diagnosis not present

## 2020-01-06 DIAGNOSIS — Z20822 Contact with and (suspected) exposure to covid-19: Secondary | ICD-10-CM | POA: Diagnosis present

## 2020-01-06 DIAGNOSIS — K8020 Calculus of gallbladder without cholecystitis without obstruction: Secondary | ICD-10-CM | POA: Diagnosis present

## 2020-01-06 DIAGNOSIS — K7200 Acute and subacute hepatic failure without coma: Secondary | ICD-10-CM | POA: Diagnosis not present

## 2020-01-06 DIAGNOSIS — M6250 Muscle wasting and atrophy, not elsewhere classified, unspecified site: Secondary | ICD-10-CM | POA: Diagnosis present

## 2020-01-06 DIAGNOSIS — E871 Hypo-osmolality and hyponatremia: Secondary | ICD-10-CM | POA: Diagnosis present

## 2020-01-06 DIAGNOSIS — A419 Sepsis, unspecified organism: Secondary | ICD-10-CM | POA: Diagnosis present

## 2020-01-06 DIAGNOSIS — R140 Abdominal distension (gaseous): Secondary | ICD-10-CM | POA: Diagnosis present

## 2020-01-06 DIAGNOSIS — K766 Portal hypertension: Secondary | ICD-10-CM | POA: Diagnosis present

## 2020-01-06 DIAGNOSIS — E872 Acidosis: Secondary | ICD-10-CM | POA: Diagnosis present

## 2020-01-06 DIAGNOSIS — Z833 Family history of diabetes mellitus: Secondary | ICD-10-CM | POA: Diagnosis not present

## 2020-01-06 DIAGNOSIS — R634 Abnormal weight loss: Secondary | ICD-10-CM | POA: Diagnosis present

## 2020-01-06 DIAGNOSIS — R066 Hiccough: Secondary | ICD-10-CM | POA: Diagnosis not present

## 2020-01-06 DIAGNOSIS — I959 Hypotension, unspecified: Secondary | ICD-10-CM | POA: Diagnosis present

## 2020-01-06 DIAGNOSIS — K7290 Hepatic failure, unspecified without coma: Secondary | ICD-10-CM | POA: Diagnosis not present

## 2020-01-06 DIAGNOSIS — Z515 Encounter for palliative care: Secondary | ICD-10-CM | POA: Diagnosis not present

## 2020-01-06 DIAGNOSIS — K7682 Hepatic encephalopathy: Secondary | ICD-10-CM | POA: Diagnosis present

## 2020-01-06 DIAGNOSIS — R6520 Severe sepsis without septic shock: Secondary | ICD-10-CM | POA: Diagnosis present

## 2020-01-06 DIAGNOSIS — K7460 Unspecified cirrhosis of liver: Secondary | ICD-10-CM | POA: Diagnosis present

## 2020-01-06 DIAGNOSIS — Z79899 Other long term (current) drug therapy: Secondary | ICD-10-CM

## 2020-01-06 DIAGNOSIS — F419 Anxiety disorder, unspecified: Secondary | ICD-10-CM | POA: Diagnosis present

## 2020-01-06 LAB — COMPREHENSIVE METABOLIC PANEL
ALT: 36 U/L (ref 0–44)
AST: 86 U/L — ABNORMAL HIGH (ref 15–41)
Albumin: 1.9 g/dL — ABNORMAL LOW (ref 3.5–5.0)
Alkaline Phosphatase: 86 U/L (ref 38–126)
Anion gap: 18 — ABNORMAL HIGH (ref 5–15)
BUN: 57 mg/dL — ABNORMAL HIGH (ref 6–20)
CO2: 31 mmol/L (ref 22–32)
Calcium: 8.7 mg/dL — ABNORMAL LOW (ref 8.9–10.3)
Chloride: 76 mmol/L — ABNORMAL LOW (ref 98–111)
Creatinine, Ser: 4.51 mg/dL — ABNORMAL HIGH (ref 0.61–1.24)
GFR calc Af Amer: 15 mL/min — ABNORMAL LOW (ref >60)
GFR calc non Af Amer: 13 mL/min — ABNORMAL LOW (ref >60)
Glucose, Bld: 121 mg/dL — ABNORMAL HIGH (ref 70–99)
Potassium: 3.7 mmol/L (ref 3.5–5.1)
Sodium: 125 mmol/L — ABNORMAL LOW (ref 135–145)
Total Bilirubin: 14.5 mg/dL — ABNORMAL HIGH (ref 0.3–1.2)
Total Protein: 6.9 g/dL (ref 6.5–8.1)

## 2020-01-06 LAB — BRAIN NATRIURETIC PEPTIDE: B Natriuretic Peptide: 55.6 pg/mL (ref 0.0–100.0)

## 2020-01-06 LAB — CBC WITH DIFFERENTIAL/PLATELET
Abs Immature Granulocytes: 0.13 10*3/uL — ABNORMAL HIGH (ref 0.00–0.07)
Basophils Absolute: 0 10*3/uL (ref 0.0–0.1)
Basophils Relative: 0 %
Eosinophils Absolute: 0.1 10*3/uL (ref 0.0–0.5)
Eosinophils Relative: 0 %
HCT: 30.8 % — ABNORMAL LOW (ref 39.0–52.0)
Hemoglobin: 10.8 g/dL — ABNORMAL LOW (ref 13.0–17.0)
Immature Granulocytes: 1 %
Lymphocytes Relative: 6 %
Lymphs Abs: 1.1 10*3/uL (ref 0.7–4.0)
MCH: 33.2 pg (ref 26.0–34.0)
MCHC: 35.1 g/dL (ref 30.0–36.0)
MCV: 94.8 fL (ref 80.0–100.0)
Monocytes Absolute: 1.9 10*3/uL — ABNORMAL HIGH (ref 0.1–1.0)
Monocytes Relative: 9 %
Neutro Abs: 17 10*3/uL — ABNORMAL HIGH (ref 1.7–7.7)
Neutrophils Relative %: 84 %
Platelets: 181 10*3/uL (ref 150–400)
RBC: 3.25 MIL/uL — ABNORMAL LOW (ref 4.22–5.81)
RDW: 17.2 % — ABNORMAL HIGH (ref 11.5–15.5)
WBC: 20.2 10*3/uL — ABNORMAL HIGH (ref 4.0–10.5)
nRBC: 0 % (ref 0.0–0.2)

## 2020-01-06 LAB — PROTIME-INR
INR: 2.3 — ABNORMAL HIGH (ref 0.8–1.2)
Prothrombin Time: 24.1 seconds — ABNORMAL HIGH (ref 11.4–15.2)

## 2020-01-06 LAB — HEPATITIS PANEL, ACUTE
HCV Ab: NONREACTIVE
Hep A IgM: NONREACTIVE
Hep B C IgM: NONREACTIVE
Hepatitis B Surface Ag: NONREACTIVE

## 2020-01-06 LAB — LIPASE, BLOOD: Lipase: 57 U/L — ABNORMAL HIGH (ref 11–51)

## 2020-01-06 LAB — APTT: aPTT: 41 seconds — ABNORMAL HIGH (ref 24–36)

## 2020-01-06 LAB — MAGNESIUM: Magnesium: 2.1 mg/dL (ref 1.7–2.4)

## 2020-01-06 LAB — SARS CORONAVIRUS 2 BY RT PCR (HOSPITAL ORDER, PERFORMED IN ~~LOC~~ HOSPITAL LAB): SARS Coronavirus 2: NEGATIVE

## 2020-01-06 LAB — CBG MONITORING, ED: Glucose-Capillary: 108 mg/dL — ABNORMAL HIGH (ref 70–99)

## 2020-01-06 LAB — LACTIC ACID, PLASMA: Lactic Acid, Venous: 5.4 mmol/L (ref 0.5–1.9)

## 2020-01-06 LAB — AMMONIA: Ammonia: 117 umol/L — ABNORMAL HIGH (ref 9–35)

## 2020-01-06 MED ORDER — LORAZEPAM 2 MG/ML PO CONC: 1.0000 mg | ORAL | Status: DC | PRN

## 2020-01-06 MED ORDER — VANCOMYCIN HCL IN DEXTROSE 1-5 GM/200ML-% IV SOLN: 1000.0000 mg | Freq: Once | INTRAVENOUS | Status: DC

## 2020-01-06 MED ORDER — LORAZEPAM 1 MG PO TABS: 1.0000 mg | ORAL_TABLET | ORAL | Status: DC | PRN

## 2020-01-06 MED ORDER — SODIUM CHLORIDE 0.9 % IV SOLN
12.5000 mg | Freq: Four times a day (QID) | INTRAVENOUS | Status: DC | PRN
  Administered 2020-01-09: 12.5 mg via INTRAVENOUS
  Filled 2020-01-06 (×3): qty 0.5

## 2020-01-06 MED ORDER — LORAZEPAM 2 MG/ML IJ SOLN
1.0000 mg | INTRAMUSCULAR | Status: DC | PRN
  Administered 2020-01-07: 1 mg via INTRAVENOUS
  Filled 2020-01-06: qty 1

## 2020-01-06 MED ORDER — VANCOMYCIN VARIABLE DOSE PER UNSTABLE RENAL FUNCTION (PHARMACIST DOSING): Status: DC

## 2020-01-06 MED ORDER — SODIUM CHLORIDE 0.9 % IV BOLUS
30.0000 mL/kg | Freq: Once | INTRAVENOUS | Status: AC
  Administered 2020-01-06: 2073 mL via INTRAVENOUS

## 2020-01-06 MED ORDER — HYDROMORPHONE HCL 1 MG/ML IJ SOLN
0.5000 mg | INTRAMUSCULAR | Status: DC | PRN
  Administered 2020-01-06 – 2020-01-07 (×3): 1 mg via INTRAVENOUS
  Filled 2020-01-06 (×3): qty 1

## 2020-01-06 MED ORDER — SODIUM CHLORIDE 0.9 % IV SOLN
2.0000 g | Freq: Once | INTRAVENOUS | Status: AC
  Administered 2020-01-06: 2 g via INTRAVENOUS
  Filled 2020-01-06: qty 2

## 2020-01-06 MED ORDER — MORPHINE SULFATE (CONCENTRATE) 10 MG/0.5ML PO SOLN
5.0000 mg | ORAL | Status: DC | PRN
  Administered 2020-01-09: 5 mg via ORAL
  Filled 2020-01-06: qty 0.5

## 2020-01-06 MED ORDER — MORPHINE SULFATE (CONCENTRATE) 10 MG/0.5ML PO SOLN: 5.0000 mg | ORAL | Status: DC | PRN

## 2020-01-06 MED ORDER — VANCOMYCIN HCL 1250 MG/250ML IV SOLN
1250.0000 mg | Freq: Once | INTRAVENOUS | Status: AC
  Administered 2020-01-06: 1250 mg via INTRAVENOUS
  Filled 2020-01-06: qty 250

## 2020-01-06 MED ORDER — METRONIDAZOLE IN NACL 5-0.79 MG/ML-% IV SOLN
500.0000 mg | Freq: Once | INTRAVENOUS | Status: AC
  Administered 2020-01-06: 500 mg via INTRAVENOUS
  Filled 2020-01-06: qty 100

## 2020-01-06 MED ORDER — SODIUM CHLORIDE 0.9 % IV SOLN
2.0000 g | INTRAVENOUS | Status: DC
  Filled 2020-01-06: qty 2

## 2020-01-06 NOTE — Progress Notes (Signed)
Notified bedside nurse of need to draw repeat lactic acid. 

## 2020-01-06 NOTE — Progress Notes (Signed)
Comfort care, sepsis monitoring stopped

## 2020-01-06 NOTE — Progress Notes (Signed)
Pharmacy Antibiotic Note  Todd Tran is a 59 y.o. male admitted on 01/06/2020 with sepsis from unknown source.  Pharmacy has been consulted for Cefepime and vancomycin dosing. WBC elevated. Hypothermic. LA 5.4. SCr elevated at 4.5 (BL ~ 0.9).   Plan: -Cefepime 2 gm IV Q 24 hours -Vancomycin 1250 mg IV once, then dose per levels until renal fx improves  -Monitor CBC, renal fx, cultures and clinical progress -VT as indicated      Temp (24hrs), Avg:97.4 F (36.3 C), Min:97.4 F (36.3 C), Max:97.4 F (36.3 C)  Recent Labs  Lab 01/06/20 1019  WBC 20.2*  CREATININE 4.51*    Estimated Creatinine Clearance: 17.4 mL/min (A) (by C-G formula based on SCr of 4.51 mg/dL (H)).    No Known Allergies  Antimicrobials this admission: Vanc 5/15 >>  Cefepime 5/15 >>   Dose adjustments this admission:  Microbiology results: 5/15 BCx:  5/15 UCx:     Thank you for allowing pharmacy to be a part of this patient's care.  Vinnie Level, PharmD., BCPS, BCCCP Clinical Pharmacist Clinical phone for 01/06/20 until 10pm: [phone] If after 10pm, please refer to [facility] for unit-specific pharmacist

## 2020-01-06 NOTE — Progress Notes (Signed)
Notified bedside nurse of need to administer fluid bolus, pt needs 2073 cc fluid.

## 2020-01-06 NOTE — ED Triage Notes (Addendum)
Pt here from home for AMS since yesterday per family. Seen at [facility] yesterday and discharged home. Hx liver cirrhosis, eyes yellow. Unable to follow commands, pt groaning. Normally A/O x4 and ambulatory per EMS. Urinary incontinence. Nausea with one episode of vomiting prior to EMS arrival.

## 2020-01-06 NOTE — Progress Notes (Addendum)
CALL PAGER [phone] for any questions or notifications regarding this patient  FMTS Attending Note: Todd Levy MD I spoke with Todd Tran extended family in the ED. Members included his son, his brother, his 2 sisters and 2 nieces. We discussed his current state and prognosis. By family consensus they decided DNR status was appropriate. We will treat him with IVF, manage electrolytes, ammonia possible managed by lactulose (famliy is very concerned about ammonia level) etc, manage pain. His family understands his extremely poor prognosis, liver and kidney failure / end stage. They remain appropriately hopeful.

## 2020-01-06 NOTE — Consult Note (Signed)
NAME:  Todd Tran, MRN:  [id number], DOB:  May 15, 1961, LOS: 0 ADMISSION DATE:  01/06/2020, CONSULTATION DATE: 01/06/2020 REFERRING MD: Dr. Sherry Ruffing, CHIEF COMPLAINT: Altered mental status  Brief History   This is a 59 year old gentleman past medical history of significant alcohol abuse.  Over the past several months has had significant decline at home.  Was found altered this morning and brought in by family.  Patient was found to have decompensated alcoholic cirrhosis, encephalopathy, hepatic encephalopathy ammonia 117 and acute renal failure creatinine of 4.5.  History of present illness   This is a 59 year old gentleman with a longstanding history of alcohol abuse, history of alcohol-related withdrawal seizures, reflux and hypertension.  He also suffers from peripheral neuropathy related to this.  Patient has recently had ultrasound imaging of the abdomen which revealed evidence of portal hypertension and advanced cirrhosis.  He presents to the emergency department today with altered mental status and confusion.  He also has labored breathing at this point.  He was found to have acute renal failure and acidosis.  Serum creatinine of 4.5.  Lactic acidosis of 5.4.  Found to have sodium of 125 and an ammonia of 117.  I met with the entire patient's family in the trauma be conference room.  There was 2 brothers, 2 sisters and 2 nieces present.  We discussed the patient's current lifestyle choices and multiple medical comorbidities.  We also discussed that he is likely not a renal replacement candidate due to the setting of advanced liver disease and now progressed acute renal failure.  They are in agreement that it be most appropriate for transition of care to comfort care measures and that if he looks like he is going to pass away that we let him pass away peacefully.  Orders placed for comfort care and DNR status was placed in the chart here in the emergency department.  I have notified the  patient's primary attending Dr. Sherry Ruffing.  Past Medical History   Past Medical History:  Diagnosis Date  . Alcohol related seizure ([place])    h/o withdrawal sz/notes 04/17/2013 per pt / niece 2018  . At high risk for falls   . ETOH abuse   . 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 GERD (gastroesophageal reflux disease)   . Hypertension   . Neuromuscular disorder ([place])    peripheral neuropathy      Significant Hospital Events    Consults:  Pulmonary critical care  Procedures:   Significant Diagnostic Tests:   Micro Data:   Antimicrobials:   Interim history/subjective:  Please see HPI above  Objective   Blood pressure 121/72, pulse (!) 107, temperature (!) 97.4 F (36.3 C), temperature source Rectal, resp. rate 18, SpO2 98 %.        Intake/Output Summary (Last 24 hours) at 01/06/2020 1337 Last data filed at 01/06/2020 1243 Gross per 24 hour  Intake 178.87 ml  Output --  Net 178.87 ml   There were no vitals filed for this visit.  Examination: General: Frail, emaciated gentleman, confused, significant muscle wasting, temporalis muscle wasting HENT: Disconjugate gaze Lungs: Diminished bilaterally no wheeze Cardiovascular: Regular rate rhythm S1-S2 Abdomen: Soft nontender nondistended Extremities: No significant edema Neuro: Opens eyes to voice does not follow commands GU: Deferred  Resolved Hospital Problem list    Assessment & Plan:   Acute metabolic toxic encephalopathy secondary to alcohol abuse, cirrhosis of the liver  and the development of acute hepatic encephalopathy, hyperammonemia Complicated by acute renal failure, lactic acidosis secondary to the inability of clearance from the liver Plan: We discussed the patient's multiple medical comorbidities with patient's family. We discussed that he likely would need to be placed on mechanical life support.  If we wanted to do everything to keep him  alive. Even if we were to do this due to his significant medical comorbidities, advanced liver disease and now acute renal failure he would likely still passed away. Family has made the decision that they would like to pursue comfort care measures and are in agreement with palliative care involvement. I have consulted palliative care. I have also placed comfort care orders to include as needed pain medications and antianxiety medications. We will recommend admission to the general medical floor with palliative care involvement for symptom management.  Pulmonary critical care will sign off at this time.  We appreciate consultation.  Labs   CBC: Recent Labs  Lab 01/06/20 1019  WBC 20.2*  NEUTROABS 17.0*  HGB 10.8*  HCT 30.8*  MCV 94.8  PLT 939    Basic Metabolic Panel: Recent Labs  Lab 01/06/20 1019  NA 125*  K 3.7  CL 76*  CO2 31  GLUCOSE 121*  BUN 57*  CREATININE 4.51*  CALCIUM 8.7*  MG 2.1   GFR: Estimated Creatinine Clearance: 17.4 mL/min (A) (by C-G formula based on SCr of 4.51 mg/dL (H)). Recent Labs  Lab 01/06/20 1019  WBC 20.2*  LATICACIDVEN 5.4*    Liver Function Tests: Recent Labs  Lab 01/06/20 1019  AST 86*  ALT 36  ALKPHOS 86  BILITOT 14.5*  PROT 6.9  ALBUMIN 1.9*   Recent Labs  Lab 01/06/20 1019  LIPASE 57*   Recent Labs  Lab 01/06/20 1019  AMMONIA 117*    ABG    Component Value Date/Time   TCO2 26 07/17/2014 2113     Coagulation Profile: Recent Labs  Lab 01/06/20 1019  INR 2.3*    Cardiac Enzymes: No results for input(s): CKTOTAL, CKMB, CKMBINDEX, TROPONINI in the last 168 hours.  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   Surgical History    Past Surgical History:  Procedure Laterality Date  . NO PAST SURGERIES       Social History   reports that he has been smoking cigarettes. He has a 7.50 pack-year smoking history. He has never used smokeless tobacco. He reports current alcohol use. He reports that he does not use drugs.   Family History   His family history includes Diabetes in his mother and sister. There is no history of Colon cancer, Colon polyps, Esophageal cancer, Rectal cancer, or Stomach cancer.   Allergies No Known Allergies   Home Medications  Prior to Admission medications   Medication Sig Start Date End Date Taking? Authorizing Provider  chlordiazePOXIDE (LIBRIUM) 25 MG capsule Take 25 mg by mouth daily.    [provider]  furosemide (LASIX) 20 MG tablet Take 1 tablet (20 mg total) by mouth daily. 01/05/20   Rory Percy, DO  Lactulose  20 GM/30ML SOLN Take 10 mLs (6.6667 g total) by mouth in the morning, at noon, and at bedtime. 01/05/20   Rory Percy, DO  magnesium gluconate (MAGONATE) 30 MG tablet Take 1 tablet (30 mg total) by mouth 2 (two) times daily. 01/03/20   Anderson, Chelsey L, DO  metoCLOPramide (REGLAN) 10 MG tablet Take 1 tablet (10 mg total) by mouth every 6 (six) hours. 06/16/19   Gareth Morgan, MD  pantoprazole (PROTONIX) 20 MG tablet Take 2 tablets (40 mg total) by mouth daily for 14 days. 06/16/19 06/30/19  Gareth Morgan, MD  potassium chloride 20 MEQ TBCR Take 40 mEq by mouth daily. 01/03/20   Anderson, Chelsey L, DO  potassium chloride SA (KLOR-CON) 20 MEQ tablet Take 1 tablet (20 mEq total) by mouth 2 (two) times daily for 5 days. 06/16/19 06/27/19  Gareth Morgan, MD  spironolactone (ALDACTONE) 50 MG tablet Take 1 tablet (50 mg total) by mouth at bedtime. 01/05/20   Rory Percy, DO  thiamine 100 MG tablet Take 1 tablet (100 mg total) by mouth daily. 01/03/20    Richarda Osmond, DO     [facility] 01/06/2020 1:37 PM

## 2020-01-06 NOTE — H&P (Addendum)
[facility] Admission History and Physical Service Pager: [phone]  Patient name: Todd Tran Medical record number: [id number] Date of birth: August 16, 1961 Age: 59 y.o. Gender: male  Primary Care Provider: Guadalupe Dawn, MD Consultants: [facility]CM Code Status: DNR, comfort care Preferred Emergency Contact: Jeani Hawking, niece  Chief Complaint: altered mental status  Assessment and Plan: NOHLAN BURDIN is a 59 y.o. male presenting with altered mental status . PMH is significant for hepatic encephalopathy d/t EtOH, cirrhosis, alcohol withdrawal seizures, peripheral neuropathy, GERD, and HTN.  Acute metabolic toxic encephalopathy 2/2 to ESLD  Hyperammonemia  Acute Renal Failure Mr. Schaumburg is a 59 yo man with significant EtOH abuse who has been declining over recent months at home. He was found altered this morning by family members and brought to the ED. Upon presentation at [facility], patient was confused and had labored breathing. Vital signs have largely been stable despite severity of overall disease: mild tachycadia from 100s to 110s, occasional tachypnea in the mid to low 20s, SpO2 >90% on RA. BP was hypotensive to 89/66 on arrival ~10:30 AM, but after 2L of NS, BP has been normotensive since.  Labs reveal that he has acute renal failure with cr 4.51, last creatinine on 5/7 was 0.9. Patient is also acidotic with LA of 5.4, hyponatremic to 125, K stable at 3.7, ammonia elevated at 117, total bilirubin at 14.5. WBC elevated at 20.2, hgb 10.8, platelets stable at 181. PT/INR 24.1, 2.3; last check on 5/7 PT/INR was 17.1, 1.6. CXR with no active disease. IV meropenem and vancomycin started. Recent RUQ [place] on 5/14 shows changes consistent with cirrhosis and portal hypertension with associated ascites, as well as cholelithiasis and gall bladder sludge. Dr. Valeta Harms from [facility] examined patient and met with family members. Meld score 39, indicating a 53% mortality within 3 months, however,  due to patient's appearance, expect passing to be much sooner. Because patient is so severely ill, he would be unlikely to benefit even from intensive care. Consequently, the family decided to change his code status to DNR and opt for comfort care measures only. On exam patient is obtunded, tachypneic but SpO2 >90% on RA, very icteric sclerae, with muscle wasting, and a distended abdomen. Palliative care has been consulted and they recommend morphine for pain relief, continuing abx to help prevent discomfort from infection, and they will follow tomorrow. Currently holding home medications including spironolactone, lactulose, and lasix due to mental status, but if these would help comfort (for example lasix with reducing fluid distention) can consider adding them or IV equivalency. -Admit to med-surg, FPTS, attending Dr. Nori Riis -Palliative care consulted, appreciate recommendations -Comfort care measures -IV cefepime, vanc -Pain management: morphine for moderate pain, air hunger; hydromorphone for breakthrough pain -Ativan for anxiety -NPO given risk for aspiration, can consider PO if mental status improves  Chronic conditions Given circumstances, holding all PO medications 2/2 to NPO for mental status and risk of aspiration. Can give medications for comfort as IV.   FEN/GI: NPO Prophylaxis: none  Disposition: comfort care  History of Present Illness:  Todd Tran is a 59 y.o. male presenting with altered mental status. 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 He stopped drinking one week ago. This morning his family members found him with altered mental status, not responding. They brought him to the hospital and he was found to have multiple organ systems failing. CCM had a discussion with the family members about his prognosis, and they decided as  a group to make the patient DNR with comfort care. Subsequently, Dr. Nori Riis also discussed this with the family members and  they are in agreement.  Review Of Systems: Per HPI with the following additions:  Review of Systems  Unable to perform ROS: Mental status change  Neurological:       Altered mental status     Patient Active Problem List   Diagnosis Date Noted  . Hepatic encephalopathy ([place]) 01/06/2020  . Cirrhosis of liver ([place]) 01/05/2020  . Bilateral lower extremity edema 12/29/2019  . Poor appetite 06/15/2019  . Weight loss 06/15/2019  . Right inguinal hernia 10/21/2018  . Need for prophylactic vaccination and inoculation against varicella 10/21/2018  . Need for immunization against influenza 10/21/2018  . Alcoholism ([place]) 03/10/2018  . Hematuria 02/25/2018  . Osteoarthritis of left knee 08/07/2016  . Peripheral neuropathy 08/07/2016  . Seizures ([place]) 04/24/2016  . Alcohol use disorder 12/01/2012    Past Medical History: Past Medical History:  Diagnosis Date  . Alcohol related seizure ([place])    h/o withdrawal sz/notes 04/17/2013 per pt / niece 2018  . At high risk for falls   . ETOH abuse   . 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 GERD (gastroesophageal reflux disease)   . Hypertension   . Neuromuscular disorder ([place])    peripheral neuropathy     Past Surgical History: Past Surgical History:  Procedure Laterality Date  . NO PAST SURGERIES      Social History: Social History   Tobacco Use  . Smoking status: Current Every Day Smoker    Packs/day: 0.25    Years: 30.00    Pack years: 7.50    Types: Cigarettes    Last attempt to quit: 01/22/2013    Years since quitting: 6.9  . Smokeless tobacco: Never Used  . Tobacco comment: 2-3 a day   Substance Use Topics  . Alcohol use: Yes    Comment: currently 3x40oz beers per day, used to be "a lot more"  . Drug use: No   Additional social history:  Please also refer to relevant sections of EMR.  Family History: Family History  Problem Relation Age of Onset  .  Diabetes Mother   . Diabetes Sister   . Colon cancer Neg Hx   . Colon polyps Neg Hx   . Esophageal cancer Neg Hx   . Rectal cancer Neg Hx   . Stomach cancer Neg Hx    Allergies and Medications: No Known Allergies No current facility-administered medications on file prior to encounter.   Current Outpatient Medications on File Prior to Encounter  Medication Sig Dispense Refill  . chlordiazePOXIDE (LIBRIUM) 25 MG capsule Take 25 mg by mouth daily.    . folic acid (FOLVITE) 1 MG tablet Take 1 mg by mouth daily.    . furosemide (LASIX) 20 MG tablet Take 1 tablet (20 mg total) by mouth daily. 90 tablet 0  . Lactulose 20 GM/30ML SOLN Take 10 mLs (6.6667 g total) by mouth in the morning, at noon, and at bedtime. 946 mL 1  . magnesium gluconate (MAGONATE) 30 MG tablet Take 1 tablet (30 mg total) by mouth 2 (two) times daily. 30 tablet 0  . metoCLOPramide (REGLAN) 10 MG tablet Take 1 tablet (10 mg total) by mouth every 6 (six) hours. 30 tablet 0  . pantoprazole (PROTONIX) 20 MG tablet Take 2 tablets (40 mg total)  by mouth daily for 14 days. 28 tablet 0  . potassium chloride 20 MEQ TBCR Take 40 mEq by mouth daily. 30 tablet 0  . potassium chloride SA (KLOR-CON) 20 MEQ tablet Take 1 tablet (20 mEq total) by mouth 2 (two) times daily for 5 days. 10 tablet 0  . spironolactone (ALDACTONE) 50 MG tablet Take 1 tablet (50 mg total) by mouth at bedtime. 90 tablet 0  . thiamine 100 MG tablet Take 1 tablet (100 mg total) by mouth daily. 30 tablet 0    Objective: BP 121/72   Pulse (!) 107   Temp (!) 97.4 F (36.3 C) (Rectal)   Resp 18   SpO2 98%  Exam: General: thin, cachectic appearing man, appears older than age, obtunded Eyes: icteric sclerae ENTM: clear oropharynx, dry mucus membranes Neck: supple Cardiovascular: regular rhtythm, fast rate, no m/r/g Respiratory: tachypneic, no accessory muscle usage, CTAB, no wheezes/rales/rhonchi Gastrointestinal: soft, tender to palpation, distended  abdomen MSK: atrophic musculature, warm, dry, trace LE edema Derm: no rashes or lesions Neuro: not alert or oriented, GCS 8 Psych: unable to assess, see above  Labs and Imaging: CBC BMET  Recent Labs  Lab 01/06/20 1019  WBC 20.2*  HGB 10.8*  HCT 30.8*  PLT 181   Recent Labs  Lab 01/06/20 1019  NA 125*  K 3.7  CL 76*  CO2 31  BUN 57*  CREATININE 4.51*  GLUCOSE 121*  CALCIUM 8.7*     EKG: EKG Interpretation  Date/Time:  Saturday Jan 06 2020 09:57:15 EDT Ventricular Rate:  107 PR Interval:    QRS Duration: 92 QT Interval:  317 QTC Calculation: 423 R Axis:   70 Text Interpretation: Sinus tachycardia Multiple ventricular premature complexes Biatrial enlargement Repol abnrm suggests ischemia, diffuse leads When compared to prior, faster rate. No STEMI Confirmed by Antony Blackbird [phone]) on 01/06/2020 11:37:59 AM  DG Chest Portable 1 View  Result Date: 01/06/2020 CLINICAL DATA:  Altered mental status. EXAM: PORTABLE CHEST 1 VIEW COMPARISON:  06/15/2019 FINDINGS: The heart size and mediastinal contours are within normal limits. Both lungs are clear. The visualized skeletal structures are unremarkable. IMPRESSION: No active disease. Electronically Signed   By: Kerby Moors M.D.   On: 01/06/2020 11:02   US Abdomen Limited RUQ  Result Date: 01/05/2020 CLINICAL DATA:  History of alcohol use, possible cirrhosis. EXAM: ULTRASOUND ABDOMEN LIMITED RIGHT UPPER QUADRANT COMPARISON:  None. FINDINGS: Gallbladder: Gallbladder is well distended with gallbladder sludge and cholelithiasis. No significant wall thickening is noted. Mild surrounding ascites is seen. Common bile duct: Diameter: 5 mm. Liver: Nodular contour is noted with diffuse heterogeneity and increased echogenicity consistent with underlying cirrhosis. Portal vein is difficult to visualize likely related to some attenuation related to the underlying cirrhosis. Other: Moderate ascites is noted. IMPRESSION: Changes consistent with  cirrhosis and portal hypertension with associated ascites. The portal vein is difficult to visualize and likely attenuated due to the underlying cirrhosis. Cholelithiasis and gallbladder sludge without complicating factors. Electronically Signed   By: Inez Catalina M.D.   On: 01/05/2020 16:16   Gladys Damme, MD 01/06/2020, 2:04 PM PGY-1, [place] Intern pager: [phone], text pages welcome  FPTS Upper-Level Resident Addendum I have independently interviewed and examined the patient. I have discussed the above with the original author and agree with their documentation. My edits for correction/addition/clarification are in - purple. Please see also any attending notes.  FPTS Service pager: [phone] (text pages welcome through [place])  Wilber Oliphant,  M.D.  PGY-2 01/06/2020 9:30 PM

## 2020-01-06 NOTE — H&P (Deleted)
NAME:  Todd Tran, MRN:  [id number], DOB:  07-May-1961, LOS: 0 ADMISSION DATE:  01/06/2020, CONSULTATION DATE: 01/06/2020 REFERRING MD: Dr. Sherry Ruffing, CHIEF COMPLAINT: Altered mental status  Brief History   This is a 59 year old gentleman past medical history of significant alcohol abuse.  Over the past several months has had significant decline at home.  Was found altered this morning and brought in by family.  Patient was found to have decompensated alcoholic cirrhosis, encephalopathy, hepatic encephalopathy ammonia 117 and acute renal failure creatinine of 4.5.  History of present illness   This is a 59 year old gentleman with a longstanding history of alcohol abuse, history of alcohol-related withdrawal seizures, reflux and hypertension.  He also suffers from peripheral neuropathy related to this.  Patient has recently had ultrasound imaging of the abdomen which revealed evidence of portal hypertension and advanced cirrhosis.  He presents to the emergency department today with altered mental status and confusion.  He also has labored breathing at this point.  He was found to have acute renal failure and acidosis.  Serum creatinine of 4.5.  Lactic acidosis of 5.4.  Found to have sodium of 125 and an ammonia of 117.  I met with the entire patient's family in the trauma be conference room.  There was 2 brothers, 2 sisters and 2 nieces present.  We discussed the patient's current lifestyle choices and multiple medical comorbidities.  We also discussed that he is likely not a renal replacement candidate due to the setting of advanced liver disease and now progressed acute renal failure.  They are in agreement that it be most appropriate for transition of care to comfort care measures and that if he looks like he is going to pass away that we let him pass away peacefully.  Orders placed for comfort care and DNR status was placed in the chart here in the emergency department.  I have notified the  patient's primary attending Dr. Sherry Ruffing.  Past Medical History   Past Medical History:  Diagnosis Date  . Alcohol related seizure ([place])    h/o withdrawal sz/notes 04/17/2013 per pt / niece 2018  . At high risk for falls   . ETOH abuse   . 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 GERD (gastroesophageal reflux disease)   . Hypertension   . Neuromuscular disorder ([place])    peripheral neuropathy      Significant Hospital Events    Consults:  Pulmonary critical care  Procedures:   Significant Diagnostic Tests:   Micro Data:   Antimicrobials:   Interim history/subjective:  Please see HPI above  Objective   Blood pressure 115/86, pulse (!) 107, temperature (!) 97.4 F (36.3 C), temperature source Rectal, resp. rate 20, SpO2 97 %.       No intake or output data in the 24 hours ending 01/06/20 1208 There were no vitals filed for this visit.  Examination: General: Frail, emaciated gentleman, confused, significant muscle wasting, temporalis muscle wasting HENT: Disconjugate gaze Lungs: Diminished bilaterally no wheeze Cardiovascular: Regular rate rhythm S1-S2 Abdomen: Soft nontender nondistended Extremities: No significant edema Neuro: Opens eyes to voice does not follow commands GU: Deferred  Resolved Hospital Problem list    Assessment & Plan:   Acute metabolic toxic encephalopathy secondary to alcohol abuse, cirrhosis of the liver and the development of acute hepatic encephalopathy, hyperammonemia Complicated by acute renal failure, lactic acidosis secondary to the inability of  clearance from the liver Plan: We discussed the patient's multiple medical comorbidities with patient's family. We discussed that he likely would need to be placed on mechanical life support.  If we wanted to do everything to keep him alive. Even if we were to do this due to his significant medical comorbidities, advanced liver  disease and now acute renal failure he would likely still passed away. Family has made the decision that they would like to pursue comfort care measures and are in agreement with palliative care involvement. I have consulted palliative care. I have also placed comfort care orders to include as needed pain medications and antianxiety medications. We will recommend admission to the general medical floor with palliative care involvement for symptom management.  Pulmonary critical care will sign off at this time.  We appreciate consultation.  Labs   CBC: Recent Labs  Lab 01/06/20 1019  WBC 20.2*  NEUTROABS 17.0*  HGB 10.8*  HCT 30.8*  MCV 94.8  PLT 448    Basic Metabolic Panel: Recent Labs  Lab 01/06/20 1019  NA 125*  K 3.7  CL 76*  CO2 31  GLUCOSE 121*  BUN 57*  CREATININE 4.51*  CALCIUM 8.7*  MG 2.1   GFR: Estimated Creatinine Clearance: 17.4 mL/min (A) (by C-G formula based on SCr of 4.51 mg/dL (H)). Recent Labs  Lab 01/06/20 1019  WBC 20.2*  LATICACIDVEN 5.4*    Liver Function Tests: Recent Labs  Lab 01/06/20 1019  AST 86*  ALT 36  ALKPHOS 86  BILITOT 14.5*  PROT 6.9  ALBUMIN 1.9*   Recent Labs  Lab 01/06/20 1019  LIPASE 57*   Recent Labs  Lab 01/06/20 1019  AMMONIA 117*    ABG    Component Value Date/Time   TCO2 26 07/17/2014 2113     Coagulation Profile: Recent Labs  Lab 01/06/20 1019  INR 2.3*    Cardiac Enzymes: No results for input(s): CKTOTAL, CKMB, CKMBINDEX, TROPONINI in the last 168 hours.  HbA1C: Hemoglobin A1C  Date/Time Value Ref Range Status  02/10/2017 11:04 AM 5.3  Final    CBG: Recent Labs  Lab 01/06/20 1001  GLUCAP 108*    Review of Systems:   Unable to be obtained  Past Medical History  He,  has a past medical history of Alcohol related seizure ([place]), At high risk for falls, ETOH abuse, Eye abnormalities, GERD (gastroesophageal reflux disease), Hypertension, and Neuromuscular disorder ([place]).    Surgical History    Past Surgical History:  Procedure Laterality Date  . NO PAST SURGERIES       Social History   reports that he has been smoking cigarettes. He has a 7.50 pack-year smoking history. He has never used smokeless tobacco. He reports current alcohol use. He reports that he does not use drugs.   Family History   His family history includes Diabetes in his mother and sister. There is no history of Colon cancer, Colon polyps, Esophageal cancer, Rectal cancer, or Stomach cancer.   Allergies No Known Allergies   Home Medications  Prior to Admission medications   Medication Sig Start Date End Date Taking? Authorizing Provider  chlordiazePOXIDE (LIBRIUM) 25 MG capsule Take 25 mg by mouth daily.    [provider]  furosemide (LASIX) 20 MG tablet Take 1 tablet (20 mg total) by mouth daily. 01/05/20   Rory Percy, DO  Lactulose 20 GM/30ML SOLN Take 10 mLs (6.6667 g total) by mouth in the morning, at noon, and at bedtime. 01/05/20  Rory Percy, DO  magnesium gluconate (MAGONATE) 30 MG tablet Take 1 tablet (30 mg total) by mouth 2 (two) times daily. 01/03/20   Anderson, Chelsey L, DO  metoCLOPramide (REGLAN) 10 MG tablet Take 1 tablet (10 mg total) by mouth every 6 (six) hours. 06/16/19   Gareth Morgan, MD  pantoprazole (PROTONIX) 20 MG tablet Take 2 tablets (40 mg total) by mouth daily for 14 days. 06/16/19 06/30/19  Gareth Morgan, MD  potassium chloride 20 MEQ TBCR Take 40 mEq by mouth daily. 01/03/20   Anderson, Chelsey L, DO  potassium chloride SA (KLOR-CON) 20 MEQ tablet Take 1 tablet (20 mEq total) by mouth 2 (two) times daily for 5 days. 06/16/19 06/27/19  Gareth Morgan, MD  spironolactone (ALDACTONE) 50 MG tablet Take 1 tablet (50 mg total) by mouth at bedtime. 01/05/20   Rory Percy, DO  thiamine 100 MG tablet Take 1 tablet (100 mg total) by mouth daily. 01/03/20   Richarda Osmond, DO     [facility] 01/06/2020 1:35 PM

## 2020-01-06 NOTE — ED Provider Notes (Signed)
[facility] EMERGENCY DEPARTMENT Provider Note   CSN: [id number] Arrival date & time: 01/06/20  7425     History Chief Complaint  Patient presents with  . Altered Mental Status    Todd Tran is a 59 y.o. male.  The history is provided by medical records and a relative. The history is limited by the condition of the patient. No language interpreter was used.  Altered Mental Status Presenting symptoms: confusion and partial responsiveness   Severity:  Severe Most recent episode:  Today Episode history:  Single Timing:  Constant Progression:  Worsening Chronicity:  New Associated symptoms: vomiting    LVL 5 caveat for AMS     Past Medical History:  Diagnosis Date  . Alcohol related seizure (HCC)    h/o withdrawal sz/notes 04/17/2013 per pt / niece 2018  . At high risk for falls   . ETOH abuse   . 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 GERD (gastroesophageal reflux disease)   . Hypertension   . Neuromuscular disorder [facility])    peripheral neuropathy     Patient Active Problem List   Diagnosis Date Noted  . Cirrhosis of liver (HCC) 01/05/2020  . Bilateral lower extremity edema 12/29/2019  . Poor appetite 06/15/2019  . Weight loss 06/15/2019  . Right inguinal hernia 10/21/2018  . Need for prophylactic vaccination and inoculation against varicella 10/21/2018  . Need for immunization against influenza 10/21/2018  . Alcoholism (HCC) 03/10/2018  . Hematuria 02/25/2018  . Osteoarthritis of left knee 08/07/2016  . Peripheral neuropathy 08/07/2016  . Seizures (HCC) 04/24/2016  . Alcohol use disorder 12/01/2012    Past Surgical History:  Procedure Laterality Date  . NO PAST SURGERIES         Family History  Problem Relation Age of Onset  . Diabetes Mother   . Diabetes Sister   . Colon cancer Neg Hx   . Colon polyps Neg Hx   . Esophageal cancer Neg Hx   . Rectal cancer Neg  Hx   . Stomach cancer Neg Hx     Social History   Tobacco Use  . Smoking status: Current Every Day Smoker    Packs/day: 0.25    Years: 30.00    Pack years: 7.50    Types: Cigarettes    Last attempt to quit: 01/22/2013    Years since quitting: 6.9  . Smokeless tobacco: Never Used  . Tobacco comment: 2-3 a day   Substance Use Topics  . Alcohol use: Yes    Comment: currently 3x40oz beers per day, used to be "a lot more"  . Drug use: No    Home Medications Prior to Admission medications   Medication Sig Start Date End Date Taking? Authorizing Provider  chlordiazePOXIDE (LIBRIUM) 25 MG capsule Take 25 mg by mouth daily.    [provider]  furosemide (LASIX) 20 MG tablet Take 1 tablet (20 mg total) by mouth daily. 01/05/20   Ellwood Dense, DO  Lactulose 20 GM/30ML SOLN Take 10 mLs (6.6667 g total) by mouth in the morning, at noon, and at bedtime. 01/05/20   Ellwood Dense, DO  magnesium gluconate (MAGONATE) 30 MG tablet Take 1 tablet (30 mg total) by mouth 2 (two) times daily. 01/03/20   Anderson, Chelsey L, DO  metoCLOPramide (REGLAN) 10 MG tablet Take 1 tablet (10 mg total) by mouth every 6 (six) hours. 06/16/19  Gareth Morgan, MD  pantoprazole (PROTONIX) 20 MG tablet Take 2 tablets (40 mg total) by mouth daily for 14 days. 06/16/19 06/30/19  Gareth Morgan, MD  potassium chloride 20 MEQ TBCR Take 40 mEq by mouth daily. 01/03/20   Anderson, Chelsey L, DO  potassium chloride SA (KLOR-CON) 20 MEQ tablet Take 1 tablet (20 mEq total) by mouth 2 (two) times daily for 5 days. 06/16/19 06/27/19  Gareth Morgan, MD  spironolactone (ALDACTONE) 50 MG tablet Take 1 tablet (50 mg total) by mouth at bedtime. 01/05/20   Rory Percy, DO  thiamine 100 MG tablet Take 1 tablet (100 mg total) by mouth daily. 01/03/20   Doristine Mango L, DO    Allergies    Patient has no known allergies.  Review of Systems   Review of Systems  Unable to perform ROS: Mental status change    Respiratory: Positive for cough.   Gastrointestinal: Positive for vomiting.  Psychiatric/Behavioral: Positive for confusion.    Physical Exam Updated Vital Signs BP (!) 120/105   Pulse (!) 112   Temp (!) 97.4 F (36.3 C) (Rectal)   Resp 16   SpO2 99%   Physical Exam Vitals and nursing note reviewed.  Constitutional:      Appearance: He is ill-appearing and toxic-appearing.     Comments: Pt is somnolent   HENT:     Nose: Nose normal. No congestion or rhinorrhea.     Mouth/Throat:     Mouth: Mucous membranes are dry.     Pharynx: No oropharyngeal exudate or posterior oropharyngeal erythema.  Eyes:     General: Scleral icterus present.  Cardiovascular:     Rate and Rhythm: Tachycardia present.     Pulses: Normal pulses.     Heart sounds: No murmur.  Pulmonary:     Effort: Pulmonary effort is normal.     Breath sounds: Rhonchi present. No wheezing or rales.  Chest:     Chest wall: No tenderness.  Abdominal:     Tenderness: There is no abdominal tenderness.  Musculoskeletal:        General: No tenderness.     Cervical back: No tenderness.  Skin:    Coloration: Skin is jaundiced.     Findings: No erythema.  Neurological:     Mental Status: He is lethargic.     Motor: Abnormal muscle tone present.     Comments: somnolent     ED Results / Procedures / Treatments   Labs (all labs ordered are listed, but only abnormal results are displayed) Labs Reviewed  CBC WITH DIFFERENTIAL/PLATELET - Abnormal; Notable for the following components:      Result Value   WBC 20.2 (*)    RBC 3.25 (*)    Hemoglobin 10.8 (*)    HCT 30.8 (*)    RDW 17.2 (*)    Neutro Abs 17.0 (*)    Monocytes Absolute 1.9 (*)    Abs Immature Granulocytes 0.13 (*)    All other components within normal limits  COMPREHENSIVE METABOLIC PANEL - Abnormal; Notable for the following components:   Sodium 125 (*)    Chloride 76 (*)    Glucose, Bld 121 (*)    BUN 57 (*)    Creatinine, Ser 4.51 (*)     Calcium 8.7 (*)    Albumin 1.9 (*)    AST 86 (*)    Total Bilirubin 14.5 (*)    GFR calc non Af Amer 13 (*)    GFR calc Af Wyvonnia Lora  15 (*)    Anion gap 18 (*)    All other components within normal limits  LIPASE, BLOOD - Abnormal; Notable for the following components:   Lipase 57 (*)    All other components within normal limits  PROTIME-INR - Abnormal; Notable for the following components:   Prothrombin Time 24.1 (*)    INR 2.3 (*)    All other components within normal limits  AMMONIA - Abnormal; Notable for the following components:   Ammonia 117 (*)    All other components within normal limits  LACTIC ACID, PLASMA - Abnormal; Notable for the following components:   Lactic Acid, Venous 5.4 (*)    All other components within normal limits  APTT - Abnormal; Notable for the following components:   aPTT 41 (*)    All other components within normal limits  CBG MONITORING, ED - Abnormal; Notable for the following components:   Glucose-Capillary 108 (*)    All other components within normal limits  SARS CORONAVIRUS 2 BY RT PCR (HOSPITAL ORDER, PERFORMED IN [facility])  CULTURE, BLOOD (ROUTINE X 2)  CULTURE, BLOOD (ROUTINE X 2)  MAGNESIUM  HEPATITIS PANEL, ACUTE  BRAIN NATRIURETIC PEPTIDE    EKG EKG Interpretation  Date/Time:  Saturday Jan 06 2020 09:57:15 EDT Ventricular Rate:  107 PR Interval:    QRS Duration: 92 QT Interval:  317 QTC Calculation: 423 R Axis:   70 Text Interpretation: Sinus tachycardia Multiple ventricular premature complexes Biatrial enlargement Repol abnrm suggests ischemia, diffuse leads When compared to prior, faster rate. No STEMI Confirmed by Theda Belfast ([id number]) on 01/06/2020 11:37:59 AM   Radiology DG Chest Portable 1 View  Result Date: 01/06/2020 CLINICAL DATA:  Altered mental status. EXAM: PORTABLE CHEST 1 VIEW COMPARISON:  06/15/2019 FINDINGS: The heart size and mediastinal contours are within normal limits. Both lungs are clear.  The visualized skeletal structures are unremarkable. IMPRESSION: No active disease. Electronically Signed   By: Signa Kell M.D.   On: 01/06/2020 11:02   US Abdomen Limited RUQ  Result Date: 01/05/2020 CLINICAL DATA:  History of alcohol use, possible cirrhosis. EXAM: ULTRASOUND ABDOMEN LIMITED RIGHT UPPER QUADRANT COMPARISON:  None. FINDINGS: Gallbladder: Gallbladder is well distended with gallbladder sludge and cholelithiasis. No significant wall thickening is noted. Mild surrounding ascites is seen. Common bile duct: Diameter: 5 mm. Liver: Nodular contour is noted with diffuse heterogeneity and increased echogenicity consistent with underlying cirrhosis. Portal vein is difficult to visualize likely related to some attenuation related to the underlying cirrhosis. Other: Moderate ascites is noted. IMPRESSION: Changes consistent with cirrhosis and portal hypertension with associated ascites. The portal vein is difficult to visualize and likely attenuated due to the underlying cirrhosis. Cholelithiasis and gallbladder sludge without complicating factors. Electronically Signed   By: Alcide Clever M.D.   On: 01/05/2020 16:16    Procedures Procedures (including critical care time)  CRITICAL CARE Performed by: Canary Brim Vikash Nest Total critical care time: 45 minutes Critical care time was exclusive of separately billable procedures and treating other patients. Critical care was necessary to treat or prevent imminent or life-threatening deterioration. Critical care was time spent personally by me on the following activities: development of treatment plan with patient and/or surrogate as well as nursing, discussions with consultants, evaluation of patient's response to treatment, examination of patient, obtaining history from patient or surrogate, ordering and performing treatments and interventions, ordering and review of laboratory studies, ordering and review of radiographic studies, pulse oximetry and  re-evaluation of patient's  condition.   Medications Ordered in ED Medications  ceFEPIme (MAXIPIME) 2 g in sodium chloride 0.9 % 100 mL IVPB (has no administration in time range)  vancomycin variable dose per unstable renal function (pharmacist dosing) (has no administration in time range)  morphine CONCENTRATE 10 MG/0.5ML oral solution 5 mg (has no administration in time range)    Or  morphine CONCENTRATE 10 MG/0.5ML oral solution 5 mg (has no administration in time range)  LORazepam (ATIVAN) tablet 1 mg (has no administration in time range)    Or  LORazepam (ATIVAN) 2 MG/ML concentrated solution 1 mg (has no administration in time range)    Or  LORazepam (ATIVAN) injection 1 mg (has no administration in time range)  chlorproMAZINE (THORAZINE) 12.5 mg in sodium chloride 0.9 % 25 mL IVPB (has no administration in time range)  HYDROmorphone (DILAUDID) injection 0.5-1 mg (1 mg Intravenous Given 01/06/20 1431)  ceFEPIme (MAXIPIME) 2 g in sodium chloride 0.9 % 100 mL IVPB (0 g Intravenous Stopped 01/06/20 1211)  metroNIDAZOLE (FLAGYL) IVPB 500 mg (0 mg Intravenous Stopped 01/06/20 1243)  vancomycin (VANCOREADY) IVPB 1250 mg/250 mL (0 mg Intravenous Stopped 01/06/20 1430)  sodium chloride 0.9 % bolus 2,073 mL (0 mL/kg  69.1 kg Intravenous Stopped 01/06/20 1431)    ED Course  I have reviewed the triage vital signs and the nursing notes.  Pertinent labs & imaging results that were available during my care of the patient were reviewed by me and considered in my medical decision making (see chart for details).    MDM Rules/Calculators/A&P                      MONTERIUS ROLF is a 59 y.o. male with a past medical history significant for prior alcoholism, seizures, hypertension, peripheral neuropathies, and recent concern for cirrhosis of the liver who presents with altered mental status, cough, and jaundice.  According to patient's brother who provided information, patient has been doing poorly for  the last month.  He was seen about a week ago and had some blood work done showing some concern for liver disease.  Patient then was seen by his family medicine PCP yesterday who was also concerned about worsening liver disease and failure.  He had an ultrasound done yesterday which showed cirrhosis but no evidence of acute cholecystitis.  Family reports that this morning, patient has been altered and not breathing correctly.  He has been having some coughing and burping and has intermittently been following commands.  They report that previously he was able to answer some questions and was just more tired and fatigued.  Now he is unable to speak and is somnolent.  He is also having some gasping breathing at times and had some nausea and vomiting earlier.  On arrival, he is afebrile but is tachycardic and tachypneic.  Patient is maintaining his oxygen saturations on room air but is having some gurgling breathing.  I am somewhat concerned about aspiration given the patient's mental status abnormalities.  When I yell loudly, patient is able to follow some commands and squeeze both of his hands and wiggle his toes.  He also will track where I am in the room when I am speaking.  Patient's lungs had coarse breath sounds bilaterally but chest abdomen did not appear to be tender.  He did not grimace when I pushed on his abdomen.  He had a good blink to threat.  He has some jaundice on exam including scleral icterus.  Back was nontender and I did not see evidence of trauma.  Clinically I am very concerned about this patient.  I am concerned about his static encephalopathy being the primary cause of symptoms however with this nausea vomiting, poor mental status, and the coughing/gurgly breathing, I am concerned he may have aspirated at some point overnight.  We will get a chest x-ray and get labs.  We will also get work-up to help determine etiology of his altered mental status.  I do not feel it is safe for discharge  home when work-up is completed.  Had a long shared decision-making conversation with patient and the brother about intubation at this time.  As the patient still following some commands, will hold on intubation however his mental status worsens, would likely pursue intubation.   Blood work began to return and white blood cell count was over 20.  Given the tachycardia, tachypnea, coarse breath sounds, and the white blood cell count, he will be made a code sepsis and will be started on broad-spectrum antibiotics.  We will also get urine, blood cultures, and head CT when appropriate.  Given his mental status, we decided to hold on head CT with concern for aspiration until other work-up is completed.  Anticipate admission when work-up has progressed.  Anticipate speaking with critical care due to the patient's tenuous mental status and respiratory status.  1:22 PM Critical care just came to talk to me and report that they had a long conversation with 6 of the patient's family members and the patient and have decided to pursue palliative management given the patient's multisystem organ failure likely related to hepatic encephalopathy and cirrhosis.  The critical care team will contact palliative care and try to contact the hospitalist team however they requested we speak with family medicine team to get him admitted for palliative management.    Final Clinical Impression(s) / ED Diagnoses Final diagnoses:  Jaundice  Altered mental status, unspecified altered mental status type  Hepatic encephalopathy (HCC)  Sepsis, due to unspecified organism, unspecified whether acute organ dysfunction present [facility](HCC)    Clinical Impression: 1. Altered mental status, unspecified altered mental status type   2. Jaundice   3. Hepatic encephalopathy (HCC)   4. Sepsis, due to unspecified organism, unspecified whether acute organ dysfunction present [facility](HCC)     Disposition: Admit  This note was prepared with  assistance of Dragon voice recognition software. Occasional wrong-word or sound-a-like substitutions may have occurred due to the inherent limitations of voice recognition software.     Bena Kobel, Canary Brimhristopher J, MD 01/06/20 1655

## 2020-01-06 NOTE — ED Notes (Signed)
X-ray at bedside

## 2020-01-06 NOTE — Progress Notes (Addendum)
PCCM attending:  I met with the patient's family, 2 brothers, 2 sisters and 2 nieces.  We discussed the patient's current prognosis as well as comorbidities to include advanced end-stage liver disease, cirrhosis, portal hypertension, acute renal failure with a creatinine of 4.5, acute metabolic encephalopathy secondary to hepatic encephalopathy and ammonia of 117.  We discussed the utility of mechanical life support in this situation.  We also discussed the fact that he would likely not be a candidate for renal replacement therapy in the setting of advanced liver disease.  The patient's family has made the decision for DNR status.  And admission to the hospital with focus on comfort care measures.  I have placed these orders as well as consultation to the palliative care team.  I have notified the patient's primary attending Dr. Sherry Ruffing.  28 minutes spent advanced care planning.  Garner Nash, DO [facility] 01/06/2020 1:27 PM

## 2020-01-06 NOTE — ED Notes (Signed)
Add will add on APTT

## 2020-01-07 ENCOUNTER — Encounter (HOSPITAL_COMMUNITY): Payer: Self-pay | Admitting: Family Medicine

## 2020-01-07 DIAGNOSIS — K7031 Alcoholic cirrhosis of liver with ascites: Secondary | ICD-10-CM

## 2020-01-07 DIAGNOSIS — R4182 Altered mental status, unspecified: Secondary | ICD-10-CM

## 2020-01-07 DIAGNOSIS — A419 Sepsis, unspecified organism: Principal | ICD-10-CM

## 2020-01-07 DIAGNOSIS — K7290 Hepatic failure, unspecified without coma: Secondary | ICD-10-CM

## 2020-01-07 MED ORDER — ONDANSETRON HCL 4 MG/2ML IJ SOLN
4.0000 mg | Freq: Four times a day (QID) | INTRAMUSCULAR | Status: DC | PRN
  Administered 2020-01-07: 4 mg via INTRAVENOUS
  Filled 2020-01-07: qty 2

## 2020-01-07 MED ORDER — SODIUM CHLORIDE 0.9 % IV SOLN: 8.0000 mg | Freq: Three times a day (TID) | INTRAVENOUS | Status: DC

## 2020-01-07 MED ORDER — SODIUM CHLORIDE 0.9 % IV SOLN
2.0000 g | INTRAVENOUS | Status: DC
  Administered 2020-01-07 – 2020-01-08 (×2): 2 g via INTRAVENOUS
  Filled 2020-01-07 (×3): qty 2

## 2020-01-07 NOTE — Progress Notes (Signed)
Palliative:  Consult received. Reviewed chart - patient has already been transitioned to comfort care. Discussed with Dr. Allena Katz - for now, goals are clear and symptoms well managed. Family with good understanding of situation and have discussed plan of care with primary team. No additional needs for palliative involvement. Primary team anticipates hospital death. Dr. Allena Katz has my contact info and will reach out with any further needs.   Gerlean Ren, DNP, AGNP-C Palliative Medicine Team Team Phone # [phone]  Pager # [phone]  NO CHARGE

## 2020-01-07 NOTE — Progress Notes (Signed)
Family Medicine Teaching Service Daily Progress Note Intern Pager: [phone]  Patient name: Todd Tran Medical record number: [id number] Date of birth: [birth year] Age: 59 y.o. Gender: male  Primary Care Provider: Myrene Buddy, MD Consultants: Palliative care, CCM Code Status: DNR  Pt Overview and Major Events to Date:  Presented to the ED on 5/15 with AMS, acute renal failure, transitioned to comfort care after discussion between CCM, primary team, and family  Assessment and Plan:  Acute metabolic toxic encephalopathy and acute renal failure 2/2 to ESLD Patient with history of alcohol abuse and recent declining health who presented on 5/15 with altered mental status.  Was found to be hypotensive, hyponatremic, hyperammonemic with acute renal failure.  Given his WBC count of 20, sepsis was also considered to be a component of his presentation, and he was started on broad-spectrum antibiotics.  Given his severe, chronic illness, CCM did not feel that intensive care would provide him any meaningful benefit.  After discussion with the family, they decided to pursue comfort care.   -Continue comfort care measures including Ativan, morphine, chlorpromazine, Dilaudid -Continue cefepime to reduce symptoms due to infection -Appreciate palliative care recommendations -no labs   Disposition: Comfort care, likely to remain inpatient  Subjective:  Patient somnolent and unable to converse with me this morning.  Objective: Temp:  [97.4 F (36.3 C)-99.1 F (37.3 C)] 99.1 F (37.3 C) (05/15 2022) Pulse Rate:  [100-113] 105 (05/15 1918) Resp:  [15-27] 27 (05/15 1918) BP: (89-144)/(50-105) 131/50 (05/15 1918) SpO2:  [97 %-100 %] 97 % (05/15 1918) Physical Exam: General: thin, chronically ill appearing man sleeping comfortably Cardiovascular: RRR, no MRG Respiratory: agonal breathing but does not appear uncomfortable; CTAB Abdomen: soft, nontender Extremities: warm, no pedal  edema  Laboratory: Recent Labs  Lab 01/06/20 1019  WBC 20.2*  HGB 10.8*  HCT 30.8*  PLT 181   Recent Labs  Lab 01/06/20 1019  NA 125*  K 3.7  CL 76*  CO2 31  BUN 57*  CREATININE 4.51*  CALCIUM 8.7*  PROT 6.9  BILITOT 14.5*  ALKPHOS 86  ALT 36  AST 86*  GLUCOSE 121*     Imaging/Diagnostic Tests: DG Chest Portable 1 View  Result Date: 01/06/2020 CLINICAL DATA:  Altered mental status. EXAM: PORTABLE CHEST 1 VIEW COMPARISON:  06/15/2019 FINDINGS: The heart size and mediastinal contours are within normal limits. Both lungs are clear. The visualized skeletal structures are unremarkable. IMPRESSION: No active disease. Electronically Signed   By: Signa Kell M.D.   On: 01/06/2020 11:02   US Abdomen Limited RUQ  Result Date: 01/05/2020 CLINICAL DATA:  History of alcohol use, possible cirrhosis. EXAM: ULTRASOUND ABDOMEN LIMITED RIGHT UPPER QUADRANT COMPARISON:  None. FINDINGS: Gallbladder: Gallbladder is well distended with gallbladder sludge and cholelithiasis. No significant wall thickening is noted. Mild surrounding ascites is seen. Common bile duct: Diameter: 5 mm. Liver: Nodular contour is noted with diffuse heterogeneity and increased echogenicity consistent with underlying cirrhosis. Portal vein is difficult to visualize likely related to some attenuation related to the underlying cirrhosis. Other: Moderate ascites is noted. IMPRESSION: Changes consistent with cirrhosis and portal hypertension with associated ascites. The portal vein is difficult to visualize and likely attenuated due to the underlying cirrhosis. Cholelithiasis and gallbladder sludge without complicating factors. Electronically Signed   By: Alcide Clever M.D.   On: 01/05/2020 16:16    Lennox Solders, MD 01/07/2020, 12:16 AM PGY-3, [facility] FPTS Intern pager: [phone], text pages welcome

## 2020-01-07 NOTE — Progress Notes (Signed)
Patient is now comfort care. Palliative medications have been prescribed.   Towanda Octave MD  PGY-1, [facility]

## 2020-01-07 NOTE — Progress Notes (Signed)
Telephone call to patient's son. Updated him on the clinical status of Mr. Hartwell. I explained that he is end-of-life and is only having comfort care medications. I recommended him and other family members would be able to come in to be with Mr. Weathers at any point. He said that he would be on his way shortly.  Towanda Octave, MD PGY1 [facility]

## 2020-01-08 DIAGNOSIS — R17 Unspecified jaundice: Secondary | ICD-10-CM

## 2020-01-08 DIAGNOSIS — R400 Somnolence: Secondary | ICD-10-CM

## 2020-01-08 NOTE — Progress Notes (Addendum)
Family Medicine Teaching Service Daily Progress Note Intern Pager: [phone]  Patient name: Todd Tran Medical record number: [id number] Date of birth: 04-10-1961 Age: 59 y.o. 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 Appears to be in no acute distress.  Over last 24 hrs patient has received dilaudid X 2, ativan IV x 1, zofran x 1 -Continue comfort care measures including Ativan, morphine, chlorpromazine, Dilaudid, Zofran  -Continue cefepime to reduce symptoms due to infection -No labs -Patient does not deteriorate further, consider transfer to hospice, will reassess this tomorrow morning.  Disposition: Comfort care, will likely pass away in hospital   Subjective:  Less somnolent this morning. Able to squeeze my hand slightly and open his eyes.   Objective: Temp:  [97.5 F (36.4 C)-98.3 F (36.8 C)] 97.5 F (36.4 C) (05/16 2221) Pulse Rate:  [101-104] 101 (05/16 2221) Resp:  [19-24] 24 (05/16 2221) BP: (92-123)/(53-65) 92/53 (05/16 2221) SpO2:  [93 %-100 %] 93 % (05/16 2221)  Physical Exam: General: Somnolent but rousable, frail appearing 59 yr old male, agonal breathing Cardio: Normal S1 and S2, RRR Pulm: CTAB, normal WOB  Abdomen: Bowel sounds normal. Abdomen soft and non-tender.  Extremities: No peripheral edema. Warm/ well perfused.  Neuro: Cranial nerves grossly intact  Laboratory: Recent Labs  Lab 01/06/20 1019  WBC 20.2*  HGB 10.8*  HCT 30.8*  PLT 181   Recent Labs  Lab 01/06/20 1019  NA 125*  K 3.7  CL 76*  CO2 31  BUN  57*  CREATININE 4.51*  CALCIUM 8.7*  PROT 6.9  BILITOT 14.5*  ALKPHOS 86  ALT 36  AST 86*  GLUCOSE 121*     Imaging/Diagnostic Tests: DG Chest Portable 1 View  Result Date: 01/06/2020 CLINICAL DATA:  Altered mental status. EXAM: PORTABLE CHEST 1 VIEW COMPARISON:  06/15/2019 FINDINGS: The heart size and mediastinal contours are within normal limits. Both lungs are clear. The visualized skeletal structures are unremarkable. IMPRESSION: No active disease. Electronically Signed   By: Signa Kell M.D.   On: 01/06/2020 11:02   US Abdomen Limited RUQ  Result Date: 01/05/2020 CLINICAL DATA:  History of alcohol use, possible cirrhosis. EXAM: ULTRASOUND ABDOMEN LIMITED RIGHT UPPER QUADRANT COMPARISON:  None. FINDINGS: Gallbladder: Gallbladder is well distended with gallbladder sludge and cholelithiasis. No significant wall thickening is noted. Mild surrounding ascites is seen. Common bile duct: Diameter: 5 mm. Liver: Nodular contour is noted with diffuse heterogeneity and increased echogenicity consistent with underlying cirrhosis. Portal vein is difficult to visualize likely related to some attenuation related to the underlying cirrhosis. Other: Moderate ascites is noted. IMPRESSION: Changes consistent with cirrhosis and portal hypertension with associated ascites. The portal vein is difficult to visualize and likely attenuated due to the underlying cirrhosis. Cholelithiasis and gallbladder sludge without complicating factors. Electronically Signed   By: Alcide Clever M.D.   On: 01/05/2020 16:16    Towanda Octave, MD 01/08/2020, 7:45 AM PGY-1, [facility] FPTS Intern pager: [phone], text pages welcome

## 2020-01-09 MED ORDER — CIPROFLOXACIN HCL 500 MG PO TABS
500.0000 mg | ORAL_TABLET | Freq: Every day | ORAL | Status: DC
  Administered 2020-01-09: 500 mg via ORAL
  Filled 2020-01-09 (×2): qty 1

## 2020-01-09 NOTE — Progress Notes (Signed)
I left a message on Todd Tran niece's phone saying that I hoped to speak with one of Todd Tran children regarding our plan for him.  I also called the other 2 numbers on Todd Tran chart, 1 of which was picked up by his girlfriend.  I asked her if she had the number for his children, and she did not.  The other number had a voicemail that was not set up.  We will continue to try to get in touch with his children.  Todd Tran C. Frances Furbish, MD PGY-3, [facility] 01/09/2020 2:41 PM

## 2020-01-09 NOTE — Progress Notes (Signed)
Called patient's son about home with hospice. He would like pt to be sent home with hospice however is unsure if the this would be covered by his insurance. I said I will discuss with case management.  Towanda Octave MD PGY-1, [facility]

## 2020-01-09 NOTE — Plan of Care (Signed)

## 2020-01-09 NOTE — Progress Notes (Signed)
Family Medicine Teaching Service Daily Progress Note Intern Pager: [phone]  Patient name: Todd Tran Medical record number: [id number] Date of birth: 1961/07/03 Age: 59 y.o. 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 He was speaking in response to my questions but the words were unclear. Also has new hiccups but not appear to be in pain/distress. Spoke with RN to give patient chlorpromazine. Over last 24 hrs patient has not required any PRN medications. Currently only receiving Cefipime IV. -Continue comfort care measures including Ativan, morphine, chlorpromazine, Dilaudid, Zofran  -Continue cefepime to reduce symptoms due to infection -No labs -Consider transferring to hospice if family in agreement. Will speak to son and palliative team today.  -Encourage PO intake if possible -If tolerates PO could consider oral lactulose for encephalopathy   Disposition: Comfort care, possibly hopsice  Subjective:  No acute events overnight  Objective: Temp:  [97.8 F (36.6 C)-98 F (36.7 C)] 98 F (36.7 C) (05/17 2249) Pulse Rate:  [96-97] 96 (05/17 2249) Resp:  [22] 22 (05/17 2249) BP: (99-114)/(58-64) 99/64 (05/17 2249) SpO2:  [99 %-100 %] 100 % (05/17 2249)  Physical Exam: General: Alert, unable to verbalize clearly, hiccups, no acute distress Cardio: S1 and S2 present, RRR  Pulm: CTAB, mildly increased WOB  Abdomen: Bowel sounds normal. Abdomen soft and non-tender.  Extremities: No peripheral edema. Warm/ well perfused.    Laboratory: Recent  Labs  Lab 01/06/20 1019  WBC 20.2*  HGB 10.8*  HCT 30.8*  PLT 181   Recent Labs  Lab 01/06/20 1019  NA 125*  K 3.7  CL 76*  CO2 31  BUN 57*  CREATININE 4.51*  CALCIUM 8.7*  PROT 6.9  BILITOT 14.5*  ALKPHOS 86  ALT 36  AST 86*  GLUCOSE 121*     Imaging/Diagnostic Tests: DG Chest Portable 1 View  Result Date: 01/06/2020 CLINICAL DATA:  Altered mental status. EXAM: PORTABLE CHEST 1 VIEW COMPARISON:  06/15/2019 FINDINGS: The heart size and mediastinal contours are within normal limits. Both lungs are clear. The visualized skeletal structures are unremarkable. IMPRESSION: No active disease. Electronically Signed   By: Signa Kell M.D.   On: 01/06/2020 11:02   US Abdomen Limited RUQ  Result Date: 01/05/2020 CLINICAL DATA:  History of alcohol use, possible cirrhosis. EXAM: ULTRASOUND ABDOMEN LIMITED RIGHT UPPER QUADRANT COMPARISON:  None. FINDINGS: Gallbladder: Gallbladder is well distended with gallbladder sludge and cholelithiasis. No significant wall thickening is noted. Mild surrounding ascites is seen. Common bile duct: Diameter: 5 mm. Liver: Nodular contour is noted with diffuse heterogeneity and increased echogenicity consistent with underlying cirrhosis. Portal vein is difficult to visualize likely related to some attenuation related to the underlying cirrhosis. Other: Moderate ascites is noted. IMPRESSION: Changes consistent with cirrhosis and portal hypertension with associated ascites. The portal vein is difficult to visualize and likely attenuated due to the underlying cirrhosis. Cholelithiasis and gallbladder sludge without complicating factors. Electronically Signed   By: Alcide Clever M.D.   On: 01/05/2020 16:16    Towanda Octave, MD 01/09/2020, 7:33 AM PGY-1, [place] FPTS Intern pager:  [phone], text pages welcome

## 2020-01-09 NOTE — Hospital Course (Signed)
Started cipro SBP ppx/treatment (renal dosing) PO as his abdominal pain improved over the first three days of IV cefepime.  Patient improved and anticipated hospital death was not less likely. Consult LCSW for hospice at home.

## 2020-01-09 NOTE — TOC Progression Note (Signed)
Transition of Care [facility]) - Progression Note    Patient Details  Name: Todd Tran MRN: [id number] Date of Birth: Jul 10, 1961  [facility]) CM/SW Contact  Erin Sons, [place] Phone Number: 01/09/2020, 4:33 PM  Clinical Narrative:     Called son [phone] [name] to discuss disposition of Home hospice vs [facility]. CSW confirmed that medicaid would cover. Greggory Stallion requested to call csw back after discussing with family.         Expected Discharge Plan and Services                                                 Social Determinants of Health (SDOH) Interventions    Readmission Risk Interventions No flowsheet data found.

## 2020-01-10 DIAGNOSIS — R17 Unspecified jaundice: Secondary | ICD-10-CM

## 2020-01-10 DIAGNOSIS — R634 Abnormal weight loss: Secondary | ICD-10-CM

## 2020-01-10 DIAGNOSIS — R569 Unspecified convulsions: Secondary | ICD-10-CM

## 2020-01-10 MED ORDER — CIPROFLOXACIN HCL 500 MG PO TABS: 500.0000 mg | ORAL_TABLET | Freq: Every day | ORAL | 0 refills | Status: AC

## 2020-01-10 MED ORDER — CIPROFLOXACIN HCL 500 MG PO TABS: 500.0000 mg | ORAL_TABLET | Freq: Every day | ORAL | 0 refills | Status: DC

## 2020-01-10 NOTE — Plan of Care (Signed)
  Problem: Health Behavior/Discharge Planning: Goal: Ability to manage health-related needs will improve Outcome: Progressing   Problem: Clinical Measurements: Goal: Will remain free from infection Outcome: Progressing Goal: Diagnostic test results will improve Outcome: Progressing Goal: Respiratory complications will improve Outcome: Progressing Goal: Cardiovascular complication will be avoided Outcome: Progressing   Problem: Nutrition: Goal: Adequate nutrition will be maintained Outcome: Progressing   Problem: Coping: Goal: Level of anxiety will decrease Outcome: Progressing   Problem: Elimination: Goal: Will not experience complications related to bowel motility Outcome: Progressing Goal: Will not experience complications related to urinary retention Outcome: Progressing   Problem: Pain Managment: Goal: General experience of comfort will improve Outcome: Progressing   Problem: Safety: Goal: Ability to remain free from injury will improve Outcome: Progressing   Problem: Skin Integrity: Goal: Risk for impaired skin integrity will decrease Outcome: Progressing   

## 2020-01-10 NOTE — Progress Notes (Signed)
Family Medicine Teaching Service Daily Progress Note Intern Pager: [phone]  Patient name: Todd Tran Medical record number: [id number] Date of birth: November 24, 1960 Age: 59 y.o. 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 Able to nod in response to my questions but did not speak this morning.  S/p Cefepime (5/16-5/18). Received chlorpromazine and morphine yesterday. -Continue comfort care measures including Ativan, morphine, chlorpromazine, Dilaudid, Zofran  -Continue ciprofloxacin 500 mg once daily -No labs -Aiming to transfer patient home with home hospice/[facility], awaiting decision from social work and case management -Encourage PO intake if possible  Disposition: Comfort care, possibly home with home hospice/[facility]  Subjective:  No acute events overnight.   Objective: Temp:  [97.7 F (36.5 C)] 97.7 F (36.5 C) (05/18 2252) Pulse Rate:  [95-98] 95 (05/18 2252) Resp:  [16-20] 20 (05/18 2252) BP: (77-82)/(43-49) 82/49 (05/18 2252) SpO2:  [95 %-99 %] 95 % (05/18 2252)  Physical Exam: General: sleepy but rousable, no acute distress Cardio: Normal S1 and S2, RRR Pulm: CTAB, normal WOB Abdomen: Bowel sounds normal. Abdomen more distended compared to previous exam. generalised tenderness, no guarding.  Extremities: No peripheral edema. Warm/ well perfused.   Laboratory: Recent Labs  Lab 01/06/20 1019  WBC 20.2*  HGB 10.8*  HCT 30.8*  PLT 181   Recent Labs  Lab 01/06/20 1019  NA 125*  K 3.7  CL 76*  CO2 31  BUN 57*  CREATININE 4.51*  CALCIUM 8.7*  PROT 6.9  BILITOT 14.5*   ALKPHOS 86  ALT 36  AST 86*  GLUCOSE 121*     Imaging/Diagnostic Tests: DG Chest Portable 1 View  Result Date: 01/06/2020 CLINICAL DATA:  Altered mental status. EXAM: PORTABLE CHEST 1 VIEW COMPARISON:  06/15/2019 FINDINGS: The heart size and mediastinal contours are within normal limits. Both lungs are clear. The visualized skeletal structures are unremarkable. IMPRESSION: No active disease. Electronically Signed   By: Signa Kell M.D.   On: 01/06/2020 11:02   US Abdomen Limited RUQ  Result Date: 01/05/2020 CLINICAL DATA:  History of alcohol use, possible cirrhosis. EXAM: ULTRASOUND ABDOMEN LIMITED RIGHT UPPER QUADRANT COMPARISON:  None. FINDINGS: Gallbladder: Gallbladder is well distended with gallbladder sludge and cholelithiasis. No significant wall thickening is noted. Mild surrounding ascites is seen. Common bile duct: Diameter: 5 mm. Liver: Nodular contour is noted with diffuse heterogeneity and increased echogenicity consistent with underlying cirrhosis. Portal vein is difficult to visualize likely related to some attenuation related to the underlying cirrhosis. Other: Moderate ascites is noted. IMPRESSION: Changes consistent with cirrhosis and portal hypertension with associated ascites. The portal vein is difficult to visualize and likely attenuated due to the underlying cirrhosis. Cholelithiasis and gallbladder sludge without complicating factors. Electronically Signed   By: Alcide Clever M.D.   On: 01/05/2020 16:16    Towanda Octave, MD 01/10/2020, 7:51 AM PGY-1, [place] [facility] FPTS Intern pager: [phone], text pages welcome

## 2020-01-10 NOTE — Discharge Summary (Addendum)
Family Medicine Teaching [facility] Discharge Summary  Patient name: Todd Tran Medical record number: [id number] Date of birth: 06-Dec-1960 Age: 59 y.o. Gender: male Date of Admission: 01/06/2020  Date of Discharge: 01/10/20 Admitting Physician: Shirlean Mylar, MD  Primary Care Provider: Myrene Buddy, MD Consultants: none  Indication for Hospitalization:  Hepatic encephalopathy, made comfort care  Discharge Diagnoses/Problem List:  End-stage liver disease Acute renal failure Acute metabolic toxic encephalopathy  Disposition: [facility]  Discharge Condition: Stable for discharge  Discharge Exam:  General: sleepy but rousable, no acute distress Cardio: Normal S1 and S2, RRR Pulm: CTAB, normal WOB Abdomen: Bowel sounds normal. Abdomen more distended compared to previous exam. generalised tenderness, no guarding.  Extremities: No peripheral edema. Warm/ well perfused.   Brief Hospital Course:   Todd Tran is a 59 year old male who presented with altered mental status. He was found to be encephalopathic secondary to end-stage liver disease and also had acute renal failure. After discussion with family members patient was made comfort car soon after admission. During his hospital stay he was given cefepime for treatment of SBP. On examination his abdominal distention and abdominal tenderness worsened throughout hospital stay.  He should continue on ciprofloxacin 500 mg once daily for SBP prophylaxis/treatment. He is now being discharged to the hospice [facility].   Issues for Follow Up:  1. Comfort care  Significant Procedures:  None   Significant Labs and Imaging:  Recent Labs  Lab 01/06/20 1019  WBC 20.2*  HGB 10.8*  HCT 30.8*  PLT 181   Recent Labs  Lab 01/06/20 1019  NA 125*  K 3.7  CL 76*  CO2 31  GLUCOSE 121*  BUN 57*  CREATININE 4.51*  CALCIUM 8.7*  MG 2.1  ALKPHOS 86  AST 86*  ALT 36  ALBUMIN 1.9*    Results/Tests Pending at Time of  Discharge:    Discharge Medications:  Allergies as of 01/10/2020   No Known Allergies     Medication List    STOP taking these medications   folic acid 1 MG tablet Commonly known as: FOLVITE   furosemide 20 MG tablet Commonly known as: LASIX   Lactulose 20 GM/30ML Soln   Magnesium 400 MG Tabs   magnesium gluconate 30 MG tablet Commonly known as: MAGONATE   metoCLOPramide 10 MG tablet Commonly known as: REGLAN   pantoprazole 20 MG tablet Commonly known as: PROTONIX   Potassium Chloride ER 20 MEQ Tbcr   spironolactone 50 MG tablet Commonly known as: ALDACTONE   thiamine 100 MG tablet     TAKE these medications   ciprofloxacin 500 MG tablet Commonly known as: CIPRO Take 1 tablet (500 mg total) by mouth daily. Start taking on: Jan 11, 2020       Discharge Instructions: Please refer to Patient Instructions section of EMR for full details.  Patient was counseled important signs and symptoms that should prompt return to medical care, changes in medications, dietary instructions, activity restrictions, and follow up appointments.   Follow-Up Appointments:   Towanda Octave, MD 01/10/2020, 12:01 PM PGY-1, [facility]

## 2020-01-10 NOTE — TOC Transition Note (Signed)
Transition of Care [facility]) - CM/SW Discharge Note   Patient Details  Name: Todd Tran MRN: [id number] Date of Birth: 1961-02-13  Transition of Care [facility]) CM/SW Contact:  Erin Sons, LCSW Phone Number: 01/10/2020, 12:42 PM   Clinical Narrative:     [facility] Anticipated DC date: 01/10/20 Family notified: [name] and [name], [name]  [phone] ( Transport by: Sharin Mons   Per MD patient ready for DC to [facility]. RN, patient, patient's family, and facility notified of DC. RN to call report prior to discharge ( [phone]). DC packet on chart. Ambulance transport requested for patient.   CSW will sign off for now as social work intervention is no longer needed. Please consult [place] again if new needs arise.   Final next level of care: Hospice Medical Facility Barriers to Discharge: No Barriers Identified   Patient Goals and CMS Choice        Discharge Placement                Patient to be transferred to facility by: [facility] Name of family member notified: [name], [name] [phone]  [name] (Son) Patient and family notified of of transfer: 01/10/20  Discharge Plan and Services                                     Social Determinants of Health (SDOH) Interventions     Readmission Risk Interventions No flowsheet data found.

## 2020-01-10 NOTE — TOC Progression Note (Signed)
Transition of Care [facility]) - Progression Note    Patient Details  Name: Todd Tran MRN: [id number] Date of Birth: 05-Jul-1961  Transition of Care [facility]) CM/SW Contact  Jhalen Eley Aris Lot, [place] Phone Number: 01/10/2020, 10:14 AM  Clinical Narrative:     Spoke with Niece Tywanda at bedside and son Greggory Stallion over the phone while bedside. Family does want [employer]. Referral made to [facility].        Expected Discharge Plan and Services                                                 Social Determinants of Health (SDOH) Interventions    Readmission Risk Interventions No flowsheet data found.

## 2020-01-10 NOTE — Progress Notes (Signed)
Manufacturing systems engineer [facility]) Hospital Liaison note.     Received request from [facility] manager Laurette Schimke, CSW  for family interest in [facility]. Chart reviewed and eligibility confirmed. Spoke with family to confirm interest and explain services. Family agreeable to transfer today. TOC aware.     [facility] will notify TOC when registration paperwork has been completed to arrange transport.   RN please call report to [phone].   Thank you,     Elsie Saas, RN, CCM       [facility] Liaison (listed on AMION under Hospice[facility])     [phone]

## 2020-01-11 LAB — CULTURE, BLOOD (ROUTINE X 2)
Culture: NO GROWTH
Culture: NO GROWTH
Special Requests: ADEQUATE

## 2020-01-12 ENCOUNTER — Ambulatory Visit: Payer: Medicaid Other | Admitting: Family Medicine

## 2020-01-12 ENCOUNTER — Telehealth: Payer: Self-pay | Admitting: *Deleted

## 2020-01-12 NOTE — Telephone Encounter (Signed)
Contacted pt niece to inform them that they did not need to come in for this visit and she said that pt has passed this morning.  I told her that I would let the doctor know.April Zimmerman Rumple, CMA

## 2020-01-12 NOTE — Progress Notes (Deleted)
    SUBJECTIVE:   CHIEF COMPLAINT / HPI:   ***  PERTINENT  PMH / PSH: ***  OBJECTIVE:   There were no vitals taken for this visit.   General: NAD, pleasant, able to participate in exam Cardiac: RRR, no murmurs. Respiratory: CTAB, normal effort, No wheezes, rales or rhonchi Abdomen: Bowel sounds present, nontender, nondistended, no hepatosplenomegaly. Extremities: no edema or cyanosis. Skin: warm and dry, no rashes noted Neuro: alert, no obvious focal deficits Psych: Normal affect and mood  ASSESSMENT/PLAN:   No problem-specific Assessment & Plan notes found for this encounter.     Laiya Wisby, DO  [facility]   

## 2020-01-23 DEATH — deceased

## 2020-11-27 IMAGING — DX DG CHEST 1V PORT
1 series · 1 of 1 positions shown · non-contrast
Comparison: 01/20/2013

CLINICAL DATA: Cough abdominal pain vomiting

EXAM:
PORTABLE CHEST 1 VIEW

[chest]
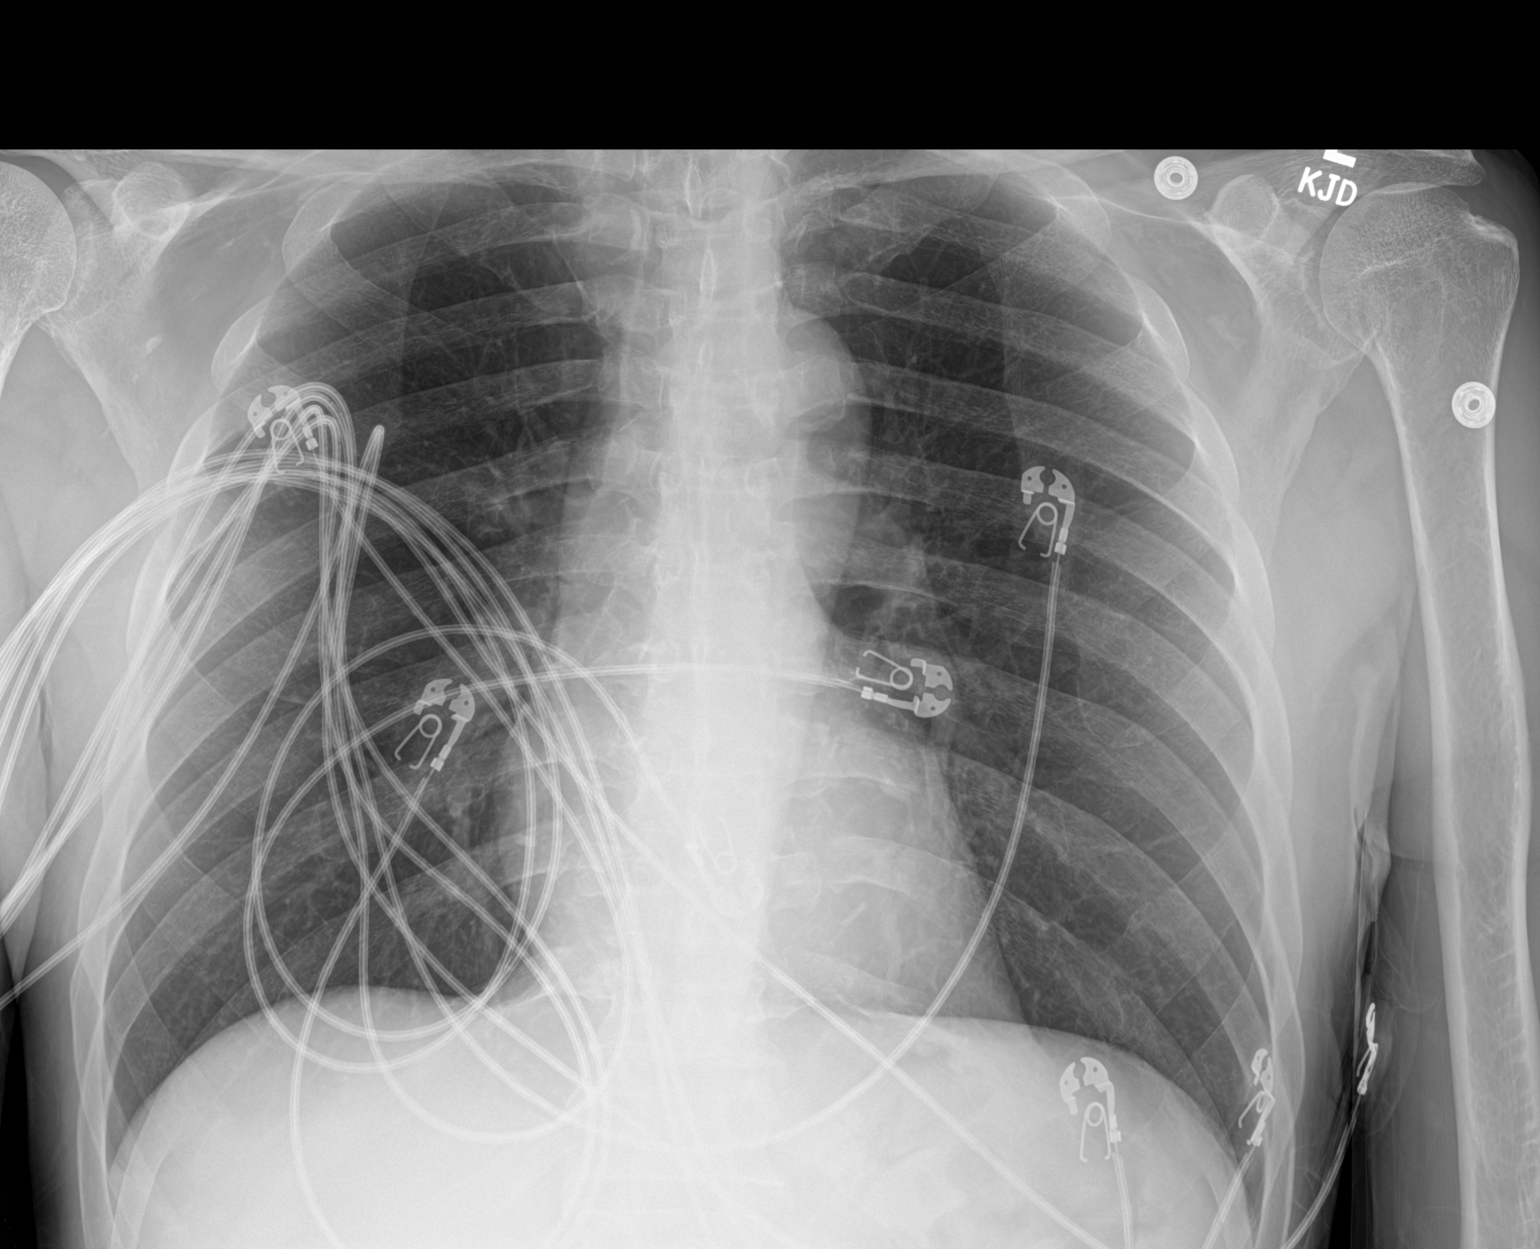

[1 of 1 positions shown; findings below may reference images not displayed]

FINDINGS: The heart size and mediastinal contours are within normal limits.
Both lungs are clear. The visualized skeletal structures are
unremarkable.
IMPRESSION: No active disease.
